# Patient Record
Sex: Female | Born: 1945 | Race: White | Hispanic: No | Smoking: Former smoker
Health system: Southern US, Community
[De-identification: ages and names within clinical notes are randomized; demographics above are authoritative.]

## PROBLEM LIST (undated history)

## (undated) DIAGNOSIS — A0471 Enterocolitis due to Clostridium difficile, recurrent: Secondary | ICD-10-CM

## (undated) DIAGNOSIS — E119 Type 2 diabetes mellitus without complications: Secondary | ICD-10-CM

## (undated) DIAGNOSIS — I82409 Acute embolism and thrombosis of unspecified deep veins of unspecified lower extremity: Secondary | ICD-10-CM

## (undated) DIAGNOSIS — Z8701 Personal history of pneumonia (recurrent): Secondary | ICD-10-CM

## (undated) DIAGNOSIS — J449 Chronic obstructive pulmonary disease, unspecified: Secondary | ICD-10-CM

## (undated) DIAGNOSIS — K56609 Unspecified intestinal obstruction, unspecified as to partial versus complete obstruction: Secondary | ICD-10-CM

## (undated) DIAGNOSIS — D649 Anemia, unspecified: Secondary | ICD-10-CM

## (undated) DIAGNOSIS — J9620 Acute and chronic respiratory failure, unspecified whether with hypoxia or hypercapnia: Secondary | ICD-10-CM

## (undated) DIAGNOSIS — F329 Major depressive disorder, single episode, unspecified: Secondary | ICD-10-CM

## (undated) DIAGNOSIS — E43 Unspecified severe protein-calorie malnutrition: Secondary | ICD-10-CM

## (undated) DIAGNOSIS — F0390 Unspecified dementia without behavioral disturbance: Secondary | ICD-10-CM

## (undated) DIAGNOSIS — G8929 Other chronic pain: Secondary | ICD-10-CM

## (undated) DIAGNOSIS — F32A Depression, unspecified: Secondary | ICD-10-CM

## (undated) DIAGNOSIS — F419 Anxiety disorder, unspecified: Secondary | ICD-10-CM

## (undated) HISTORY — PX: BOWEL RESECTION: SHX1257

## (undated) HISTORY — PX: VENTRAL HERNIA REPAIR: SHX424

## (undated) HISTORY — PX: OTHER SURGICAL HISTORY: SHX169

---

## 2015-06-24 HISTORY — PX: INCISIONAL HERNIA REPAIR: SHX193

## 2015-06-24 HISTORY — PX: HEMICOLECTOMY: SHX854

## 2015-07-14 HISTORY — PX: PEG TUBE PLACEMENT: SUR1034

## 2015-07-14 HISTORY — PX: TRACHEOSTOMY: SUR1362

## 2015-09-26 ENCOUNTER — Inpatient Hospital Stay (HOSPITAL_COMMUNITY)
Admission: EM | Admit: 2015-09-26 | Discharge: 2015-10-01 | DRG: 870 | Disposition: A | Discharge status: Other Acute Inpatient Hospital | Payer: Medicare Other | Attending: Internal Medicine | Admitting: Internal Medicine

## 2015-09-26 ENCOUNTER — Encounter (HOSPITAL_COMMUNITY): Payer: Self-pay | Admitting: Emergency Medicine

## 2015-09-26 ENCOUNTER — Emergency Department (HOSPITAL_COMMUNITY): Payer: Medicare Other

## 2015-09-26 DIAGNOSIS — K5669 Other intestinal obstruction: Secondary | ICD-10-CM | POA: Diagnosis present

## 2015-09-26 DIAGNOSIS — L89154 Pressure ulcer of sacral region, stage 4: Secondary | ICD-10-CM

## 2015-09-26 DIAGNOSIS — R7881 Bacteremia: Secondary | ICD-10-CM | POA: Diagnosis not present

## 2015-09-26 DIAGNOSIS — B957 Other staphylococcus as the cause of diseases classified elsewhere: Secondary | ICD-10-CM | POA: Diagnosis not present

## 2015-09-26 DIAGNOSIS — E119 Type 2 diabetes mellitus without complications: Secondary | ICD-10-CM | POA: Diagnosis present

## 2015-09-26 DIAGNOSIS — J9612 Chronic respiratory failure with hypercapnia: Secondary | ICD-10-CM | POA: Diagnosis not present

## 2015-09-26 DIAGNOSIS — B965 Pseudomonas (aeruginosa) (mallei) (pseudomallei) as the cause of diseases classified elsewhere: Secondary | ICD-10-CM | POA: Diagnosis present

## 2015-09-26 DIAGNOSIS — F418 Other specified anxiety disorders: Secondary | ICD-10-CM | POA: Diagnosis not present

## 2015-09-26 DIAGNOSIS — I10 Essential (primary) hypertension: Secondary | ICD-10-CM | POA: Diagnosis present

## 2015-09-26 DIAGNOSIS — R1310 Dysphagia, unspecified: Secondary | ICD-10-CM

## 2015-09-26 DIAGNOSIS — N139 Obstructive and reflux uropathy, unspecified: Secondary | ICD-10-CM | POA: Diagnosis present

## 2015-09-26 DIAGNOSIS — Z931 Gastrostomy status: Secondary | ICD-10-CM | POA: Diagnosis not present

## 2015-09-26 DIAGNOSIS — J449 Chronic obstructive pulmonary disease, unspecified: Secondary | ICD-10-CM | POA: Diagnosis present

## 2015-09-26 DIAGNOSIS — Z87891 Personal history of nicotine dependence: Secondary | ICD-10-CM | POA: Diagnosis not present

## 2015-09-26 DIAGNOSIS — D638 Anemia in other chronic diseases classified elsewhere: Secondary | ICD-10-CM | POA: Diagnosis not present

## 2015-09-26 DIAGNOSIS — J440 Chronic obstructive pulmonary disease with acute lower respiratory infection: Secondary | ICD-10-CM | POA: Diagnosis not present

## 2015-09-26 DIAGNOSIS — Z885 Allergy status to narcotic agent status: Secondary | ICD-10-CM

## 2015-09-26 DIAGNOSIS — Z9911 Dependence on respirator [ventilator] status: Secondary | ICD-10-CM

## 2015-09-26 DIAGNOSIS — J9621 Acute and chronic respiratory failure with hypoxia: Secondary | ICD-10-CM | POA: Diagnosis not present

## 2015-09-26 DIAGNOSIS — G9349 Other encephalopathy: Secondary | ICD-10-CM | POA: Diagnosis present

## 2015-09-26 DIAGNOSIS — B3749 Other urogenital candidiasis: Secondary | ICD-10-CM | POA: Diagnosis present

## 2015-09-26 DIAGNOSIS — Z79899 Other long term (current) drug therapy: Secondary | ICD-10-CM | POA: Diagnosis not present

## 2015-09-26 DIAGNOSIS — Z681 Body mass index (BMI) 19 or less, adult: Secondary | ICD-10-CM | POA: Diagnosis not present

## 2015-09-26 DIAGNOSIS — K5660 Unspecified intestinal obstruction: Secondary | ICD-10-CM | POA: Diagnosis present

## 2015-09-26 DIAGNOSIS — E876 Hypokalemia: Secondary | ICD-10-CM | POA: Diagnosis not present

## 2015-09-26 DIAGNOSIS — Z87440 Personal history of urinary (tract) infections: Secondary | ICD-10-CM

## 2015-09-26 DIAGNOSIS — G8929 Other chronic pain: Secondary | ICD-10-CM | POA: Diagnosis present

## 2015-09-26 DIAGNOSIS — K567 Ileus, unspecified: Secondary | ICD-10-CM | POA: Diagnosis not present

## 2015-09-26 DIAGNOSIS — F1120 Opioid dependence, uncomplicated: Secondary | ICD-10-CM | POA: Diagnosis not present

## 2015-09-26 DIAGNOSIS — A047 Enterocolitis due to Clostridium difficile: Secondary | ICD-10-CM | POA: Diagnosis not present

## 2015-09-26 DIAGNOSIS — F0390 Unspecified dementia without behavioral disturbance: Secondary | ICD-10-CM | POA: Diagnosis present

## 2015-09-26 DIAGNOSIS — Z515 Encounter for palliative care: Secondary | ICD-10-CM | POA: Diagnosis not present

## 2015-09-26 DIAGNOSIS — A4150 Gram-negative sepsis, unspecified: Principal | ICD-10-CM | POA: Diagnosis present

## 2015-09-26 DIAGNOSIS — A419 Sepsis, unspecified organism: Secondary | ICD-10-CM

## 2015-09-26 DIAGNOSIS — Z794 Long term (current) use of insulin: Secondary | ICD-10-CM

## 2015-09-26 DIAGNOSIS — Z8614 Personal history of Methicillin resistant Staphylococcus aureus infection: Secondary | ICD-10-CM

## 2015-09-26 DIAGNOSIS — K56609 Unspecified intestinal obstruction, unspecified as to partial versus complete obstruction: Secondary | ICD-10-CM | POA: Diagnosis present

## 2015-09-26 DIAGNOSIS — E861 Hypovolemia: Secondary | ICD-10-CM | POA: Diagnosis present

## 2015-09-26 DIAGNOSIS — X58XXXA Exposure to other specified factors, initial encounter: Secondary | ICD-10-CM | POA: Diagnosis present

## 2015-09-26 DIAGNOSIS — E43 Unspecified severe protein-calorie malnutrition: Secondary | ICD-10-CM | POA: Diagnosis present

## 2015-09-26 DIAGNOSIS — R6520 Severe sepsis without septic shock: Secondary | ICD-10-CM | POA: Diagnosis not present

## 2015-09-26 DIAGNOSIS — E872 Acidosis: Secondary | ICD-10-CM | POA: Diagnosis present

## 2015-09-26 DIAGNOSIS — J9620 Acute and chronic respiratory failure, unspecified whether with hypoxia or hypercapnia: Secondary | ICD-10-CM

## 2015-09-26 DIAGNOSIS — J690 Pneumonitis due to inhalation of food and vomit: Secondary | ICD-10-CM | POA: Diagnosis present

## 2015-09-26 DIAGNOSIS — N1339 Other hydronephrosis: Secondary | ICD-10-CM | POA: Diagnosis not present

## 2015-09-26 DIAGNOSIS — D649 Anemia, unspecified: Secondary | ICD-10-CM | POA: Diagnosis not present

## 2015-09-26 DIAGNOSIS — G9340 Encephalopathy, unspecified: Secondary | ICD-10-CM | POA: Diagnosis not present

## 2015-09-26 DIAGNOSIS — R112 Nausea with vomiting, unspecified: Secondary | ICD-10-CM

## 2015-09-26 DIAGNOSIS — Z8719 Personal history of other diseases of the digestive system: Secondary | ICD-10-CM | POA: Diagnosis not present

## 2015-09-26 DIAGNOSIS — Z930 Tracheostomy status: Secondary | ICD-10-CM | POA: Diagnosis not present

## 2015-09-26 DIAGNOSIS — Z86718 Personal history of other venous thrombosis and embolism: Secondary | ICD-10-CM

## 2015-09-26 DIAGNOSIS — T17990A Other foreign object in respiratory tract, part unspecified in causing asphyxiation, initial encounter: Secondary | ICD-10-CM | POA: Diagnosis not present

## 2015-09-26 DIAGNOSIS — N179 Acute kidney failure, unspecified: Secondary | ICD-10-CM

## 2015-09-26 DIAGNOSIS — N1330 Unspecified hydronephrosis: Secondary | ICD-10-CM | POA: Diagnosis not present

## 2015-09-26 DIAGNOSIS — Z8701 Personal history of pneumonia (recurrent): Secondary | ICD-10-CM | POA: Diagnosis not present

## 2015-09-26 DIAGNOSIS — I959 Hypotension, unspecified: Secondary | ICD-10-CM | POA: Diagnosis not present

## 2015-09-26 DIAGNOSIS — J9611 Chronic respiratory failure with hypoxia: Secondary | ICD-10-CM

## 2015-09-26 DIAGNOSIS — L8990 Pressure ulcer of unspecified site, unspecified stage: Secondary | ICD-10-CM | POA: Insufficient documentation

## 2015-09-26 DIAGNOSIS — K2210 Ulcer of esophagus without bleeding: Secondary | ICD-10-CM | POA: Diagnosis present

## 2015-09-26 DIAGNOSIS — R64 Cachexia: Secondary | ICD-10-CM | POA: Diagnosis present

## 2015-09-26 DIAGNOSIS — Z4659 Encounter for fitting and adjustment of other gastrointestinal appliance and device: Secondary | ICD-10-CM

## 2015-09-26 HISTORY — DX: Other chronic pain: G89.29

## 2015-09-26 HISTORY — DX: Acute embolism and thrombosis of unspecified deep veins of unspecified lower extremity: I82.409

## 2015-09-26 HISTORY — DX: Unspecified severe protein-calorie malnutrition: E43

## 2015-09-26 HISTORY — DX: Chronic obstructive pulmonary disease, unspecified: J44.9

## 2015-09-26 HISTORY — DX: Depression, unspecified: F32.A

## 2015-09-26 HISTORY — DX: Enterocolitis due to Clostridium difficile, recurrent: A04.71

## 2015-09-26 HISTORY — DX: Major depressive disorder, single episode, unspecified: F32.9

## 2015-09-26 HISTORY — DX: Personal history of pneumonia (recurrent): Z87.01

## 2015-09-26 HISTORY — DX: Unspecified intestinal obstruction, unspecified as to partial versus complete obstruction: K56.609

## 2015-09-26 HISTORY — DX: Acute and chronic respiratory failure, unspecified whether with hypoxia or hypercapnia: J96.20

## 2015-09-26 HISTORY — DX: Type 2 diabetes mellitus without complications: E11.9

## 2015-09-26 HISTORY — DX: Unspecified dementia, unspecified severity, without behavioral disturbance, psychotic disturbance, mood disturbance, and anxiety: F03.90

## 2015-09-26 HISTORY — DX: Anxiety disorder, unspecified: F41.9

## 2015-09-26 HISTORY — DX: Anemia, unspecified: D64.9

## 2015-09-26 LAB — COMPREHENSIVE METABOLIC PANEL
ALT: 17 U/L (ref 14–54)
ANION GAP: 17 — AB (ref 5–15)
AST: 30 U/L (ref 15–41)
Albumin: 3.2 g/dL — ABNORMAL LOW (ref 3.5–5.0)
Alkaline Phosphatase: 128 U/L — ABNORMAL HIGH (ref 38–126)
BILIRUBIN TOTAL: 0.5 mg/dL (ref 0.3–1.2)
BUN: 65 mg/dL — ABNORMAL HIGH (ref 6–20)
CHLORIDE: 93 mmol/L — AB (ref 101–111)
CO2: 30 mmol/L (ref 22–32)
Calcium: 8.8 mg/dL — ABNORMAL LOW (ref 8.9–10.3)
Creatinine, Ser: 1.43 mg/dL — ABNORMAL HIGH (ref 0.44–1.00)
GFR calc Af Amer: 42 mL/min — ABNORMAL LOW (ref >60)
GFR, EST NON AFRICAN AMERICAN: 36 mL/min — AB (ref >60)
Glucose, Bld: 198 mg/dL — ABNORMAL HIGH (ref 65–99)
POTASSIUM: 3.6 mmol/L (ref 3.5–5.1)
Sodium: 140 mmol/L (ref 135–145)
TOTAL PROTEIN: 6.8 g/dL (ref 6.5–8.1)

## 2015-09-26 LAB — BLOOD GAS, ARTERIAL
Acid-Base Excess: 6.2 mmol/L — ABNORMAL HIGH (ref 0.0–2.0)
BICARBONATE: 29.9 meq/L — AB (ref 20.0–24.0)
Drawn by: 441261
FIO2: 0.5
O2 Saturation: 90.2 %
PATIENT TEMPERATURE: 98.6
PCO2 ART: 40.4 mmHg (ref 35.0–45.0)
PEEP: 8 cmH2O
PO2 ART: 64.4 mmHg — AB (ref 80.0–100.0)
RATE: 20 resp/min
TCO2: 27.5 mmol/L (ref 0–100)
VT: 450 mL
pH, Arterial: 7.482 — ABNORMAL HIGH (ref 7.350–7.450)

## 2015-09-26 LAB — CBC
HCT: 30.9 % — ABNORMAL LOW (ref 36.0–46.0)
Hemoglobin: 9.7 g/dL — ABNORMAL LOW (ref 12.0–15.0)
MCH: 26.2 pg (ref 26.0–34.0)
MCHC: 31.4 g/dL (ref 30.0–36.0)
MCV: 83.5 fL (ref 78.0–100.0)
PLATELETS: 474 10*3/uL — AB (ref 150–400)
RBC: 3.7 MIL/uL — AB (ref 3.87–5.11)
RDW: 16.3 % — ABNORMAL HIGH (ref 11.5–15.5)
WBC: 29.3 10*3/uL — AB (ref 4.0–10.5)

## 2015-09-26 LAB — CBC WITH DIFFERENTIAL/PLATELET
BASOS ABS: 0 10*3/uL (ref 0.0–0.1)
Basophils Relative: 0 %
Eosinophils Absolute: 0 10*3/uL (ref 0.0–0.7)
Eosinophils Relative: 0 %
HEMATOCRIT: 32.1 % — AB (ref 36.0–46.0)
HEMOGLOBIN: 10 g/dL — AB (ref 12.0–15.0)
LYMPHS PCT: 5 %
Lymphs Abs: 1.3 10*3/uL (ref 0.7–4.0)
MCH: 26.1 pg (ref 26.0–34.0)
MCHC: 31.2 g/dL (ref 30.0–36.0)
MCV: 83.8 fL (ref 78.0–100.0)
Monocytes Absolute: 2.2 10*3/uL — ABNORMAL HIGH (ref 0.1–1.0)
Monocytes Relative: 10 %
NEUTROS ABS: 20.1 10*3/uL — AB (ref 1.7–7.7)
NEUTROS PCT: 85 %
PLATELETS: 568 10*3/uL — AB (ref 150–400)
RBC: 3.83 MIL/uL — AB (ref 3.87–5.11)
RDW: 16 % — ABNORMAL HIGH (ref 11.5–15.5)
WBC: 23.6 10*3/uL — AB (ref 4.0–10.5)

## 2015-09-26 LAB — TROPONIN I: Troponin I: 0.06 ng/mL — ABNORMAL HIGH (ref <0.031)

## 2015-09-26 LAB — PROTIME-INR
INR: 1.2 (ref 0.00–1.49)
Prothrombin Time: 14.9 seconds (ref 11.6–15.2)

## 2015-09-26 LAB — URINE MICROSCOPIC-ADD ON: Squamous Epithelial / LPF: NONE SEEN

## 2015-09-26 LAB — URINALYSIS, ROUTINE W REFLEX MICROSCOPIC
BILIRUBIN URINE: NEGATIVE
Glucose, UA: NEGATIVE mg/dL
Hgb urine dipstick: NEGATIVE
KETONES UR: NEGATIVE mg/dL
NITRITE: NEGATIVE
PH: 5 (ref 5.0–8.0)
Protein, ur: 100 mg/dL — AB
Specific Gravity, Urine: 1.021 (ref 1.005–1.030)

## 2015-09-26 LAB — LACTIC ACID, PLASMA: Lactic Acid, Venous: 2.1 mmol/L (ref 0.5–2.0)

## 2015-09-26 LAB — I-STAT CG4 LACTIC ACID, ED: LACTIC ACID, VENOUS: 3.92 mmol/L — AB (ref 0.5–2.0)

## 2015-09-26 LAB — OCCULT BLOOD GASTRIC / DUODENUM (SPECIMEN CUP): Occult Blood, Gastric: NEGATIVE

## 2015-09-26 LAB — MRSA PCR SCREENING: MRSA by PCR: POSITIVE — AB

## 2015-09-26 MED ORDER — IPRATROPIUM-ALBUTEROL 0.5-2.5 (3) MG/3ML IN SOLN
3.0000 mL | Freq: Four times a day (QID) | RESPIRATORY_TRACT | Status: DC
  Administered 2015-09-26 – 2015-10-01 (×21): 3 mL via RESPIRATORY_TRACT
  Filled 2015-09-26 (×21): qty 3

## 2015-09-26 MED ORDER — SODIUM CHLORIDE 0.9 % IV SOLN: 250.0000 mL | INTRAVENOUS | Status: DC | PRN

## 2015-09-26 MED ORDER — SODIUM CHLORIDE 0.9 % IV SOLN
1000.0000 mL | Freq: Once | INTRAVENOUS | Status: AC
  Administered 2015-09-26: 1000 mL via INTRAVENOUS

## 2015-09-26 MED ORDER — SODIUM CHLORIDE 0.9 % IV SOLN
INTRAVENOUS | Status: DC
Start: 2015-09-27 — End: 2015-09-27
  Administered 2015-09-27: 06:00:00 via INTRAVENOUS

## 2015-09-26 MED ORDER — IOHEXOL 300 MG/ML  SOLN
50.0000 mL | Freq: Once | INTRAMUSCULAR | Status: AC | PRN
  Administered 2015-09-26: 50 mL via INTRAVENOUS

## 2015-09-26 MED ORDER — PANTOPRAZOLE SODIUM 40 MG IV SOLR: 40.0000 mg | Freq: Two times a day (BID) | INTRAVENOUS | Status: DC

## 2015-09-26 MED ORDER — SODIUM CHLORIDE 0.9 % IV SOLN
INTRAVENOUS | Status: DC
  Administered 2015-09-26: 13:00:00 via INTRAVENOUS

## 2015-09-26 MED ORDER — CHLORHEXIDINE GLUCONATE CLOTH 2 % EX PADS
6.0000 | MEDICATED_PAD | Freq: Every day | CUTANEOUS | Status: AC
  Administered 2015-09-27 – 2015-10-01 (×5): 6 via TOPICAL

## 2015-09-26 MED ORDER — ACETAMINOPHEN 325 MG PO TABS: 650.0000 mg | ORAL_TABLET | ORAL | Status: DC | PRN

## 2015-09-26 MED ORDER — VANCOMYCIN HCL IN DEXTROSE 1-5 GM/200ML-% IV SOLN
1000.0000 mg | Freq: Once | INTRAVENOUS | Status: AC
  Administered 2015-09-26: 1000 mg via INTRAVENOUS
  Filled 2015-09-26: qty 200

## 2015-09-26 MED ORDER — ANTISEPTIC ORAL RINSE SOLUTION (CORINZ)
7.0000 mL | Freq: Four times a day (QID) | OROMUCOSAL | Status: DC
  Administered 2015-09-26 – 2015-09-30 (×15): 7 mL via OROMUCOSAL

## 2015-09-26 MED ORDER — SODIUM CHLORIDE 0.9 % IV SOLN
8.0000 mg/h | INTRAVENOUS | Status: DC
  Administered 2015-09-26 – 2015-09-27 (×3): 8 mg/h via INTRAVENOUS
  Filled 2015-09-26 (×5): qty 80

## 2015-09-26 MED ORDER — FENTANYL CITRATE (PF) 100 MCG/2ML IJ SOLN
50.0000 ug | INTRAMUSCULAR | Status: DC | PRN
  Administered 2015-09-26: 50 ug via INTRAVENOUS
  Filled 2015-09-26: qty 2

## 2015-09-26 MED ORDER — ONDANSETRON HCL 4 MG/2ML IJ SOLN: 4.0000 mg | Freq: Four times a day (QID) | INTRAMUSCULAR | Status: DC | PRN

## 2015-09-26 MED ORDER — VANCOMYCIN HCL 500 MG IV SOLR
500.0000 mg | Freq: Four times a day (QID) | Status: DC
  Administered 2015-09-26 – 2015-09-30 (×15): 500 mg via RECTAL
  Filled 2015-09-26 (×24): qty 500

## 2015-09-26 MED ORDER — SODIUM CHLORIDE 0.9 % IV BOLUS (SEPSIS)
30.0000 mL/kg | Freq: Once | INTRAVENOUS | Status: AC
  Administered 2015-09-26: 1374 mL via INTRAVENOUS

## 2015-09-26 MED ORDER — PIPERACILLIN-TAZOBACTAM 3.375 G IVPB 30 MIN
3.3750 g | Freq: Once | INTRAVENOUS | Status: AC
  Administered 2015-09-26: 3.375 g via INTRAVENOUS
  Filled 2015-09-26: qty 50

## 2015-09-26 MED ORDER — VANCOMYCIN HCL 500 MG IV SOLR
500.0000 mg | INTRAVENOUS | Status: DC
  Administered 2015-09-27 – 2015-09-29 (×3): 500 mg via INTRAVENOUS
  Filled 2015-09-26 (×3): qty 500

## 2015-09-26 MED ORDER — CHLORHEXIDINE GLUCONATE 0.12% ORAL RINSE (MEDLINE KIT)
15.0000 mL | Freq: Two times a day (BID) | OROMUCOSAL | Status: DC
  Administered 2015-09-26 – 2015-09-30 (×9): 15 mL via OROMUCOSAL

## 2015-09-26 MED ORDER — PIPERACILLIN-TAZOBACTAM 3.375 G IVPB
3.3750 g | Freq: Three times a day (TID) | INTRAVENOUS | Status: DC
  Administered 2015-09-26 – 2015-09-28 (×6): 3.375 g via INTRAVENOUS
  Filled 2015-09-26 (×6): qty 50

## 2015-09-26 MED ORDER — FENTANYL CITRATE (PF) 100 MCG/2ML IJ SOLN: 50.0000 ug | INTRAMUSCULAR | Status: DC | PRN

## 2015-09-26 MED ORDER — VANCOMYCIN 50 MG/ML ORAL SOLUTION
500.0000 mg | Freq: Four times a day (QID) | ORAL | Status: DC
  Filled 2015-09-26 (×4): qty 10

## 2015-09-26 MED ORDER — MUPIROCIN 2 % EX OINT
1.0000 "application " | TOPICAL_OINTMENT | Freq: Two times a day (BID) | CUTANEOUS | Status: AC
  Administered 2015-09-26 – 2015-10-01 (×10): 1 via NASAL
  Filled 2015-09-26: qty 22

## 2015-09-26 MED ORDER — SODIUM CHLORIDE 0.9 % IV SOLN
1000.0000 mL | INTRAVENOUS | Status: DC
  Administered 2015-09-26: 1000 mL via INTRAVENOUS

## 2015-09-26 MED ORDER — FENTANYL CITRATE (PF) 100 MCG/2ML IJ SOLN: 100.0000 ug | Freq: Once | INTRAMUSCULAR | Status: DC

## 2015-09-26 MED ORDER — DIGOXIN 125 MCG PO TABS
0.1250 mg | ORAL_TABLET | Freq: Every day | ORAL | Status: DC
  Administered 2015-09-28: 0.125 mg via ORAL
  Filled 2015-09-26 (×2): qty 1

## 2015-09-26 MED ORDER — SODIUM CHLORIDE 0.9 % IV SOLN
80.0000 mg | Freq: Once | INTRAVENOUS | Status: AC
  Administered 2015-09-26: 80 mg via INTRAVENOUS
  Filled 2015-09-26: qty 80

## 2015-09-26 NOTE — Progress Notes (Signed)
[facility] PCCM  CT results reviewed, likely small bowel obstruction.  Plan  PEG to suction Vanc enema Surgery consult  Heber Bearcreek, MD [place] PCCM Pager: [phone] Cell: [phone] After 3pm or if no response, call [phone]

## 2015-09-26 NOTE — Progress Notes (Signed)
Patient brought to [facility] ED by carelink from [facility]. Patient has a hx of COPD and is on scheduled Duoneb Q6 at facility. Patient has a Shiley #8 cuffed trach and is chronically on a vent. Patient had 2.5 L suctioned from g-tube and a small amount suctioned orally; fluid was coffee brown in nature. BBS were diminished and nothing was obtained via tracheal suctioning. Patient is currently on PRVC 450/20/+8/50%; current settings based on patient's current settings from [facility]. FiO2 was initially 55%, but was reduced due to O2 saturations of 98-100%. Patient is currently 96-98% on FiO2 of 50%. FiO2 to be weaned as tolerated by patient. Patient additionally placed on scheduled breathing treatments Q6 per facility regimen. Patient is currently comfortable on vent. RT will continue to monitor patient.

## 2015-09-26 NOTE — Consult Note (Signed)
WOC wound consult note Reason for Consult:Stage 4 pressure injury to sacrum.  Present on admission from [facility].Dark brown, foul smelling effluent from Gastrostomy tube.  Wound type: Stage 4 pressure injury Pressure Ulcer POA: Yes Measurement: 5.2 cm x 5 cm x 2.5 cm bone palpable Wound ZOX:WRUE pink nongranulating Drainage (amount, consistency, odor)moderate serosanguinous drainage.   Periwound:Intact Dressing procedure/placement/frequency:Cleanse wound with NS and pat gently dry. Gently fill wound bed with calcium alginate to fill dead space and absorb drainage.  [facility] [place] [phone]).  Cover with silicone border foam dressing.  May peel back foam dressing and replace calcium alginate daily. Replace sacral dressing every 3 days and PRN soilage.  Turn and reposition every 2 hours.  Will not follow at this time.  Please re-consult if needed.  Maple Hudson RN BSN [facility]ON Pager [phone]

## 2015-09-26 NOTE — ED Notes (Signed)
Bed: WA17 Expected date:  Expected time:  Means of arrival:  Comments: Vented pt from [facility]

## 2015-09-26 NOTE — Clinical Social Work Note (Signed)
Clinical Social Work Assessment  Patient Details  Name: Brenley Priore MRN: [id number] Date of Birth: 14-Aug-1945  Date of referral:  09/26/15               Reason for consult:  Other (Comment Required) (Patient from [facility])                Permission sought to share information with:    Permission granted to share information::     Name::        Agency::     Relationship::     Contact Information:     Housing/Transportation Living arrangements for the past 2 months:    Source of Information:    Patient Interpreter Needed:    Criminal Activity/Legal Involvement Pertinent to Current Situation/Hospitalization:    Significant Relationships:  Other(Comment) (Unknown) Lives with:    Do you feel safe going back to the place where you live?    Need for family participation in patient care:     Care giving concerns: Unknown at this time   Office manager / plan: CSW did not assess patient.   Employment status:    Insurance information:  Medicare PT Recommendations:  Not assessed at this time Information / Referral to community resources:  Other (Comment Required) (None given at this time)  Patient/Family's Response to care: Unknown at this time  Patient/Family's Understanding of and Emotional Response to Diagnosis, Current Treatment, and Prognosis: Unknown at this time  Emotional Assessment Appearance:    Attitude/Demeanor/Rapport:    Affect (typically observed):    Orientation:    Alcohol / Substance use:  Not Applicable Psych involvement (Current and /or in the community):  No (Comment)  Discharge Needs  Concerns to be addressed:    Readmission within the last 30 days:    Current discharge risk:    Barriers to Discharge:      Claudean Severance, LCSW 09/26/2015, 2:12 PM

## 2015-09-26 NOTE — Progress Notes (Signed)
eLink Physician-Brief Progress Note Patient Name: Kristen Arnold DOB: 1945-08-07 MRN: [id number]   Date of Service  09/26/2015  HPI/Events of Note  RN called regarding pt's decreasing uo. Pt has a lot of output per peg. 109/63, HR 110.   eICU Interventions  Will increase IVF to NS 150/hr x 1 L then go back to 100 ml/hr. RN to call back if uo does not get better or pt with congestion. May need bolus.      Intervention Category Major Interventions: Hypovolemia - evaluation and treatment with fluids  Louann Sjogren 09/26/2015, 7:49 PM

## 2015-09-26 NOTE — Progress Notes (Signed)
Per Dr. Nickola Major, do not place an NG/OG tube. Use existing G-tube for intermittent low wall suction.

## 2015-09-26 NOTE — Progress Notes (Signed)
RN received order from MD to place an NG tube; RN attempted to place tube but was unsuccessful. Broadus John, charge RN, attempted to place NG tube. Patient could not follow commands to swallow or bend neck forward. NG tube went into trachea instead of esophagus and was stuck. Respiratory was called, the tracheostomy cuff was deflated, and the tube was removed. RN called Dr Deterding to report failure to place NG tube. RN will continue to monitor patient.

## 2015-09-26 NOTE — H&P (Addendum)
PULMONARY / CRITICAL CARE MEDICINE   Name: Kristen Arnold MRN: [id number] DOB: 03-Jun-1946    ADMISSION DATE:  09/26/2015 CONSULTATION DATE:  09/26/2015  REFERRING MD:  Patria Mane, EDP  CHIEF COMPLAINT:  Sent from facility for aspiration and concern for bowel obstruction  HISTORY OF PRESENT ILLNESS:   70 year old female with COPD and narcotic dependence was brought to the [facility] emergency department on 09/26/2015 asked her she was found to have presumed aspiration pneumonia in the setting of a possible small bowel obstruction.  She was just admitted to [facility] on 09/22/2015 after a prolonged hospitalization. In December 2016 she was admitted from an emergency department with findings of a bowel obstruction, renal failure, and lactic acidosis. She underwent a right hemicolectomy in the setting of bowel obstruction which was felt to be related to adhesions from a prior hernia repair. Postoperatively she was unable to be weaned from a ventilator so ultimately a tracheostomy was performed. She's had several infectious complications including an enterococcus UTI, MRSA pneumonia, Pseudomonas pneumonia, and Pseudomonas related wound infection, and C. difficile. She was transferred to [facility] this week in the setting of prolonged mechanical ventilation.  On the morning of 09/26/2015 she was found to have significant brown secretions from her mouth and it was felt that she had aspiration pneumonia. A chest x-ray showed findings worrisome for pneumonia. A KUB apparently showed findings concerning for a small bowel obstruction.  In the [facility] emergency department she was noted to have brown, coffee ground bili is secretions from her G-tube. Of note, she was found to have erosive esophagitis in one of the health care facilities that cared for her prior to [facility].  She was initially hypotensive in the [facility] emergency department but she has responded to IV  fluids. Pulmonary and critical care medicine was consulted for admission.  PAST MEDICAL HISTORY :  She  has a past medical history of Bowel obstruction (HCC); Anemia; COPD (chronic obstructive pulmonary disease) (HCC); Dementia; Acute on chronic respiratory failure (HCC); Anxiety; Depression; Diabetes mellitus without complication (HCC); DVT (deep venous thrombosis) (HCC); Recurrent Clostridium difficile diarrhea; History of Pseudomonas pneumonia; and Severe protein-calorie malnutrition (HCC).  PAST SURGICAL HISTORY: She  has past surgical history that includes Tracheostomy; PEG tube placement; Bowel resection; Colon surgery; and bronchoscopies.  Allergies  Allergen Reactions  . Codeine     Unknown reaction, listed on MAR    No current facility-administered medications on file prior to encounter.   No current outpatient prescriptions on file prior to encounter.    FAMILY HISTORY/SOCIAL HISTORY/REVIEW OF SYSTEMS:   Cannot obtain because the patient is encephalopathic  SUBJECTIVE:  As above   VITAL SIGNS: BP 121/100 mmHg  Pulse 113  Temp(Src) 100.3 F (37.9 C) (Rectal)  Resp 24  Wt 101 lb (45.813 kg)  SpO2 96%  HEMODYNAMICS:    VENTILATOR SETTINGS: Vent Mode:  [-] PRVC FiO2 (%):  [55 %] 55 % Set Rate:  [20 bmp] 20 bmp Vt Set:  [450 mL] 450 mL PEEP:  [8 cmH20] 8 cmH20 Plateau Pressure:  [19 cmH20] 19 cmH20  INTAKE / OUTPUT:    PHYSICAL EXAMINATION: General:  Chronically ill appearing, on vent Neuro:  Withdraws to pain, opens eyes to pain, otherwise somnolent HEENT:  NCAT, Trach site clean, dry Cardiovascular:  RRR, no mgr Lungs:  Vent supported breaths, normal air entry Abdomen:  Tender to palpation (some guarding), BS+, PEG in place, R surgical scar axillary line well healed Musculoskeletal:  Diminished bulk, tone Skin:  Stage IV sacral decub  LABS:  BMET  Recent Labs Lab 09/26/15 1030  NA 140  K 3.6  CL 93*  CO2 30  BUN 65*  CREATININE 1.43*   GLUCOSE 198*    Electrolytes  Recent Labs Lab 09/26/15 1030  CALCIUM 8.8*    CBC  Recent Labs Lab 09/26/15 1030  WBC 23.6*  HGB 10.0*  HCT 32.1*  PLT 568*    Coag's  Recent Labs Lab 09/26/15 1030  INR 1.20    Sepsis Markers  Recent Labs Lab 09/26/15 1049  LATICACIDVEN 3.92*    ABG No results for input(s): PHART, PCO2ART, PO2ART in the last 168 hours.  Liver Enzymes  Recent Labs Lab 09/26/15 1030  AST 30  ALT 17  ALKPHOS 128*  BILITOT 0.5  ALBUMIN 3.2*    Cardiac Enzymes  Recent Labs Lab 09/26/15 1030  TROPONINI 0.06*    Glucose No results for input(s): GLUCAP in the last 168 hours.  Imaging Dg Chest Portable 1 View  09/26/2015  CLINICAL DATA:  [facility]link transfer from [facility], trach dependent, hypotensive, unresponsive EXAM: PORTABLE CHEST - 1 VIEW COMPARISON:  None available FINDINGS: Tracheostomy device projects in expected location. Left arm PICC to distal SVC. Heart size normal. Coarse prominent interstitial markings throughout both lungs. Focal left retrocardiac airspace opacity. Mild blunting of lateral costophrenic angles suggesting small effusions versus pleural scarring. No pneumothorax. Visualized skeletal structures are unremarkable. IMPRESSION: 1. Mild diffuse interstitial prominence of uncertain chronicity. 2. Left retrocardiac consolidation/atelectasis/scarring. 3. Question small pleural effusions versus pleural scarring. 4. Tracheostomy and PICC line in expected location. Electronically Signed   By: Corlis Leak M.D.   On: 09/26/2015 10:50     STUDIES:  09/26/2015 CT abdomen and pelvis  CULTURES: 2/24 Blood cultures>  February 24 urine cultures: February 24 respiratory culture:  ANTIBIOTICS: 2/24 Oral vanc >  2/24 Vanc IV >  2/24 Zosyn IV >   SIGNIFICANT EVENTS: 2/24 admission  LINES/TUBES: PICC left arm (date?) Trach (date?) PEG (date?)  DISCUSSION: 70 y/o female with COPD, and a recent prolonged hospitalization  from a large bowel obstruction leading to prolonged mechanical ventilator support and multiple infectious complications was admitted on 2/24 from [facility] with nausea/vomiting and likely aspiration.   ASSESSMENT / PLAN:  PULMONARY A: Aspiration pneumonia> Chronic respiratory failure, vent dependent COPD P:   Full vent support with setting from [facility] Trach care ABG now CXR PRN VAP prevention  CARDIOVASCULAR A:  Sinus tachycardia, no shock Lactic acidosis Sepsis P:  Tele monitoring Continue IVF Monitor lactic acid  RENAL A:   AKI? No baseline labs available P:   Monitor BMET and UOP Replace electrolytes as needed   GASTROINTESTINAL A:   Recent bowel obstruction (notes say small bowel but suspect large bowel based on R hemicolectomy) Nausea vomiting Possible GI bleed > no bright red blood and now HD stable, so doesn't appear brisk Known erosive esophagitis P:   PPI gtt NPO PEG care Hold tub feedings CT abdomen/pelvix May need surgery consult  HEMATOLOGIC A:   Leukocytosis Anemia P:  Monitor CBC in setting of possible bleed SCD for DVT prevention  INFECTIOUS A:   Aspiration pneumonia Stage IV sacral decub ulcer on admission C diff present on admission Recent MRSA pneumonia Recent Pseudomonas pneumonia Recent enterococcus UTI REcent pseudomonas wound infection (sacral) P:   Antibiotics as above WOC culture F/u culture  ENDOCRINE A:   No acute issues P:   Monitor glucose  NEUROLOGIC  A:   Acute encephalopathy> presumably due to infection Chronic narcotic dependence P:   RASS goal: 0 Remove fentanyl patch Use PAD protocol  FAMILY  - Updates: none bedside  - Inter-disciplinary family meet or Palliative Care meeting due by:  day 7  My cc time 39 minutes  Sepsis - Repeat Assessment  Performed at:    12:26 PM   Vitals     Blood pressure 121/100, pulse 113, temperature 100.3 F (37.9 C), temperature source Rectal, resp. rate 24,  weight 101 lb (45.813 kg), SpO2 96 %.  Heart:     Tachycardic  Lungs:    CTA  Capillary Refill:   <2 sec  Peripheral Pulse:   Radial pulse palpable  Skin:     Normal Color    Heber Oklee, MD [employer] PCCM Pager: [phone] Cell: [phone] After 3pm or if no response, call [phone]   09/26/2015, 12:03 PM

## 2015-09-26 NOTE — Progress Notes (Signed)
eLink Physician-Brief Progress Note Patient Name: Kristen RoseLou Irigoyen[birth year] MRN: [id number]   Date of Service  09/26/2015  HPI/Events of Note  Nurse called to verify if PEG will be placed to suction and asked re: PO vanc.   eICU Interventions  Told nurse to hook PEG to continuous suction. Told her that since pt is distended (abd), we need to switch PO vanc to PR, instructed nurse to ask pharmacy re: dose of PR avnc vanc     Intervention Category Intermediate Interventions: Medication change / dose adjustment  Kitty Cadavid Bridgette Habermann Dios 09/26/2015, 4:53 PM

## 2015-09-26 NOTE — Progress Notes (Signed)
Pharmacy Antibiotic Note  Kristen Arnold is a 70 y.o. female admitted on 09/26/2015 with sepsis.  Pharmacy has been consulted for vancomycin/Zosyn dosing.    Patient presents to the emergency department today from [facility], LTAC, for possible bowel obstruction aspiration. She was found this morning to have brown vomit coming out of her tracheostomy site. She has been at [facility] since 09/22/2015. Review the chart demonstrates that she was hospitalized in December 2016 secondary to respiratory distress and was found to also have a small bowel obstruction at that time. During her hospitalizations at the prior LTAC she is noted to have C. difficile cA chest x-ray and a KUB obtained at the [facility] prior to transfer demonstrated aspiration pneumonia as small bowel obstruction. On arrival to the emergency department her gastrostomy tube was hooked up to suction and 2 and half liters of dark coffee-ground material was suctioned from her gastrostomy tube.   Plan:  Zosyn 3.375 gr IV q8h EI  Vancomycin 1000 mg IV x1, then vancomycin 500 mg IV q12h  Weight: 101 lb (45.813 kg)  Temp (24hrs), Avg:100.3 F (37.9 C), Min:100.3 F (37.9 C), Max:100.3 F (37.9 C)   Recent Labs Lab 09/26/15 1030 09/26/15 1049  WBC 23.6*  --   CREATININE 1.43*  --   LATICACIDVEN  --  3.92*    CrCl cannot be calculated (Unknown ideal weight.).    Allergies  Allergen Reactions  . Codeine     Unknown reaction, listed on MAR    Antimicrobials this admission: 2/24 Zosyn >>  2/24 Vancomycin >>   Dose adjustments this admission:   Microbiology results: 2/24 BCx:  2/24 Urine:    Thank you for allowing pharmacy to be a part of this patient's care.  Adalberto Cole, PharmD, BCPS Pager [phone] 09/26/2015 11:46 AM

## 2015-09-26 NOTE — Progress Notes (Signed)
Patient transported to CT on full support settings and FiO2 100%. Patient tolerated transport well and no complications noted during procedure. Post procedure, patient transported to ICU room 1229. Patient remains on chronic settings per facility and ABG to be drawn. ICU RN at bedside with patient. RT will continue to monitor patient.

## 2015-09-26 NOTE — Progress Notes (Signed)
CRITICAL VALUE ALERT  Critical value received:  Gram negative rods in aerobic blood culture bottle  Date of notification:  09/26/2015  Time of notification:  2107  Critical value read back:Yes.    Nurse who received alert:  Effie Berkshire  MD notified (1st page):  Deterding  Time of first page:  2108  MD notified (2nd page):  Time of second page:  Responding MD:  Deterding  Time MD responded:  2136

## 2015-09-26 NOTE — Progress Notes (Signed)
Initial Nutrition Assessment  DOCUMENTATION CODES:   Severe malnutrition in context of chronic illness, Underweight  INTERVENTION:  - Will monitor for POC and possibility for re-starting TF during admission - RD will continue to follow  NUTRITION DIAGNOSIS:   Inadequate oral intake related to inability to eat as evidenced by NPO status.  GOAL:   Patient will meet greater than or equal to 90% of their needs  MONITOR:   Vent status, Weight trends, Labs, Skin, I & O's  REASON FOR ASSESSMENT:   Ventilator  ASSESSMENT:   70 year old female with COPD and narcotic dependence was brought to the [facility] emergency department on 09/26/2015 asked her she was found to have presumed aspiration pneumonia in the setting of a possible small bowel obstruction. She was just admitted to [facility] on 09/22/2015 after a prolonged hospitalization. In December 2016 she was admitted from an emergency department with findings of a bowel obstruction, renal failure, and lactic acidosis. She underwent a right hemicolectomy in the setting of bowel obstruction which was felt to be related to adhesions from a prior hernia repair. Postoperatively she was unable to be weaned from a ventilator so ultimately a tracheostomy was performed. She's had several infectious complications including an enterococcus UTI, MRSA pneumonia, Pseudomonas pneumonia, and Pseudomonas related wound infection, and C. difficile. She was transferred to [facility] this week in the setting of prolonged mechanical ventilation.  Pt seen for new vent. Pt with PEG and was admitted from [facility] secondary to possible SBO. No height available in the chart. Per discussion with RN, estimated height of  (160.02 cm/1.6 m). This height was used to estimate needs and BMI. Estimated BMI indicates   Patient is currently intubated on ventilator support MV: 8.9 L/min Temp (24hrs), Avg:100.3 F (37.9 C), Min:100.3 F  (37.9 C), Max:100.3 F (37.9 C)  Propofol: none  No family/visitors at bedside to provide information. Spoke with RN who reports no return with suctioning trach site since arrival to the floor. PEG now hooked to suction and with residual drainage present in tubing but nothing in the canister. RN reports 2L drained from abdomen in ED. RN reports pt was given Fentanyl in ED but no sedation being provided at this time.   No previous weight available. Severe muscle and fat wasting visualized to upper and lower body.   Will monitor POC/GOC and findings as well as monitoring for ability to restart TF during this admission. Medications reviewed. Labs reviewed; Cl: 93 mmol/L, BUN/creatinine elevated, Ca: 8.8 mg/dL, GFR: 36 mg/dL.   Diet Order:  Diet NPO time specified  Skin:  Wound (see comment) (R buttocks skin tear)  Last BM:  PTA  Height:   Ht Readings from Last 1 Encounters:  No data found for Ht    Weight:   Wt Readings from Last 1 Encounters:  09/26/15 101 lb (45.813 kg)    Ideal Body Weight:  52.27 kg (kg)  BMI:  17.89 kg/m2  Estimated Nutritional Needs:   Kcal:  1307  Protein:  55-69 grams  Fluid:  1.5 L/day  EDUCATION NEEDS:   No education needs identified at this time     Trenton Gammon, RD, LDN Inpatient Clinical Dietitian Pager # [phone] After hours/weekend pager # [phone]

## 2015-09-26 NOTE — ED Notes (Signed)
carelink transfer from [facility], pt unresponsive, trach dependant, apparent tube feeding coming from trach, hypotensive, abd taut, distended, suctioned from g-tube on arrival

## 2015-09-26 NOTE — ED Provider Notes (Addendum)
CSN: [id number]     Arrival date & time 09/26/15  1012 History   First MD Initiated Contact with Patient 09/26/15 1027     Chief Complaint  Patient presents with  . Code Sepsis      HPI Patient presents to the emergency department today from [facility], for possible bowel obstruction and aspiration.  She was found this morning to have brown vomit coming out of her tracheostomy site.  She has been at [facility] since 09/22/2015.  Review the chart demonstrates that she was hospitalized in December 2016 secondary to respiratory distress and was found to also have a small bowel obstruction at that time.  She underwent right hemicolectomy and takedown of a previous hernia repair.  She was taken to a long-term care facility after they were unable to wean her from the vent.  She ultimately ended up with a tracheostomy and a gastrostomy tube.  During her hospitalizations at the prior LTAC she is noted to have C. difficile colitis and multiple respiratory tract infections as well as enterococcus and yeast UTI.  She is currently on the ventilator and she had been receiving tube feeds through her gastrostomy tube last night.  This was discontinued today.  A chest x-ray and a KUB obtained at the [facility] prior to transfer demonstrated aspiration pneumonia as small bowel obstruction.  On arrival to the emergency department her gastrostomy tube was hooked up to suction and 2 and half liters of dark coffee-ground material was suctioned from her gastrostomy tube.  She's had no blood pressure of 70/49 a rectal temp of 100.3 She was left on intermittent suction.  Start on protonix.  Given antibiotics and treat as possible sepsis.  She will undergo CT imaging.   Past Medical History  Diagnosis Date  . Bowel obstruction [facility])    Past Surgical History  Procedure Laterality Date  . Tracheostomy     No family history on file. Social History  Substance Use Topics  . Smoking status: Not on file  .  Smokeless tobacco: Not on file  . Alcohol Use: Not on file   OB History    No data available     Review of Systems  Unable to perform ROS: Patient nonverbal      Allergies  Codeine  Home Medications   Prior to Admission medications   Medication Sig Start Date End Date Taking? Authorizing Provider  acetaminophen (TYLENOL) 325 MG tablet 650 mg by PEG Tube route every 4 (four) hours as needed for moderate pain.   Yes Historical Provider, MD  acidophilus (RISAQUAD) CAPS capsule 1 capsule by PEG Tube route 3 (three) times daily.   Yes Historical Provider, MD  bisacodyl (DULCOLAX) 10 MG suppository Place 10 mg rectally every 3 (three) days as needed for moderate constipation.   Yes Historical Provider, MD  chlorhexidine (PERIDEX) 0.12 % solution Use as directed 15 mLs in the mouth or throat 2 (two) times daily.   Yes Historical Provider, MD  clonazePAM (KLONOPIN) 0.5 MG tablet 0.5 mg by PEG Tube route every 8 (eight) hours as needed for anxiety.   Yes Historical Provider, MD  digoxin (LANOXIN) 0.125 MG tablet 0.125 mg by PEG Tube route daily.   Yes Historical Provider, MD  diltiazem (CARDIZEM) 60 MG tablet 60 mg by PEG Tube route every 6 (six) hours.   Yes Historical Provider, MD  famotidine (PEPCID) 20 MG tablet 20 mg by PEG Tube route daily.   Yes Historical Provider, MD  fentaNYL (DURAGESIC - DOSED MCG/HR) 100 MCG/HR Place 100 mcg onto the skin every 3 (three) days.   Yes Historical Provider, MD  fentaNYL (DURAGESIC - DOSED MCG/HR) 25 MCG/HR patch Place 25 mcg onto the skin every 3 (three) days.   Yes Historical Provider, MD  glucagon (GLUCAGON EMERGENCY) 1 MG injection Inject 1 mg into the vein once as needed (for hypoglycemia).   Yes Historical Provider, MD  ibuprofen (ADVIL,MOTRIN) 600 MG tablet 600 mg by PEG Tube route every 6 (six) hours as needed for fever or moderate pain.   Yes Historical Provider, MD  insulin aspart (NOVOLOG) 100 UNIT/ML injection Inject 2-10 Units into the skin  3 (three) times daily as needed for high blood sugar. Per sliding scale: 151-200= 2 units; 201-250= 4 units; 251-300= 6 units; 301-350= 8 units; 351-400= 10 units, if above 401 call MD   Yes Historical Provider, MD  ipratropium-albuterol (DUONEB) 0.5-2.5 (3) MG/3ML SOLN Take 3 mLs by nebulization every 6 (six) hours.   Yes Historical Provider, MD  Lactulose 20 GM/30ML SOLN 30 mLs by PEG Tube route daily as needed (constipation).   Yes Historical Provider, MD  magnesium oxide (MAG-OX) 400 MG tablet 400 mg by PEG Tube route 3 (three) times daily.   Yes Historical Provider, MD  metoCLOPramide (REGLAN) 5 MG tablet 5 mg by PEG Tube route every 6 (six) hours.   Yes Historical Provider, MD  metoprolol tartrate (LOPRESSOR) 25 MG tablet 25 mg by PEG Tube route every 6 (six) hours. For tachycardia & hypertension, hold of SBP < 100, hold if HR less than 55   Yes Historical Provider, MD  pantoprazole (PROTONIX) 40 MG tablet 40 mg by PEG Tube route 2 (two) times daily.   Yes Historical Provider, MD  QUEtiapine (SEROQUEL) 100 MG tablet 150 mg by PEG Tube route 2 (two) times daily.   Yes Historical Provider, MD  tobramycin (NEBCIN) 10 MG/ML SOLN injection Inject 440 mg into the vein every 36 (thirty-six) hours. For respiratory infection until 10/02/15   Yes Historical Provider, MD  tuberculin 5 UNIT/0.1ML injection Inject 5 Units into the skin every 3 (three) days. For routine diagnostic until 10/10/15   Yes Historical Provider, MD  vancomycin (VANCOCIN) 125 MG capsule 125 mg by PEG Tube route every 6 (six) hours. For C-Diff   Yes Historical Provider, MD  vitamin C (ASCORBIC ACID) 500 MG tablet 500 mg by PEG Tube route daily.   Yes Historical Provider, MD   BP 70/49 mmHg  Pulse 103  Temp(Src) 100.3 F (37.9 C) (Rectal)  Resp 24  Wt 101 lb (45.813 kg)  SpO2 100% Physical Exam  Constitutional: She is oriented to person, place, and time. She appears well-developed and well-nourished. She appears distressed.  HENT:   Head: Normocephalic and atraumatic.  Eyes: EOM are normal.  Neck:  Brown contents coming from around tracheostomy site  Cardiovascular: Regular rhythm and normal heart sounds.   Tachy   Pulmonary/Chest: Effort normal and breath sounds normal.  Abdominal: Soft. She exhibits distension. There is no tenderness.  Musculoskeletal: Normal range of motion.  Neurological: She is alert and oriented to person, place, and time.  Skin: Skin is warm and dry.  Psychiatric: She has a normal mood and affect. Judgment normal.  Nursing note and vitals reviewed.   ED Course  Procedures (including critical care time)  CRITICAL CARE Performed by: Lyanne Co Total critical care time: 40 minutes Critical care time was exclusive of separately billable procedures and treating other  patients. Critical care was necessary to treat or prevent imminent or life-threatening deterioration. Critical care was time spent personally by me on the following activities: development of treatment plan with patient and/or surrogate as well as nursing, discussions with consultants, evaluation of patient's response to treatment, examination of patient, obtaining history from patient or surrogate, ordering and performing treatments and interventions, ordering and review of laboratory studies, ordering and review of radiographic studies, pulse oximetry and re-evaluation of patient's condition.   Labs Review Labs Reviewed  CBC WITH DIFFERENTIAL/PLATELET - Abnormal; Notable for the following:    WBC 23.6 (*)    RBC 3.83 (*)    Hemoglobin 10.0 (*)    HCT 32.1 (*)    RDW 16.0 (*)    Platelets 568 (*)    Neutro Abs 20.1 (*)    Monocytes Absolute 2.2 (*)    All other components within normal limits  I-STAT CG4 LACTIC ACID, ED - Abnormal; Notable for the following:    Lactic Acid, Venous 3.92 (*)    All other components within normal limits  CULTURE, BLOOD (ROUTINE X 2)  CULTURE, BLOOD (ROUTINE X 2)  URINE CULTURE   PROTIME-INR  COMPREHENSIVE METABOLIC PANEL  URINALYSIS, ROUTINE W REFLEX MICROSCOPIC (NOT AT [facility])  TROPONIN I  OCCULT BLOOD GASTRIC / DUODENUM (SPECIMEN CUP)    Imaging Review Dg Chest Portable 1 View  09/26/2015  CLINICAL DATA:  [facility] transfer from [facility], trach dependent, hypotensive, unresponsive EXAM: PORTABLE CHEST - 1 VIEW COMPARISON:  None available FINDINGS: Tracheostomy device projects in expected location. Left arm PICC to distal SVC. Heart size normal. Coarse prominent interstitial markings throughout both lungs. Focal left retrocardiac airspace opacity. Mild blunting of lateral costophrenic angles suggesting small effusions versus pleural scarring. No pneumothorax. Visualized skeletal structures are unremarkable. IMPRESSION: 1. Mild diffuse interstitial prominence of uncertain chronicity. 2. Left retrocardiac consolidation/atelectasis/scarring. 3. Question small pleural effusions versus pleural scarring. 4. Tracheostomy and PICC line in expected location. Electronically Signed   By: Corlis Leak M.D.   On: 09/26/2015 10:50   I have personally reviewed and evaluated these images and lab results as part of my medical decision-making.   EKG Interpretation   Date/Time:  Friday September 26 2015 10:48:02 EST Ventricular Rate:  104 PR Interval:  135 QRS Duration: 77 QT Interval:  353 QTC Calculation: 464 R Axis:   75 Text Interpretation:  Sinus tachycardia Atrial premature complexes  Biatrial enlargement Repol abnrm suggests ischemia, diffuse leads No old  tracing to compare Confirmed by Tessi Eustache  MD, Caryn Bee ([id number]) on 09/26/2015  10:55:57 AM      MDM   Final diagnoses:  Small bowel obstruction (HCC)  Sepsis, due to unspecified organism (HCC)  Hypotension, unspecified hypotension type  Anemia, unspecified anemia type  Chronic respiratory failure with hypoxia and hypercapnia (HCC)    Hypotension with elevated lactate.  Could represent sepsis versus hemorrhagic shock.   Patient does have coffee-ground emesis.  Hemoglobin is 10.  This lead to be rechecked.  Gastroscope pending at this time.  Vancomycin and Zosyn given.  30 cc/kg bolus.  Started on Protonix bolus and drip for possible upper GI bleed as she does have a history of erosive esophagitis.  She does have a history of DVT but best I can tell from the medication list she is not on anticoagulation for this.  All anticoagulation will need to be held.  Clinically it sounds like she may be developing aspiration pneumonia.  She does not have excessive vent settings  at this time.  She'll be admitted to the intensive care unit.  To have liters pulled from her stomach after decompression through her gastrostomy tube.  She will remain on intermittent suction.  She will undergo CT imaging of her abdomen pelvis to evaluate for possible small bowel obstruction although clinically this sounds consistent.  She could have a gastric outlet obstruction as well.  She'll need general surgery consultation likely after CT imaging of her abdomen and pelvis.  I'll speak with the intensivist about admission at this time.  If her hypotension does not resolve after 30 cc/kg bolus she will need to be initiated on IV pressors.  Per documentation she is a full code    Azalia Bilis, MD 09/26/15 1116  Azalia Bilis, MD 10/29/15 [phone]

## 2015-09-26 NOTE — Progress Notes (Signed)
General Surgery [facility]  Called by Dr. Kendrick Fries and case discussed.  I reviewed CT scan.  Recommend placing NG tube to low intermittent suction now.  Surgery will see in consultation and follow.  Velora Heckler, MD, [facility] Office: [phone]

## 2015-09-27 ENCOUNTER — Inpatient Hospital Stay (HOSPITAL_COMMUNITY): Payer: Medicare Other

## 2015-09-27 DIAGNOSIS — D649 Anemia, unspecified: Secondary | ICD-10-CM

## 2015-09-27 DIAGNOSIS — L89154 Pressure ulcer of sacral region, stage 4: Secondary | ICD-10-CM

## 2015-09-27 DIAGNOSIS — E43 Unspecified severe protein-calorie malnutrition: Secondary | ICD-10-CM

## 2015-09-27 DIAGNOSIS — R6520 Severe sepsis without septic shock: Secondary | ICD-10-CM

## 2015-09-27 DIAGNOSIS — A419 Sepsis, unspecified organism: Secondary | ICD-10-CM

## 2015-09-27 DIAGNOSIS — J9612 Chronic respiratory failure with hypercapnia: Secondary | ICD-10-CM

## 2015-09-27 DIAGNOSIS — Z930 Tracheostomy status: Secondary | ICD-10-CM

## 2015-09-27 DIAGNOSIS — N179 Acute kidney failure, unspecified: Secondary | ICD-10-CM

## 2015-09-27 DIAGNOSIS — R7881 Bacteremia: Secondary | ICD-10-CM

## 2015-09-27 DIAGNOSIS — J9620 Acute and chronic respiratory failure, unspecified whether with hypoxia or hypercapnia: Secondary | ICD-10-CM

## 2015-09-27 DIAGNOSIS — J9621 Acute and chronic respiratory failure with hypoxia: Secondary | ICD-10-CM

## 2015-09-27 DIAGNOSIS — J9611 Chronic respiratory failure with hypoxia: Secondary | ICD-10-CM | POA: Insufficient documentation

## 2015-09-27 DIAGNOSIS — K5669 Other intestinal obstruction: Secondary | ICD-10-CM

## 2015-09-27 LAB — BASIC METABOLIC PANEL
Anion gap: 13 (ref 5–15)
BUN: 64 mg/dL — AB (ref 6–20)
CALCIUM: 8.1 mg/dL — AB (ref 8.9–10.3)
CHLORIDE: 99 mmol/L — AB (ref 101–111)
CO2: 29 mmol/L (ref 22–32)
CREATININE: 1.34 mg/dL — AB (ref 0.44–1.00)
GFR, EST AFRICAN AMERICAN: 45 mL/min — AB (ref >60)
GFR, EST NON AFRICAN AMERICAN: 39 mL/min — AB (ref >60)
Glucose, Bld: 166 mg/dL — ABNORMAL HIGH (ref 65–99)
Potassium: 2.9 mmol/L — ABNORMAL LOW (ref 3.5–5.1)
SODIUM: 141 mmol/L (ref 135–145)

## 2015-09-27 LAB — CBC
HCT: 27.2 % — ABNORMAL LOW (ref 36.0–46.0)
HEMOGLOBIN: 8.5 g/dL — AB (ref 12.0–15.0)
MCH: 26.2 pg (ref 26.0–34.0)
MCHC: 31.3 g/dL (ref 30.0–36.0)
MCV: 83.7 fL (ref 78.0–100.0)
PLATELETS: 390 10*3/uL (ref 150–400)
RBC: 3.25 MIL/uL — ABNORMAL LOW (ref 3.87–5.11)
RDW: 16.4 % — AB (ref 11.5–15.5)
WBC: 20.1 10*3/uL — ABNORMAL HIGH (ref 4.0–10.5)

## 2015-09-27 MED ORDER — HYDRALAZINE HCL 20 MG/ML IJ SOLN
10.0000 mg | INTRAMUSCULAR | Status: DC | PRN
Start: 2015-09-27 — End: 2015-09-27
  Administered 2015-09-27: 10 mg via INTRAVENOUS
  Filled 2015-09-27: qty 1

## 2015-09-27 MED ORDER — METOPROLOL TARTRATE 1 MG/ML IV SOLN
2.5000 mg | INTRAVENOUS | Status: DC | PRN
  Administered 2015-09-27: 2.5 mg via INTRAVENOUS
  Filled 2015-09-27: qty 5

## 2015-09-27 MED ORDER — FENTANYL CITRATE (PF) 100 MCG/2ML IJ SOLN
25.0000 ug | INTRAMUSCULAR | Status: DC | PRN
Start: 2015-09-27 — End: 2015-10-01
  Administered 2015-09-27 – 2015-10-01 (×4): 25 ug via INTRAVENOUS
  Filled 2015-09-27 (×4): qty 2

## 2015-09-27 MED ORDER — HYDRALAZINE HCL 20 MG/ML IJ SOLN
20.0000 mg | INTRAMUSCULAR | Status: DC | PRN
  Administered 2015-09-28 – 2015-09-30 (×3): 20 mg via INTRAVENOUS
  Filled 2015-09-27 (×3): qty 1

## 2015-09-27 MED ORDER — POTASSIUM CHLORIDE 10 MEQ/50ML IV SOLN
10.0000 meq | INTRAVENOUS | Status: AC
Start: 2015-09-27 — End: 2015-09-27
  Administered 2015-09-27 (×6): 10 meq via INTRAVENOUS
  Filled 2015-09-27 (×3): qty 50

## 2015-09-27 MED ORDER — IOHEXOL 300 MG/ML  SOLN
50.0000 mL | Freq: Once | INTRAMUSCULAR | Status: AC | PRN
  Administered 2015-09-27: 50 mL via ORAL

## 2015-09-27 MED ORDER — PANTOPRAZOLE SODIUM 40 MG IV SOLR
40.0000 mg | INTRAVENOUS | Status: DC
  Administered 2015-09-28 – 2015-10-01 (×4): 40 mg via INTRAVENOUS
  Filled 2015-09-27 (×4): qty 40

## 2015-09-27 MED ORDER — DEXTROSE-NACL 5-0.45 % IV SOLN
INTRAVENOUS | Status: DC
  Administered 2015-09-27 – 2015-09-28 (×3): via INTRAVENOUS
  Administered 2015-09-29: 75 mL via INTRAVENOUS
  Administered 2015-09-30: 04:00:00 via INTRAVENOUS

## 2015-09-27 MED ORDER — METOPROLOL TARTRATE 1 MG/ML IV SOLN
5.0000 mg | INTRAVENOUS | Status: DC | PRN
  Administered 2015-09-27 – 2015-09-28 (×2): 5 mg via INTRAVENOUS
  Filled 2015-09-27 (×2): qty 5

## 2015-09-27 NOTE — Consult Note (Signed)
General Surgery [facility]  Reason for Consult: small bowel obstruction, colonic ileus  Referring Physician: Dr. Lake Bells, CCM  Kristen Arnold is an 70 y.o. female.  HPI: Patient is a 71 year old white female in the intensive care unit, sedated, on the ventilator. General surgery is consulted by critical care medicine for evaluation of possible small bowel obstruction versus colonic ileus. Patient's past medical and surgical history is limited due to poor records. Apparently the patient underwent some type of colonic resection due to obstructive symptoms and has had a prior ventral hernia repair with mesh. Patient had complications and was ultimately transferred to a long-term acute care facility at [facility] on 09/22/2015. Patient developed aspiration pneumonia and was transferred to the critical care service here at [facility] for management. CT scan of abdomen and pelvis shows a distended stomach, dilated loops of small bowel, possible transition point in the distal small bowel, and possible colonic ileus.  Past Medical History  Diagnosis Date  . Bowel obstruction ([place])   . Anemia   . COPD (chronic obstructive pulmonary disease) ([place])   . Dementia   . Acute on chronic respiratory failure ([place])   . Anxiety   . Depression   . Diabetes mellitus without complication ([place])   . DVT (deep venous thrombosis) ([place])   . Recurrent Clostridium difficile diarrhea   . History of Pseudomonas pneumonia   . Severe protein-calorie malnutrition [facility])     Past Surgical History  Procedure Laterality Date  . Tracheostomy    . Peg tube placement    . Bowel resection      right hemicolectomy  . Colon surgery    . Bronchoscopies      History reviewed. No pertinent family history.  Social History:  reports that she has quit smoking. Her smoking use included Cigarettes. She has never used smokeless tobacco. She reports that she does not drink alcohol or use illicit  drugs.  Allergies:  Allergies  Allergen Reactions  . Codeine     Unknown reaction, listed on MAR    Medications: I have reviewed the patient's current medications.  Results for orders placed or performed during the hospital encounter of 09/26/15 (from the past 48 hour(s))  Comprehensive metabolic panel     Status: Abnormal   Collection Time: 09/26/15 10:30 AM  Result Value Ref Range   Sodium 140 135 - 145 mmol/L   Potassium 3.6 3.5 - 5.1 mmol/L   Chloride 93 (L) 101 - 111 mmol/L   CO2 30 22 - 32 mmol/L   Glucose, Bld 198 (H) 65 - 99 mg/dL   BUN 65 (H) 6 - 20 mg/dL   Creatinine, Ser 1.43 (H) 0.44 - 1.00 mg/dL   Calcium 8.8 (L) 8.9 - 10.3 mg/dL   Total Protein 6.8 6.5 - 8.1 g/dL   Albumin 3.2 (L) 3.5 - 5.0 g/dL   AST 30 15 - 41 U/L   ALT 17 14 - 54 U/L   Alkaline Phosphatase 128 (H) 38 - 126 U/L   Total Bilirubin 0.5 0.3 - 1.2 mg/dL   GFR calc non Af Amer 36 (L) >60 mL/min   GFR calc Af Amer 42 (L) >60 mL/min    Comment: (NOTE) The eGFR has been calculated using the CKD EPI equation. This calculation has not been validated in all clinical situations. eGFR's persistently <60 mL/min signify possible Chronic Kidney Disease.    Anion gap 17 (H) 5 - 15  CBC WITH DIFFERENTIAL  Status: Abnormal   Collection Time: 09/26/15 10:30 AM  Result Value Ref Range   WBC 23.6 (H) 4.0 - 10.5 K/uL   RBC 3.83 (L) 3.87 - 5.11 MIL/uL   Hemoglobin 10.0 (L) 12.0 - 15.0 g/dL   HCT 32.1 (L) 36.0 - 46.0 %   MCV 83.8 78.0 - 100.0 fL   MCH 26.1 26.0 - 34.0 pg   MCHC 31.2 30.0 - 36.0 g/dL   RDW 16.0 (H) 11.5 - 15.5 %   Platelets 568 (H) 150 - 400 K/uL   Neutrophils Relative % 85 %   Neutro Abs 20.1 (H) 1.7 - 7.7 K/uL   Lymphocytes Relative 5 %   Lymphs Abs 1.3 0.7 - 4.0 K/uL   Monocytes Relative 10 %   Monocytes Absolute 2.2 (H) 0.1 - 1.0 K/uL   Eosinophils Relative 0 %   Eosinophils Absolute 0.0 0.0 - 0.7 K/uL   Basophils Relative 0 %   Basophils Absolute 0.0 0.0 - 0.1 K/uL  Blood  Culture (routine x 2)     Status: None (Preliminary result)   Collection Time: 09/26/15 10:30 AM  Result Value Ref Range   Specimen Description BLOOD LEFT ANTECUBITAL    Special Requests BOTTLES DRAWN AEROBIC AND ANAEROBIC 6CC    Culture  Setup Time      GRAM NEGATIVE RODS AEROBIC BOTTLE ONLY CRITICAL RESULT CALLED TO, READ BACK BY AND VERIFIED WITH: A [facility] RN [id number] 5/00/37 A BROWNING Performed at [facility]    Culture PENDING    Report Status PENDING   Troponin I     Status: Abnormal   Collection Time: 09/26/15 10:30 AM  Result Value Ref Range   Troponin I 0.06 (H) <0.031 ng/mL    Comment:        PERSISTENTLY INCREASED TROPONIN VALUES IN THE RANGE OF 0.04-0.49 ng/mL CAN BE SEEN IN:       -UNSTABLE ANGINA       -CONGESTIVE HEART FAILURE       -MYOCARDITIS       -CHEST TRAUMA       -ARRYHTHMIAS       -LATE PRESENTING MYOCARDIAL INFARCTION       -COPD   CLINICAL FOLLOW-UP RECOMMENDED.   Protime-INR     Status: None   Collection Time: 09/26/15 10:30 AM  Result Value Ref Range   Prothrombin Time 14.9 11.6 - 15.2 seconds   INR 1.20 0.00 - 1.49  I-Stat CG4 Lactic Acid, ED  (not at  [facility])     Status: Abnormal   Collection Time: 09/26/15 10:49 AM  Result Value Ref Range   Lactic Acid, Venous 3.92 (HH) 0.5 - 2.0 mmol/L   Comment NOTIFIED PHYSICIAN   Urinalysis, Routine w reflex microscopic (not at [facility])     Status: Abnormal   Collection Time: 09/26/15 10:54 AM  Result Value Ref Range   Color, Urine AMBER (A) YELLOW    Comment: BIOCHEMICALS MAY BE AFFECTED BY COLOR   APPearance CLOUDY (A) CLEAR   Specific Gravity, Urine 1.021 1.005 - 1.030   pH 5.0 5.0 - 8.0   Glucose, UA NEGATIVE NEGATIVE mg/dL   Hgb urine dipstick NEGATIVE NEGATIVE   Bilirubin Urine NEGATIVE NEGATIVE   Ketones, ur NEGATIVE NEGATIVE mg/dL   Protein, ur 100 (A) NEGATIVE mg/dL   Nitrite NEGATIVE NEGATIVE   Leukocytes, UA MODERATE (A) NEGATIVE  Urine microscopic-add on     Status: Abnormal    Collection Time: 09/26/15 10:54 AM  Result Value Ref  Range   Squamous Epithelial / LPF NONE SEEN NONE SEEN   WBC, UA 0-5 0 - 5 WBC/hpf   RBC / HPF 0-5 0 - 5 RBC/hpf   Bacteria, UA FEW (A) NONE SEEN   Crystals CA OXALATE CRYSTALS (A) NEGATIVE   Urine-Other YEAST PRESENT   Blood Culture (routine x 2)     Status: None (Preliminary result)   Collection Time: 09/26/15 11:00 AM  Result Value Ref Range   Specimen Description BLOOD LEFT ARM    Special Requests BOTTLES DRAWN AEROBIC AND ANAEROBIC 6CC    Culture  Setup Time      GRAM NEGATIVE RODS AEROBIC BOTTLE ONLY CRITICAL RESULT CALLED TO, READ BACK BY AND VERIFIED WITH: A ARNOLD,RN AT 0759 09/27/15 BY L BENFIELD    Culture      GRAM NEGATIVE RODS Performed at [facility]    Report Status PENDING   Occult bld gastric/duodenum (cup to lab)     Status: None   Collection Time: 09/26/15 11:16 AM  Result Value Ref Range   pH, Gastric NOT DONE    Occult Blood, Gastric NEGATIVE NEGATIVE  MRSA PCR Screening     Status: Abnormal   Collection Time: 09/26/15  1:37 PM  Result Value Ref Range   MRSA by PCR POSITIVE (A) NEGATIVE    Comment:        The GeneXpert MRSA Assay (FDA approved for NASAL specimens only), is one component of a comprehensive MRSA colonization surveillance program. It is not intended to diagnose MRSA infection nor to guide or monitor treatment for MRSA infections. RESULT CALLED TO, READ BACK BY AND VERIFIED WITH: FREI,L RN AT 1529 ON 2.24.17 BY EPPERSON,S   Blood gas, arterial     Status: Abnormal   Collection Time: 09/26/15  1:57 PM  Result Value Ref Range   FIO2 0.50    Delivery systems VENTILATOR    Mode PRESSURE REGULATED VOLUME CONTROL    VT 450 mL   LHR 20 resp/min   Peep/cpap 8.0 cm H20   pH, Arterial 7.482 (H) 7.350 - 7.450   pCO2 arterial 40.4 35.0 - 45.0 mmHg   pO2, Arterial 64.4 (L) 80.0 - 100.0 mmHg   Bicarbonate 29.9 (H) 20.0 - 24.0 mEq/L   TCO2 27.5 0 - 100 mmol/L   Acid-Base Excess  6.2 (H) 0.0 - 2.0 mmol/L   O2 Saturation 90.2 %   Patient temperature 98.6    Collection site RIGHT RADIAL    Drawn by [id number]    Sample type ARTERIAL DRAW    Allens test (pass/fail) PASS PASS  Lactic acid, plasma     Status: Abnormal   Collection Time: 09/26/15  2:40 PM  Result Value Ref Range   Lactic Acid, Venous 2.1 (HH) 0.5 - 2.0 mmol/L    Comment: CRITICAL RESULT CALLED TO, READ BACK BY AND VERIFIED WITH: L.FREI RN AT 7616 ON 09/26/15 BY S.VANHOORNE   CBC     Status: Abnormal   Collection Time: 09/26/15  2:40 PM  Result Value Ref Range   WBC 29.3 (H) 4.0 - 10.5 K/uL   RBC 3.70 (L) 3.87 - 5.11 MIL/uL   Hemoglobin 9.7 (L) 12.0 - 15.0 g/dL   HCT 30.9 (L) 36.0 - 46.0 %   MCV 83.5 78.0 - 100.0 fL   MCH 26.2 26.0 - 34.0 pg   MCHC 31.4 30.0 - 36.0 g/dL   RDW 16.3 (H) 11.5 - 15.5 %   Platelets 474 (H) 150 - 400  K/uL  CBC     Status: Abnormal   Collection Time: 09/27/15  4:10 AM  Result Value Ref Range   WBC 20.1 (H) 4.0 - 10.5 K/uL   RBC 3.25 (L) 3.87 - 5.11 MIL/uL   Hemoglobin 8.5 (L) 12.0 - 15.0 g/dL   HCT 27.2 (L) 36.0 - 46.0 %   MCV 83.7 78.0 - 100.0 fL   MCH 26.2 26.0 - 34.0 pg   MCHC 31.3 30.0 - 36.0 g/dL   RDW 16.4 (H) 11.5 - 15.5 %   Platelets 390 150 - 400 K/uL  Basic metabolic panel     Status: Abnormal   Collection Time: 09/27/15  4:10 AM  Result Value Ref Range   Sodium 141 135 - 145 mmol/L   Potassium 2.9 (L) 3.5 - 5.1 mmol/L    Comment: DELTA CHECK NOTED REPEATED TO VERIFY    Chloride 99 (L) 101 - 111 mmol/L   CO2 29 22 - 32 mmol/L   Glucose, Bld 166 (H) 65 - 99 mg/dL   BUN 64 (H) 6 - 20 mg/dL   Creatinine, Ser 1.34 (H) 0.44 - 1.00 mg/dL   Calcium 8.1 (L) 8.9 - 10.3 mg/dL   GFR calc non Af Amer 39 (L) >60 mL/min   GFR calc Af Amer 45 (L) >60 mL/min    Comment: (NOTE) The eGFR has been calculated using the CKD EPI equation. This calculation has not been validated in all clinical situations. eGFR's persistently <60 mL/min signify possible Chronic  Kidney Disease.    Anion gap 13 5 - 15    Ct Abdomen Pelvis W Contrast  09/26/2015  CLINICAL DATA:  Vomiting, abdominal distension, history of small bowel obstruction EXAM: CT ABDOMEN AND PELVIS WITH CONTRAST TECHNIQUE: Multidetector CT imaging of the abdomen and pelvis was performed using the standard protocol following bolus administration of intravenous contrast. CONTRAST:  61m OMNIPAQUE IOHEXOL 300 MG/ML  SOLN COMPARISON:  None. FINDINGS: Sagittal images of the spine shows diffuse osteopenia. Disc space flattening with mild anterior spurring and mild posterior spurring at L5-S1 level. There is mild compression deformity upper endplate of L3 vertebral body of indeterminate age. Clinical correlation is necessary. Mild spinal canal stenosis due to posterior spurring at this level. Facet degenerative changes noted L4 and L5 level. Extensive mitral valve calcifications are noted. There is bilateral small pleural effusion. Bilateral lower lobe posterior atelectasis or infiltrate. Atherosclerotic calcifications of abdominal aorta and iliac arteries are noted. No aortic aneurysm. Postsurgical changes are noted right anterior abdominal wall. There is a percutaneous gastrostomy tube. Moderate fluid noted within stomach. No definite evidence of gastric outlet obstruction. Enhanced liver shows mild intrahepatic and extrahepatic biliary ductal dilatation. The patient is status postcholecystectomy. The pancreas, spleen and adrenal glands are unremarkable. There is moderate left hydronephrosis and significant dilatation of left extrarenal pelvis. There is significant cortical thinning left kidney probable due to chronic obstructive uropathy. There is no left hydroureter. 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 Delayed renal images shows bilateral renal delay excretion left greater than right. No any contrast material noted within kidneys or collecting system. There are multiple dilated small  bowel loops throughout the abdomen and pelvis with with fluid and air-fluid levels. Examination is markedly limited within pelvis due to beam hardening artifact from right hip prosthesis. There is probable partial right hemicolectomy. In axial image 47 in right lower quadrant there is abrupt transition of the caliber of small bowel. This is highly suspicious for small bowel obstruction  probable due to adhesion. In axial image 39 there is some gaseous mild distended colon in right lower quadrant and postsurgical changes are noted in right colonic wall in axial image 40. This may represent a anastomotic site. The remaining of the distal colon is small caliber decompressed. Findings highly suspicious for associated significant colonic ileus. There is no ascites or free air. No adenopathy. There is a Foley catheter in decompressed urinary bladder. IMPRESSION: 1. Bilateral small pleural effusion with bilateral lower lobe posterior atelectasis. 2. There are multiple dilated small bowel loops throughout the abdomen and pelvis with with fluid and air-fluid levels. Examination is markedly limited within pelvis due to beam hardening artifact from right hip prosthesis. There is probable partial right hemicolectomy. In axial image 47 in right lower quadrant there is abrupt transition of the caliber of small bowel. This is highly suspicious for small bowel obstruction probable due to adhesion. In axial image 39 there is some gaseous mild distended colon in right lower quadrant and postsurgical changes are noted in right colonic wall in axial image 40. This may represent a anastomotic site. The remaining of the distal colon is small caliber decompressed. Findings highly suspicious for associated significant colonic ileus. Less likely associated colonic obstruction 3. Status postcholecystectomy. Mild intrahepatic and extrahepatic biliary ductal dilatation. 4. Percutaneous gastrostomy tube in place. Moderate distension of proximal  stomach with fluid without there is no gastric outlet obstruction. 5. There is moderate compression deformity upper endplate of L3 vertebral body of indeterminate age. 6. There is moderate left hydronephrosis and significant dilatation of left extrarenal pelvis. Significant thinning of left renal cortex. 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 Bilateral prior renal delayed excretion left greater than right. 7. Extensive atherosclerotic calcifications of abdominal aorta and iliac arteries. 8. Foley catheter in decompressed urinary bladder. Electronically Signed   By: Lahoma Crocker M.D.   On: 09/26/2015 13:46   Dg Chest Portable 1 View  09/26/2015  CLINICAL DATA:  [facility] transfer from [facility], trach dependent, hypotensive, unresponsive EXAM: PORTABLE CHEST - 1 VIEW COMPARISON:  None available FINDINGS: Tracheostomy device projects in expected location. Left arm PICC to distal SVC. Heart size normal. Coarse prominent interstitial markings throughout both lungs. Focal left retrocardiac airspace opacity. Mild blunting of lateral costophrenic angles suggesting small effusions versus pleural scarring. No pneumothorax. Visualized skeletal structures are unremarkable. IMPRESSION: 1. Mild diffuse interstitial prominence of uncertain chronicity. 2. Left retrocardiac consolidation/atelectasis/scarring. 3. Question small pleural effusions versus pleural scarring. 4. Tracheostomy and PICC line in expected location. Electronically Signed   By: Lucrezia Europe M.D.   On: 09/26/2015 10:50    Review of Systems  Unable to perform ROS  Blood pressure 144/71, pulse 109, temperature 98.1 F (36.7 C), temperature source Axillary, resp. rate 18, height '5\' 1"'$  (1.549 m), weight 45.4 kg (100 lb 1.4 oz), SpO2 96 %. Physical Exam  Constitutional:  Thin, cachectic appearing female  HENT:  Head: Normocephalic and atraumatic.  Right Ear: External ear normal.  Left Ear: External ear  normal.  Eyes: Conjunctivae are normal. Pupils are equal, round, and reactive to light. No scleral icterus.  Neck:  Tracheostomy in place, dressing dry  Cardiovascular: Normal rate, regular rhythm and normal heart sounds.   Respiratory: She has no wheezes.  On vent; coarse BS bilat  GI: Soft. She exhibits distension (mild). She exhibits no mass. There is no tenderness. There is no rebound and no guarding.  Musculoskeletal: She exhibits no edema.  Neurological:  Opens eyes to voice  Skin: Skin is warm and dry.    Assessment/Plan: Small bowel obstruction  Patient is evaluated in the intensive care unit. She is sedated on the ventilator but does arouse to voice and stimulation. Abdominal exam is relatively benign without obvious tenderness and no palpable mass. There is palpable mesh in the upper midline of the abdomen. There are well-healed surgical incisions.  CT scan shows distention of the stomach and multiple dilated loops of small bowel. I think she would benefit from nasogastric tube with low intermittent suction for decompression.  At this time, the patient does not require urgent surgical intervention. Certainly she requires support and treatment for suspected aspiration pneumonia. Hopefully the small bowel obstruction will improve as her overall medical condition stabilizes. General surgery will follow closely. Hopefully we can avoid operative intervention.  Earnstine Regal, MD, [facility] Office: [place] 09/27/2015, 8:11 AM

## 2015-09-27 NOTE — Progress Notes (Signed)
eLink Physician-Brief Progress Note Patient Name: Kristen Arnold DOB: 04/25/1946 MRN: [id number]   Date of Service  09/27/2015  HPI/Events of Note  Hypokalemia  eICU Interventions  Potassium replaced     Intervention Category Intermediate Interventions: Electrolyte abnormality - evaluation and management  [name] 09/27/2015, 5:26 AM

## 2015-09-27 NOTE — Progress Notes (Signed)
eLink Physician-Brief Progress Note Patient Name: Kristen Arnold DOB: [birth year] MRN: [id number]   Date of Service  09/27/2015  HPI/Events of Note  Hypertension  Intake/Output Summary (Last 24 hours) at 09/27/15 2214 Last data filed at 09/27/15 2100  Gross per 24 hour  Intake 2681.67 ml  Output   2650 ml  Net  31.67 ml     eICU Interventions  Increase IV lopressor and hydralazine IV boluses prn      Intervention Category Major Interventions: Hypertension - evaluation and management  Sandrea Hughs 09/27/2015, 10:14 PM

## 2015-09-27 NOTE — Progress Notes (Signed)
CRITICAL VALUE ALERT  Critical value received:  Gram negative rods in aerobic blood culture bottle.  Date of notification: 09/27/2015  Time of notification: 0800  Critical value read back: yes  Nurse who received alert:  Lorrin Jackson RN  MD notified (1st page):  Dr. Bonney Aid  Time of first page:  0800, doctor on unit and verbalized results  MD notified (2nd page):n/a  Time of second page:n/a  Responding MD:  Dr. Bonney Aid  Time MD responded:  [phone]

## 2015-09-27 NOTE — H&P (Signed)
PULMONARY / CRITICAL CARE MEDICINE   Name: Kristen Arnold MRN: [id number] DOB: Nov 09, 1945    ADMISSION DATE:  09/26/2015 CONSULTATION DATE:  09/26/2015  REFERRING MD:  Patria Mane, EDP  CHIEF COMPLAINT:  Sent from facility for aspiration and concern for bowel obstruction  BRIEF 70 year old female with COPD and narcotic dependence. In December 2016 she was admitted from an emergency department with findings of a bowel obstruction, renal failure, and lactic acidosis. She underwent a right hemicolectomy in the setting of bowel obstruction which was felt to be related to adhesions from a prior hernia repair. Postoperatively she was unable to be weaned from a ventilator so ultimately a tracheostomy was performed. She's had several infectious complications including an enterococcus UTI, MRSA pneumonia, Pseudomonas pneumonia, and Pseudomonas related wound infection, and C. difficile. She was transferred to [facility] this week in the setting of prolonged mechanical ventilation. [facility]2015 after above prolonged hospitalization.   She was then brought to the [facility] emergency department on 09/26/2015 asked her she was found to have presumed aspiration pneumonia in the setting of a possible small bowel obstruction. On the morning of 09/26/2015 she was found to have significant brown secretions from her mouth and it was felt that she had aspiration pneumonia. A chest x-ray showed findings worrisome for pneumonia. A KUB apparently showed findings concerning for a small bowel obstruction. In the [facility] emergency department she was noted to have brown, coffee ground bili is secretions from her G-tube. Of note, she was found to have erosive esophagitis in one of the health care facilities that cared for her prior to [facility].She was initially hypotensive in the [facility] emergency department but she has responded to IV fluids. Pulmonary and  critical care medicine was consulted for admission.  LINES/TUBES: PICC left arm (date?) Trach (date?) PEG (date?)    CULTURES: 2/24 Blood cultures>  February 24 urine cultures: February 24 respiratory culture:  Results for orders placed or performed during the hospital encounter of 09/26/15  Blood Culture (routine x 2)     Status: None (Preliminary result)   Collection Time: 09/26/15 10:30 AM  Result Value Ref Range Status   Specimen Description BLOOD LEFT ANTECUBITAL  Final   Special Requests BOTTLES DRAWN AEROBIC AND ANAEROBIC 6CC  Final   Culture  Setup Time   Final    GRAM NEGATIVE RODS AEROBIC BOTTLE ONLY CRITICAL RESULT CALLED TO, READ BACK BY AND VERIFIED WITH: A [facility] RN 2108 09/26/15 A BROWNING Performed at [facility]    Culture PENDING  Incomplete   Report Status PENDING  Incomplete  MRSA PCR Screening     Status: Abnormal   Collection Time: 09/26/15  1:37 PM  Result Value Ref Range Status   MRSA by PCR POSITIVE (A) NEGATIVE Final    Comment:        The GeneXpert MRSA Assay (FDA approved for NASAL specimens only), is one component of a comprehensive MRSA colonization surveillance program. It is not intended to diagnose MRSA infection nor to guide or monitor treatment for MRSA infections. RESULT CALLED TO, READ BACK BY AND VERIFIED WITH: FREI,L RN AT 1529 ON 2.24.17 BY EPPERSON,S      ANTIBIOTICS: 2/24 Oral vanc >  2/24 Vanc IV >  2/24 Zosyn IV >   SIGNIFICANT EVENTS & STUDIES 2/24 admission 09/26/2015 CT abdomen and pelvis    SUBJECTIVE/OVERNIGHT/INTERVAL HX 09/27/15 - stage 4 5cm sacral decub noticed at admit.  Nutrition dx severe malnutrtion at admit.  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 RN could not get NG. CCS recommending IR guided NG.  GNR in aerobic bottles. RASS -4 at admit and now -3 (this is off sedation)./ Not on pressors.   VITAL SIGNS: BP 144/71 mmHg  Pulse 109  Temp(Src) 99.5 F (37.5 C) (Oral)  Resp  18  Ht 5\' 1"  (1.549 m)  Wt 45.4 kg (100 lb 1.4 oz)  BMI 18.92 kg/m2  SpO2 96%  HEMODYNAMICS:    VENTILATOR SETTINGS: Vent Mode:  [-] PRVC FiO2 (%):  [40 %-100 %] 50 % Set Rate:  [20 bmp] 20 bmp Vt Set:  [450 mL] 450 mL PEEP:  [8 cmH20] 8 cmH20 Plateau Pressure:  [19 cmH20-23 cmH20] 23 cmH20  INTAKE / OUTPUT: I/O last 3 completed shifts: In: 3027.9 [I.V.:2667.9; Other:210; IV Piggyback:150] Out: 1050 [Urine:625; Drains:250; Stool:175]  PHYSICAL EXAMINATION: General:  Chronically ill appearing, on vent Neuro:  Withdraws to pain, opens eyes to pain, otherwise somnolent. RASS -3 equivalent HEENT:  NCAT, Trach site clean, dry Cardiovascular:  RRR, no mgr Lungs:  Vent supported breaths, normal air entry Abdomen:  Tender to palpation (some guarding), BS+, PEG in place, R surgical scar axillary line well healed Musculoskeletal:  Diminished bulk, tone Skin:  Stage IV sacral decub  LABS: PULMONARY  Recent Labs Lab 09/26/15 1357  PHART 7.482*  PCO2ART 40.4  PO2ART 64.4*  HCO3 29.9*  TCO2 27.5  O2SAT 90.2    CBC  Recent Labs Lab 09/26/15 1030 09/26/15 1440 09/27/15 0410  HGB 10.0* 9.7* 8.5*  HCT 32.1* 30.9* 27.2*  WBC 23.6* 29.3* 20.1*  PLT 568* 474* 390    COAGULATION  Recent Labs Lab 09/26/15 1030  INR 1.20    CARDIAC   Recent Labs Lab 09/26/15 1030  TROPONINI 0.06*   No results for input(s): PROBNP in the last 168 hours.   CHEMISTRY  Recent Labs Lab 09/26/15 1030 09/27/15 0410  NA 140 141  K 3.6 2.9*  CL 93* 99*  CO2 30 29  GLUCOSE 198* 166*  BUN 65* 64*  CREATININE 1.43* 1.34*  CALCIUM 8.8* 8.1*   Estimated Creatinine Clearance: 28 mL/min (by C-G formula based on Cr of 1.34).   LIVER  Recent Labs Lab 09/26/15 1030  AST 30  ALT 17  ALKPHOS 128*  BILITOT 0.5  PROT 6.8  ALBUMIN 3.2*  INR 1.20     INFECTIOUS  Recent Labs Lab 09/26/15 1049 09/26/15 1440  LATICACIDVEN 3.92* 2.1*     ENDOCRINE CBG (last 3)  No  results for input(s): GLUCAP in the last 72 hours.       IMAGING x48h  - image(s) personally visualized  -   highlighted in bold Ct Abdomen Pelvis W Contrast  09/26/2015  CLINICAL DATA:  Vomiting, abdominal distension, history of small bowel obstruction EXAM: CT ABDOMEN AND PELVIS WITH CONTRAST TECHNIQUE: Multidetector CT imaging of the abdomen and pelvis was performed using the standard protocol following bolus administration of intravenous contrast. CONTRAST:  50mL OMNIPAQUE IOHEXOL 300 MG/ML  SOLN COMPARISON:  None. FINDINGS: Sagittal images of the spine shows diffuse osteopenia. Disc space flattening with mild anterior spurring and mild posterior spurring at L5-S1 level. There is mild compression deformity upper endplate of L3 vertebral body of indeterminate age. Clinical correlation is necessary. Mild spinal canal stenosis due to posterior spurring at this level. Facet degenerative changes noted L4 and L5 level. Extensive mitral valve calcifications are noted. There  is bilateral small pleural effusion. Bilateral lower lobe posterior atelectasis or infiltrate. Atherosclerotic calcifications of abdominal aorta and iliac arteries are noted. No aortic aneurysm. Postsurgical changes are noted right anterior abdominal wall. There is a percutaneous gastrostomy tube. Moderate fluid noted within stomach. No definite evidence of gastric outlet obstruction. Enhanced liver shows mild intrahepatic and extrahepatic biliary ductal dilatation. The patient is status postcholecystectomy. The pancreas, spleen and adrenal glands are unremarkable. There is moderate left hydronephrosis and significant dilatation of left extrarenal pelvis. There is significant cortical thinning left kidney probable due to chronic obstructive uropathy. There is no left hydroureter. 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 Delayed renal images shows bilateral renal delay excretion left greater than right. No any  contrast material noted within kidneys or collecting system. There are multiple dilated small bowel loops throughout the abdomen and pelvis with with fluid and air-fluid levels. Examination is markedly limited within pelvis due to beam hardening artifact from right hip prosthesis. There is probable partial right hemicolectomy. In axial image 47 in right lower quadrant there is abrupt transition of the caliber of small bowel. This is highly suspicious for small bowel obstruction probable due to adhesion. In axial image 39 there is some gaseous mild distended colon in right lower quadrant and postsurgical changes are noted in right colonic wall in axial image 40. This may represent a anastomotic site. The remaining of the distal colon is small caliber decompressed. Findings highly suspicious for associated significant colonic ileus. There is no ascites or free air. No adenopathy. There is a Foley catheter in decompressed urinary bladder. IMPRESSION: 1. Bilateral small pleural effusion with bilateral lower lobe posterior atelectasis. 2. There are multiple dilated small bowel loops throughout the abdomen and pelvis with with fluid and air-fluid levels. Examination is markedly limited within pelvis due to beam hardening artifact from right hip prosthesis. There is probable partial right hemicolectomy. In axial image 47 in right lower quadrant there is abrupt transition of the caliber of small bowel. This is highly suspicious for small bowel obstruction probable due to adhesion. In axial image 39 there is some gaseous mild distended colon in right lower quadrant and postsurgical changes are noted in right colonic wall in axial image 40. This may represent a anastomotic site. The remaining of the distal colon is small caliber decompressed. Findings highly suspicious for associated significant colonic ileus. Less likely associated colonic obstruction 3. Status postcholecystectomy. Mild intrahepatic and extrahepatic biliary  ductal dilatation. 4. Percutaneous gastrostomy tube in place. Moderate distension of proximal stomach with fluid without there is no gastric outlet obstruction. 5. There is moderate compression deformity upper endplate of L3 vertebral body of indeterminate age. 6. There is moderate left hydronephrosis and significant dilatation of left extrarenal pelvis. Significant thinning of left renal cortex. 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 Bilateral prior renal delayed excretion left greater than right. 7. Extensive atherosclerotic calcifications of abdominal aorta and iliac arteries. 8. Foley catheter in decompressed urinary bladder. Electronically Signed   By: Natasha Mead M.D.   On: 09/26/2015 13:46   Dg Chest Portable 1 View  09/26/2015  CLINICAL DATA:  [facility]link transfer from [facility], trach dependent, hypotensive, unresponsive EXAM: PORTABLE CHEST - 1 VIEW COMPARISON:  None available FINDINGS: Tracheostomy device projects in expected location. Left arm PICC to distal SVC. Heart size normal. Coarse prominent interstitial markings throughout both lungs. Focal left retrocardiac airspace opacity. Mild blunting of lateral costophrenic angles suggesting small effusions  versus pleural scarring. No pneumothorax. Visualized skeletal structures are unremarkable. IMPRESSION: 1. Mild diffuse interstitial prominence of uncertain chronicity. 2. Left retrocardiac consolidation/atelectasis/scarring. 3. Question small pleural effusions versus pleural scarring. 4. Tracheostomy and PICC line in expected location. Electronically Signed   By: Corlis Leak M.D.   On: 09/26/2015 10:50        DISCUSSION: 70 y/o female with COPD, and a recent prolonged hospitalization from a large bowel obstruction leading to prolonged mechanical ventilator support and multiple infectious complications was admitted on 2/24 from [facility] with nausea/vomiting and likely aspiration.   ASSESSMENT /  PLAN:  PULMONARY A: #Baseline  - COPD NOS  #Current Acute on Chronic Resp Failure due to COPD and chronic critical illnes S/p tracheostomy - ?Dec 2016 in [place]   P:   Full vent support with setting from [facility] Trach care ABG now CXR PRN VAP prevention  CARDIOVASCULAR A:  Sinus tachycardia, no shock Lactic acidosis Sepsis   - improving lactate P:  Tele monitoring Continue IVF - change to D5 Half Monitor lactic acid  RENAL A:   AKI? No baseline labs available CT at admit with left hydro    - AKi improving. ?Cause of hydro is strictures  P:   Urology consult paged 09/27/15 Monitor BMET and UOP Replace electrolytes as needed   GASTROINTESTINAL A:   #baseline Recent bowel obstruction dec 2016  #current - concern for UGI bleed at admit - doubt based on clinical profile at followup 09/27/15. Known erosive esophagitis - confirmed SBO at admit 09/26/2015 . 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 C diff present on admission Recent MRSA pneumonia Recent Pseudomonas pneumonia Recent enterococcus UTI REcent pseudomonas wound infection (sacral) Recent C diff  #current - Aspiration pneumonia - GNR bacteremia  P:   Antibiotics as above WOC culture F/u culture  ENDOCRINE A:   No acute issues P:   Monitor glucose  NEUROLOGIC A:   Acute encephalopathy> presumably due to infection Chronic narcotic dependence  - RASS -3 equivalent 09/27/15 without meds and this is improved per RN  P:   DC all  Opioids and benzo including PRN - allow to wake up -> then if evidence of withdrawal - reintrdouce  opioids gently + try precededx RASS goal: 0 Remove fentanyl patch  DERM A Stage 4 decub at admit 09/26/2015 P Wound care consult   Case d/w DR Gerrit Friends at bedside   FAMILY  - Updates: none bedside 09/26/15 aand 09/27/15  - Inter-disciplinary family meet or Palliative Care meeting due by:  day 7 - 10/02/15. - Her prognosis is very poor. One year mortality for someone with copd in LTAC with multiple infections and now with SBO recurrent is nearly 100%. Will call palliative care      The patient is critically ill with multiple organ systems failure and requires high complexity decision making for assessment and support, frequent evaluation and titration of therapies, application of advanced monitoring technologies and extensive interpretation of multiple databases.   Critical Care Time devoted to patient care services described in this note  is  30  Minutes. This time reflects time of care of this signee Dr Kalman Shan. This critical care time does not reflect procedure time, or teaching time or supervisory time of PA/NP/Med student/Med Resident etc but could involve care discussion time    Dr. Kalman Shan, M.D., [facility].C.P Pulmonary and Critical Care Medicine Staff Physician [facility] [place] [facility] Pager: [phone], If no answer or between  15:00h - 7:00h: call [phone]  09/27/2015 8:24 AM

## 2015-09-27 NOTE — Progress Notes (Signed)
eLink Physician-Brief Progress Note Patient Name: Kristen Arnold DOB: September 16, 1945 MRN: [id number]   Date of Service  09/27/2015  HPI/Events of Note  Hbp/ pt on lopressor prior to admit  eICU Interventions  rx lopressor IV prn      Intervention Category Major Interventions: Hypertension - evaluation and management  Sandrea Hughs 09/27/2015, 7:23 PM

## 2015-09-27 NOTE — Progress Notes (Signed)
CRITICAL VALUE ALERT  Critical value received:  Aneorbic bottle for blood culture is positive for gram - rods, and gram + cocci  Date of notification:  09/27/2015  Time of notification:  1512  Critical value read back: yes  Nurse who received alert:  Lorrin Jackson RN  MD notified (1st page):  Dr. Sherene Sires  Time of first page:  1515  MD notified (2nd page): n/a  Time of second page: n/a  Responding MD: Dr. Sherene Sires  Time MD responded:  [phone]

## 2015-09-27 NOTE — Progress Notes (Signed)
Left hdyro dw./Dr Laverle Patter who reviewed CT   Given fact creat improving and making urine and cT suggests this is all chronic - he advised expectant Rx . I helld  Off formal consult   Recent Labs Lab 09/26/15 1030 09/27/15 0410  CREATININE 1.43* 1.34*      I/O last 3 completed shifts: In: 3027.9 [I.V.:2667.9; Other:210; IV Piggyback:150] Out: 1050 [Urine:625; Drains:250; Stool:175]   Dr. Kalman Shan, M.D., [facility].C.P Pulmonary and Critical Care Medicine Staff Physician [facility] [place] [facility] Critical [facility] Pager: [phone], If no answer or between  15:00h - 7:00h: call [phone]  09/27/2015 8:32 AM

## 2015-09-27 NOTE — Progress Notes (Signed)
eLink Physician-Brief Progress Note Patient Name: Kristen Arnold DOB: 10/28/1945 MRN: [id number]   Date of Service  09/27/2015  HPI/Events of Note  Pt requesting prn pain med  eICU Interventions  fentanly 25 mcg prn     Intervention Category Minor Interventions: Routine modifications to care plan (e.g. PRN medications for pain, fever)  Sandrea Hughs 09/27/2015, 8:59 PM

## 2015-09-27 NOTE — Progress Notes (Signed)
eLink Physician-Brief Progress Note Patient Name: Kristen Arnold DOB: January 18, 1946 MRN: [id number]   Date of Service  09/27/2015  HPI/Events of Note  bp not responding to IV lopressor  eICU Interventions  Add prn IV hydralazine     Intervention Category Major Interventions: Hypertension - evaluation and management  Sandrea Hughs 09/27/2015, 8:21 PM

## 2015-09-28 DIAGNOSIS — J9620 Acute and chronic respiratory failure, unspecified whether with hypoxia or hypercapnia: Secondary | ICD-10-CM

## 2015-09-28 LAB — URINE CULTURE

## 2015-09-28 LAB — MAGNESIUM: MAGNESIUM: 1.6 mg/dL — AB (ref 1.7–2.4)

## 2015-09-28 LAB — PHOSPHORUS: PHOSPHORUS: 2 mg/dL — AB (ref 2.5–4.6)

## 2015-09-28 LAB — GLUCOSE, CAPILLARY: Glucose-Capillary: 196 mg/dL — ABNORMAL HIGH (ref 65–99)

## 2015-09-28 LAB — DIGOXIN LEVEL: Digoxin Level: 0.5 ng/mL — ABNORMAL LOW (ref 0.8–2.0)

## 2015-09-28 LAB — TROPONIN I: TROPONIN I: 0.11 ng/mL — AB (ref <0.031)

## 2015-09-28 LAB — LACTIC ACID, PLASMA: Lactic Acid, Venous: 1 mmol/L (ref 0.5–2.0)

## 2015-09-28 MED ORDER — SODIUM CHLORIDE 0.9 % IV SOLN
250.0000 mg | Freq: Three times a day (TID) | INTRAVENOUS | Status: DC
  Administered 2015-09-28 – 2015-09-29 (×3): 250 mg via INTRAVENOUS
  Filled 2015-09-28 (×4): qty 250

## 2015-09-28 MED ORDER — MAGNESIUM SULFATE 4 GM/100ML IV SOLN
4.0000 g | Freq: Once | INTRAVENOUS | Status: AC
  Administered 2015-09-28: 4 g via INTRAVENOUS
  Filled 2015-09-28: qty 100

## 2015-09-28 MED ORDER — POTASSIUM PHOSPHATES 15 MMOLE/5ML IV SOLN
10.0000 mmol | Freq: Once | INTRAVENOUS | Status: AC
  Administered 2015-09-28: 10 mmol via INTRAVENOUS
  Filled 2015-09-28: qty 3.33

## 2015-09-28 NOTE — Progress Notes (Addendum)
CSW contacted [facility] to obtain additional contact info- they had not alternate numbers in their system for pt son Sadie Haber  CSW was able to get a hold of patient son, Sadie Haber, on his cell phone to discuss the patient- Sadie Haber was very polite during interview- Gerrs phone has poor service and did cut out but Gerr was able to call back afterwards.  Gerr confirms pt is from [facility] and that plan is for patient to return when medically stable- he expresses regret that he can not care for the pt with her high level of needs now.  Gerr reports that there are no other numbers for himself- he will soon be getting a new cellphone- CSW stressed importance of providing [place] with his new number if it changes.  CSW asked about other family member involvement- he states that pt has a brother in [place] (Mr Levada Schilling?) and another son (who is Gerrs half brother) who also lives in [place]- states they care for patient and would be good alternatives if we are unable to get a hold of Gerr.  Pt brother- Collins [place]- [phone] Gerr's half brother- Barbara Cower (lives with mr Levada Schilling and can be contacted at the same number)  CSW will continue to follow for eventual return to [facility].  Merlyn Lot, LCSWA Clinical Social Worker [phone]

## 2015-09-28 NOTE — Progress Notes (Addendum)
Pharmacy Antibiotic Note  Kristen Arnold is a 70 y.o. female admitted on 09/26/2015 with sepsis.  Pharmacy has been consulted for vancomycin/Zosyn dosing.  Blood cultures reveal P. Aeruginosa and CoNS 1/2 but 2nd BC with GPC.  Pseudomonas is resistant to pip/tazo (called microbiology).  She has history of pseudomonas infections per report from [facility].  She is on vanco enemas for CDAD.   Day #3 antibiotics  Patient presents to the emergency department today from [facility], LTAC, for possible bowel obstruction aspiration.The morning of presentation found to have brown vomit coming out of her tracheostomy site. She has been at [facility] since 09/22/2015. Review the chart demonstrates that she was hospitalized in December 2016 secondary to respiratory distress and was found to also have a small bowel obstruction at that time. During her hospitalizations at the prior LTAC she is noted to have C. difficile cA chest x-ray and a KUB obtained at the [facility] prior to transfer demonstrated aspiration pneumonia as small bowel obstruction. On arrival to the emergency department her gastrostomy tube was hooked up to suction and 2 and half liters of dark coffee-ground material was suctioned from her gastrostomy tube.   Plan:  Suggest change pip/tazo to either imipenem or ciprofloxacin.  Concern with cipro would be worsening CDAD  Addendum: CCM orders to change pip/tazo to imipenem, based on weight and CrCl, start imipenem  IV q8h  Continue vancomycin 500 mg IV q24h, consider trough tuesday  Height:  (154.9 cm) Weight: 103 lb 2.8 oz (46.8 kg) IBW/kg (Calculated) : 47.8  Temp (24hrs), Avg:98.7 F (37.1 C), Min:98.2 F (36.8 C), Max:99.8 F (37.7 C)   Recent Labs Lab 09/26/15 1030 09/26/15 1049 09/26/15 1440 09/27/15 0410 09/28/15 0432  WBC 23.6*  --  29.3* 20.1*  --   CREATININE 1.43*  --   --  1.34*  --   LATICACIDVEN  --  3.92* 2.1*  --  1.0    Estimated Creatinine  Clearance: 28.9 mL/min (by C-G formula based on Cr of 1.34).    Allergies  Allergen Reactions  . Codeine     Unknown reaction, listed on MAR    Antimicrobials this admission: 2/24 Zosyn >>  2/26 2/24 Vancomycin >>  2/21 (PTA) PO vanco >> 2/24 2/24 Rectal vanco >> 2/26 imipenem >>   Dose adjustments this admission:   Microbiology results: 2/24 BCx:  P.  Aeruginosa (S to gent, cipro, imi; R to pip/taz), CoNS 1/2 but 2nd cx with GPC (susc pend) 2/24 Urine: pending 2/24 MRSA PCR: Positive 2/24 trach aspirate: pending  Thank you for allowing pharmacy to be a part of this patient's care.  Juliette Alcide, PharmD, BCPS.   Pager: [phone] 09/28/2015 10:30 AM

## 2015-09-28 NOTE — Progress Notes (Signed)
Patient ID: Kristen Arnold, female   DOB: 17-Jul-1946, 70 y.o.   MRN: [id number]  General Surgery - [facility]  HD#: 3  Subjective: Patient in ICU, vent.  Awakens to voice.  NG tube in place with minimal output.  Having liquid stools.  Objective: Vital signs in last 24 hours: Temp:  [98.2 F (36.8 C)-99.8 F (37.7 C)] 98.2 F (36.8 C) (02/26 0500) Pulse Rate:  [97-126] 112 (02/26 0700) Resp:  [18-32] 21 (02/26 0700) BP: (146-187)/(61-111) 151/61 mmHg (02/26 0700) SpO2:  [90 %-100 %] 92 % (02/26 0700) FiO2 (%):  [40 %-70 %] 40 % (02/26 0500) Weight:  [46.8 kg (103 lb 2.8 oz)] 46.8 kg (103 lb 2.8 oz) (02/26 0403) Last BM Date: 09/27/15  Intake/Output from previous day: 02/25 0701 - 02/26 0700 In: 2725 [I.V.:1825; IV Piggyback:500] Out: 3050 [Urine:1925; Drains:500; Stool:625] Intake/Output this shift:    Physical Exam: HEENT - sclerae clear, mucous membranes moist Neck - trach site clear Chest - clear bilaterally Cor - RRR Abdomen - BS present, soft, minimal distension; surgical wounds right lateral abdominal wall dry and intact  Lab Results:   Recent Labs  09/26/15 1440 09/27/15 0410  WBC 29.3* 20.1*  HGB 9.7* 8.5*  HCT 30.9* 27.2*  PLT 474* 390   BMET  Recent Labs  09/26/15 1030 09/27/15 0410  NA 140 141  K 3.6 2.9*  CL 93* 99*  CO2 30 29  GLUCOSE 198* 166*  BUN 65* 64*  CREATININE 1.43* 1.34*  CALCIUM 8.8* 8.1*   PT/INR  Recent Labs  09/26/15 1030  LABPROT 14.9  INR 1.20   Comprehensive Metabolic Panel:    Component Value Date/Time   NA 141 09/27/2015 0410   NA 140 09/26/2015 1030   K 2.9* 09/27/2015 0410   K 3.6 09/26/2015 1030   CL 99* 09/27/2015 0410   CL 93* 09/26/2015 1030   CO2 29 09/27/2015 0410   CO2 30 09/26/2015 1030   BUN 64* 09/27/2015 0410   BUN 65* 09/26/2015 1030   CREATININE 1.34* 09/27/2015 0410   CREATININE 1.43* 09/26/2015 1030   GLUCOSE 166* 09/27/2015 0410   GLUCOSE 198* 09/26/2015 1030    CALCIUM 8.1* 09/27/2015 0410   CALCIUM 8.8* 09/26/2015 1030   AST 30 09/26/2015 1030   ALT 17 09/26/2015 1030   ALKPHOS 128* 09/26/2015 1030   BILITOT 0.5 09/26/2015 1030   PROT 6.8 09/26/2015 1030   ALBUMIN 3.2* 09/26/2015 1030    Studies/Results: Ct Abdomen Pelvis W Contrast  09/26/2015  CLINICAL DATA:  Vomiting, abdominal distension, history of small bowel obstruction EXAM: CT ABDOMEN AND PELVIS WITH CONTRAST TECHNIQUE: Multidetector CT imaging of the abdomen and pelvis was performed using the standard protocol following bolus administration of intravenous contrast. CONTRAST:  50mL OMNIPAQUE IOHEXOL 300 MG/ML  SOLN COMPARISON:  None. FINDINGS: Sagittal images of the spine shows diffuse osteopenia. Disc space flattening with mild anterior spurring and mild posterior spurring at L5-S1 level. There is mild compression deformity upper endplate of L3 vertebral body of indeterminate age. Clinical correlation is necessary. Mild spinal canal stenosis due to posterior spurring at this level. Facet degenerative changes noted L4 and L5 level. Extensive mitral valve calcifications are noted. There is bilateral small pleural effusion. Bilateral lower lobe posterior atelectasis or infiltrate. Atherosclerotic calcifications of abdominal aorta and iliac arteries are noted. No aortic aneurysm. Postsurgical changes are noted right anterior abdominal wall. There is a percutaneous gastrostomy tube. Moderate fluid noted within stomach. No definite evidence of  gastric outlet obstruction. Enhanced liver shows mild intrahepatic and extrahepatic biliary ductal dilatation. The patient is status postcholecystectomy. The pancreas, spleen and adrenal glands are unremarkable. There is moderate left hydronephrosis and significant dilatation of left extrarenal pelvis. There is significant cortical thinning left kidney probable due to chronic obstructive uropathy. There is no left hydroureter. 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 Delayed renal images shows bilateral renal delay excretion left greater than right. No any contrast material noted within kidneys or collecting system. There are multiple dilated small bowel loops throughout the abdomen and pelvis with with fluid and air-fluid levels. Examination is markedly limited within pelvis due to beam hardening artifact from right hip prosthesis. There is probable partial right hemicolectomy. In axial image 47 in right lower quadrant there is abrupt transition of the caliber of small bowel. This is highly suspicious for small bowel obstruction probable due to adhesion. In axial image 39 there is some gaseous mild distended colon in right lower quadrant and postsurgical changes are noted in right colonic wall in axial image 40. This may represent a anastomotic site. The remaining of the distal colon is small caliber decompressed. Findings highly suspicious for associated significant colonic ileus. There is no ascites or free air. No adenopathy. There is a Foley catheter in decompressed urinary bladder. IMPRESSION: 1. Bilateral small pleural effusion with bilateral lower lobe posterior atelectasis. 2. There are multiple dilated small bowel loops throughout the abdomen and pelvis with with fluid and air-fluid levels. Examination is markedly limited within pelvis due to beam hardening artifact from right hip prosthesis. There is probable partial right hemicolectomy. In axial image 47 in right lower quadrant there is abrupt transition of the caliber of small bowel. This is highly suspicious for small bowel obstruction probable due to adhesion. In axial image 39 there is some gaseous mild distended colon in right lower quadrant and postsurgical changes are noted in right colonic wall in axial image 40. This may represent a anastomotic site. The remaining of the distal colon is small caliber decompressed. Findings highly suspicious for associated significant colonic  ileus. Less likely associated colonic obstruction 3. Status postcholecystectomy. Mild intrahepatic and extrahepatic biliary ductal dilatation. 4. Percutaneous gastrostomy tube in place. Moderate distension of proximal stomach with fluid without there is no gastric outlet obstruction. 5. There is moderate compression deformity upper endplate of L3 vertebral body of indeterminate age. 6. There is moderate left hydronephrosis and significant dilatation of left extrarenal pelvis. Significant thinning of left renal cortex. 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 Bilateral prior renal delayed excretion left greater than right. 7. Extensive atherosclerotic calcifications of abdominal aorta and iliac arteries. 8. Foley catheter in decompressed urinary bladder. Electronically Signed   By: Natasha Mead M.D.   On: 09/26/2015 13:46   Dg Chest Portable 1 View  09/26/2015  CLINICAL DATA:  [facility] transfer from [facility], trach dependent, hypotensive, unresponsive EXAM: PORTABLE CHEST - 1 VIEW COMPARISON:  None available FINDINGS: Tracheostomy device projects in expected location. Left arm PICC to distal SVC. Heart size normal. Coarse prominent interstitial markings throughout both lungs. Focal left retrocardiac airspace opacity. Mild blunting of lateral costophrenic angles suggesting small effusions versus pleural scarring. No pneumothorax. Visualized skeletal structures are unremarkable. IMPRESSION: 1. Mild diffuse interstitial prominence of uncertain chronicity. 2. Left retrocardiac consolidation/atelectasis/scarring. 3. Question small pleural effusions versus pleural scarring. 4. Tracheostomy and PICC line in expected location. Electronically Signed   By: Ronald Pippins.D.  On: 09/26/2015 10:50   Dg Basil Dess Tube Plc W/fl W/rad  09/27/2015  CLINICAL DATA:  70 year old with small bowel obstruction. Patient has a gastrostomy tube and surgery has requested placement of  nasogastric tube for additional decompression. EXAM: NASO G TUBE PLACEMENT WITH FL AND WITH RAD CONTRAST:  50mL OMNIPAQUE IOHEXOL 300 MG/ML  SOLN FLUOROSCOPY TIME:  Radiation Exposure Index (as provided by the fluoroscopic device): 25.67 mGy If the device does not provide the exposure index: Fluoroscopy Time (in minutes and seconds):  4 minutes and 27 seconds COMPARISON:  Abdominal CT 09/26/2015 FINDINGS: A 14 French nasogastric tube was directed into the left nostril with difficulty. The tube would not advance beyond the proximal esophagus with fluoroscopy. As a result, a 5 [place] vascular catheter and Amplatz wire were used to get access beyond the proximal esophagus. Catheter and wire were advanced into the stomach. The 14 French nasogastric tube was advanced over the wire and placed in the stomach. Approximately 10 mL of Omnipaque 300 was injected to confirm placement in the stomach. Catheter was secured to the nose. Catheter was flushed with water at end of the procedure. IMPRESSION: Successful placement of a nasogastric tube using over-the-wire technique. Review of the recent abdominal CT suggests that the gastrostomy tube traverses the lateral margin of the left hepatic lobe. This was discussed with Dr. Johna Sheriff. Electronically Signed   By: Richarda Overlie M.D.   On: 09/27/2015 12:58    Assessment & Plans: Small bowel obstruction  NG decompression with suction, PEG to drainage  NPO, IVF  Will check AXR in AM 2/27 Aspiration pneumonia Sacral decubitus ulcer  Will follow.  Monitor outputs and check AXR in AM 2/27.  Would like to see some records from outside facility regarding recent surgical procedures.  Velora Heckler, MD, [facility] Office: [phone]   Shaina Gullatt Judie Petit 09/28/2015

## 2015-09-28 NOTE — H&P (Signed)
PULMONARY / CRITICAL CARE MEDICINE   Name: Kristen Arnold MRN: [id number] DOB: [birth year]    ADMISSION DATE:  09/26/2015 CONSULTATION DATE:  09/26/2015  REFERRING MD:  Patria Mane, EDP  CHIEF COMPLAINT:  Sent from facility for aspiration and concern for bowel obstruction  BRIEF 70 year old female with COPD and narcotic dependence. In December 2016 she was admitted from an emergency department with findings of a bowel obstruction, renal failure, and lactic acidosis. She underwent a right hemicolectomy in the setting of bowel obstruction which was felt to be related to adhesions from a prior hernia repair. Postoperatively she was unable to be weaned from a ventilator so ultimately a tracheostomy was performed. She's had several infectious complications including an enterococcus UTI, MRSA pneumonia, Pseudomonas pneumonia, and Pseudomonas related wound infection, and C. difficile. She was transferred to [facility] this week in the setting of prolonged mechanical ventilation. [facility]2015 after above prolonged hospitalization.   She was then brought to the [facility] emergency department on 09/26/2015 asked her she was found to have presumed aspiration pneumonia in the setting of a possible small bowel obstruction. On the morning of 09/26/2015 she was found to have significant brown secretions from her mouth and it was felt that she had aspiration pneumonia. A chest x-ray showed findings worrisome for pneumonia. A KUB apparently showed findings concerning for a small bowel obstruction. In the [facility] emergency department she was noted to have brown, coffee ground bili is secretions from her G-tube. Of note, she was found to have erosive esophagitis in one of the health care facilities that cared for her prior to [facility].She was initially hypotensive in the [facility] emergency department but she has responded to IV fluids. Pulmonary and  critical care medicine was consulted for admission.  LINES/TUBES: PICC left arm (date?) Trach (date?) PEG (date?)    CULTURES: 2/24 Blood cultures> pseudomona R -to cephalosporins. S - cipro and imipenem February 24 urine cultures: February 24 respiratory culture:Pseudomonas 2/24 - MRSA PCR - POSITIVE     ANTIBIOTICS: 2/24 Oral vanc >  2/24 Vanc IV >  2/24 Zosyn IV >   SIGNIFICANT EVENTS & STUDIES 2/24 admission 09/26/2015 CT abdomen and pelvis  09/27/15 - stage 4 5cm sacral decub noticed at admit. Nutrition dx severe malnutrtion at admit.  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 RN could not get NG. CCS recommending IR guided NG.  GNR in aerobic bottles. RASS -4 at admit and now -3 (this is off sedation)./ Not on pressors.     SUBJECTIVE/OVERNIGHT/INTERVAL HX 09/28/15 - Ongoing diarrhea. RN says positive C Diff < 1 week ago at [facility] but no evidence ion EMR.MRSA pcr positive. Blood and urine with pseudomonas. Not on pressors. No evidence of family around.More awake  VITAL SIGNS: BP 172/68 mmHg  Pulse 112  Temp(Src) 98.4 F (36.9 C) (Axillary)  Resp 26  Ht 5\' 1"  (1.549 m)  Wt 46.8 kg (103 lb 2.8 oz)  BMI 19.50 kg/m2  SpO2 98%  HEMODYNAMICS:    VENTILATOR SETTINGS: Vent Mode:  [-] PRVC FiO2 (%):  [40 %-70 %] 40 % Set Rate:  [20 bmp] 20 bmp Vt Set:  [450 mL] 450 mL PEEP:  [8 cmH20] 8 cmH20 Plateau Pressure:  [17 cmH20-23 cmH20] 20 cmH20  INTAKE / OUTPUT: I/O last 3 completed shifts: In: 5061.7 [I.V.:3861.7; Other:600; IV Piggyback:600] Out: 3900 [Urine:2450; Drains:650; Stool:800]  PHYSICAL EXAMINATION: General:  Chronically ill appearing,  on vent. Very frail and cachetic Neuro:  Awake. Nods. Seems to understand simple questions  HEENT:  NCAT, Trach site clean, dry Cardiovascular:  RRR, no mgr Lungs:  Vent supported breaths, normal air entry Abdomen:  Tender to palpation (some guarding), BS+, PEG in place, R surgical scar axillary line well  healed Musculoskeletal:  Diminished bulk, tone Skin:  Stage IV sacral decub  LABS: PULMONARY  Recent Labs Lab 09/26/15 1357  PHART 7.482*  PCO2ART 40.4  PO2ART 64.4*  HCO3 29.9*  TCO2 27.5  O2SAT 90.2    CBC  Recent Labs Lab 09/26/15 1030 09/26/15 1440 09/27/15 0410  HGB 10.0* 9.7* 8.5*  HCT 32.1* 30.9* 27.2*  WBC 23.6* 29.3* 20.1*  PLT 568* 474* 390    COAGULATION  Recent Labs Lab 09/26/15 1030  INR 1.20    CARDIAC    Recent Labs Lab 09/26/15 1030 09/28/15 0436  TROPONINI 0.06* 0.11*   No results for input(s): PROBNP in the last 168 hours.   CHEMISTRY  Recent Labs Lab 09/26/15 1030 09/27/15 0410 09/28/15 0436  NA 140 141  --   K 3.6 2.9*  --   CL 93* 99*  --   CO2 30 29  --   GLUCOSE 198* 166*  --   BUN 65* 64*  --   CREATININE 1.43* 1.34*  --   CALCIUM 8.8* 8.1*  --   MG  --   --  1.6*  PHOS  --   --  2.0*   Estimated Creatinine Clearance: 28.9 mL/min (by C-G formula based on Cr of 1.34).   LIVER  Recent Labs Lab 09/26/15 1030  AST 30  ALT 17  ALKPHOS 128*  BILITOT 0.5  PROT 6.8  ALBUMIN 3.2*  INR 1.20     INFECTIOUS  Recent Labs Lab 09/26/15 1049 09/26/15 1440 09/28/15 0432  LATICACIDVEN 3.92* 2.1* 1.0     ENDOCRINE CBG (last 3)  No results for input(s): GLUCAP in the last 72 hours.       IMAGING x48h  - image(s) personally visualized  -   highlighted in bold Ct Abdomen Pelvis W Contrast  09/26/2015  CLINICAL DATA:  Vomiting, abdominal distension, history of small bowel obstruction EXAM: CT ABDOMEN AND PELVIS WITH CONTRAST TECHNIQUE: Multidetector CT imaging of the abdomen and pelvis was performed using the standard protocol following bolus administration of intravenous contrast. CONTRAST:  50mL OMNIPAQUE IOHEXOL 300 MG/ML  SOLN COMPARISON:  None. FINDINGS: Sagittal images of the spine shows diffuse osteopenia. Disc space flattening with mild anterior spurring and mild posterior spurring at L5-S1 level.  There is mild compression deformity upper endplate of L3 vertebral body of indeterminate age. Clinical correlation is necessary. Mild spinal canal stenosis due to posterior spurring at this level. Facet degenerative changes noted L4 and L5 level. Extensive mitral valve calcifications are noted. There is bilateral small pleural effusion. Bilateral lower lobe posterior atelectasis or infiltrate. Atherosclerotic calcifications of abdominal aorta and iliac arteries are noted. No aortic aneurysm. Postsurgical changes are noted right anterior abdominal wall. There is a percutaneous gastrostomy tube. Moderate fluid noted within stomach. No definite evidence of gastric outlet obstruction. Enhanced liver shows mild intrahepatic and extrahepatic biliary ductal dilatation. The patient is status postcholecystectomy. The pancreas, spleen and adrenal glands are unremarkable. There is moderate left hydronephrosis and significant dilatation of left extrarenal pelvis. There is significant cortical thinning left kidney probable due to chronic obstructive uropathy. There is no left hydroureter. 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 Delayed renal images shows bilateral renal delay excretion left greater than right. No any contrast material noted within kidneys or collecting system. There are multiple dilated small bowel loops throughout the abdomen and pelvis with with fluid and air-fluid levels. Examination is markedly limited within pelvis due to beam hardening artifact from right hip prosthesis. There is probable partial right hemicolectomy. In axial image 47 in right lower quadrant there is abrupt transition of the caliber of small bowel. This is highly suspicious for small bowel obstruction probable due to adhesion. In axial image 39 there is some gaseous mild distended colon in right lower quadrant and postsurgical changes are noted in right colonic wall in axial image 40. This may represent a  anastomotic site. The remaining of the distal colon is small caliber decompressed. Findings highly suspicious for associated significant colonic ileus. There is no ascites or free air. No adenopathy. There is a Foley catheter in decompressed urinary bladder. IMPRESSION: 1. Bilateral small pleural effusion with bilateral lower lobe posterior atelectasis. 2. There are multiple dilated small bowel loops throughout the abdomen and pelvis with with fluid and air-fluid levels. Examination is markedly limited within pelvis due to beam hardening artifact from right hip prosthesis. There is probable partial right hemicolectomy. In axial image 47 in right lower quadrant there is abrupt transition of the caliber of small bowel. This is highly suspicious for small bowel obstruction probable due to adhesion. In axial image 39 there is some gaseous mild distended colon in right lower quadrant and postsurgical changes are noted in right colonic wall in axial image 40. This may represent a anastomotic site. The remaining of the distal colon is small caliber decompressed. Findings highly suspicious for associated significant colonic ileus. Less likely associated colonic obstruction 3. Status postcholecystectomy. Mild intrahepatic and extrahepatic biliary ductal dilatation. 4. Percutaneous gastrostomy tube in place. Moderate distension of proximal stomach with fluid without there is no gastric outlet obstruction. 5. There is moderate compression deformity upper endplate of L3 vertebral body of indeterminate age. 6. There is moderate left hydronephrosis and significant dilatation of left extrarenal pelvis. Significant thinning of left renal cortex. 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 Bilateral prior renal delayed excretion left greater than right. 7. Extensive atherosclerotic calcifications of abdominal aorta and iliac arteries. 8. Foley catheter in decompressed urinary  bladder. Electronically Signed   By: Natasha Mead M.D.   On: 09/26/2015 13:46   Dg Basil Dess Tube Plc W/fl W/rad  09/27/2015  CLINICAL DATA:  70 year old with small bowel obstruction. Patient has a gastrostomy tube and surgery has requested placement of nasogastric tube for additional decompression. EXAM: NASO G TUBE PLACEMENT WITH FL AND WITH RAD CONTRAST:  50mL OMNIPAQUE IOHEXOL 300 MG/ML  SOLN FLUOROSCOPY TIME:  Radiation Exposure Index (as provided by the fluoroscopic device): 25.67 mGy If the device does not provide the exposure index: Fluoroscopy Time (in minutes and seconds):  4 minutes and 27 seconds COMPARISON:  Abdominal CT 09/26/2015 FINDINGS: A 14 French nasogastric tube was directed into the left nostril with difficulty. The tube would not advance beyond the proximal esophagus with fluoroscopy. As a result, a 5 [place] vascular catheter and Amplatz wire were used to get access beyond the proximal esophagus. Catheter and wire were advanced into the stomach. The 14 French nasogastric tube was advanced over the wire and placed in the stomach. Approximately 10 mL of Omnipaque 300 was injected to confirm placement in the stomach. Catheter was secured  to the nose. Catheter was flushed with water at end of the procedure. IMPRESSION: Successful placement of a nasogastric tube using over-the-wire technique. Review of the recent abdominal CT suggests that the gastrostomy tube traverses the lateral margin of the left hepatic lobe. This was discussed with Dr. Johna Sheriff. Electronically Signed   By: Richarda Overlie M.D.   On: 09/27/2015 12:58        DISCUSSION: 70 y/o female with COPD, and a recent prolonged hospitalization from a large bowel obstruction leading to prolonged mechanical ventilator support and multiple infectious complications was admitted on 2/24 from [facility] with nausea/vomiting and likely aspiration.   ASSESSMENT / PLAN:  PULMONARY A: #Baseline  - COPD NOS  #Current Acute on Chronic Resp  Failure due to COPD and chronic critical illnes S/p tracheostomy - ?Dec 2016 [place] v [place]   - unable to wean - this is chronic   P:   Full vent support with setting from [facility] Trach care ABG now CXR PRN VAP prevention  CARDIOVASCULAR A:  Sinus tachycardia, no shock Lactic acidosis Sepsis   -  Lactate normalized. Not on pressors  P:  Tele monitoring Continue IVF -  D5 Half Monitor lactic acid  RENAL A:   AKI? No baseline labs available CT at admit with left hydro    - AKi improving. 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 Maknig urine   P:   Monitor BMET and UOP Replace electrolytes as needed   GASTROINTESTINAL A:   #baseline Recent bowel obstruction dec 2016  #current - concern for UGI bleed at admit - doubt based on clinical profile at followup 09/27/15. Known erosive esophagitis - confirmed SBO at admit 09/26/2015 . 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 C diff present on admission Recent MRSA pneumonia Recent Pseudomonas pneumonia Recent enterococcus UTI REcent pseudomonas wound infection (sacral) Recent C diff  #current  - MRSA PCR +  - Psedudonmonas bacteremia and pneumonia - C diff - per RN hx  P:   Antibiotics as above WOC culture F/u culture  ENDOCRINE A:   No acute issues P:   Monitor glucose  NEUROLOGIC A:   Acute encephalopathy> presumably due to infection Chronic narcotic dependence  - RASS +1 equivalent 09/28/15 without meds and this is improved per RN  P:   DC all  Opioids and benzo including PRN - allow to wake up -> then if evidence of withdrawal -  reintrdouce opioids gently + try precededx RASS goal: 0   DERM A Stage 4 decub at admit 09/26/2015 P Wound care consult   FAMILY  - Updates: none bedside 09/26/15 aand 09/27/15 and 09/28/15. RN Amy called son Harvie Heck in my presence . She informed him patient in hospital. Son hung up phone without even asking which hospital and any details  - Inter-disciplinary family meet or Palliative Care meeting due by:  day 7 - 10/02/15. - Her prognosis is very poor. One year mortality for someone with copd in LTAC with multiple infections and now with SBO recurrent is nearly 100%. Will call palliative  care      The patient is critically ill with multiple organ systems failure and requires high complexity decision making for assessment and support, frequent evaluation and titration of therapies, application of advanced monitoring technologies and extensive interpretation of multiple databases.   Critical Care Time devoted to patient care services described in this note is  30  Minutes. This time reflects time of care of this signee Dr Kalman Shan. This critical care time does not reflect procedure time, or teaching time or supervisory time of PA/NP/Med student/Med Resident etc but could involve care discussion time    Dr. Kalman Shan, M.D., [facility].C.P Pulmonary and Critical Care Medicine Staff Physician [facility] [place] [facility] and Critical Care Pager: [phone], If no answer or between  15:00h - 7:00h: call [phone]  09/28/2015 11:39 AM

## 2015-09-28 NOTE — Progress Notes (Signed)
Called son Eliott Nine about his mother.  Was questioning the son about his mother when the son hung up on the RN calling to provide information. Called again, and no answer.  Called Child psychotherapist and gave information to Child psychotherapist.  No call all weekend to inquire about the patient and how her care was progressing.  Continue to monitor patient closely.  Keyasia Jolliff Debroah Loop RN

## 2015-09-29 ENCOUNTER — Inpatient Hospital Stay (HOSPITAL_COMMUNITY): Payer: Medicare Other

## 2015-09-29 LAB — CULTURE, RESPIRATORY

## 2015-09-29 LAB — BASIC METABOLIC PANEL
ANION GAP: 8 (ref 5–15)
ANION GAP: 8 (ref 5–15)
BUN: 18 mg/dL (ref 6–20)
BUN: 14 mg/dL (ref 6–20)
CALCIUM: 8.1 mg/dL — AB (ref 8.9–10.3)
CO2: 28 mmol/L (ref 22–32)
CO2: 26 mmol/L (ref 22–32)
Calcium: 8.3 mg/dL — ABNORMAL LOW (ref 8.9–10.3)
Chloride: 104 mmol/L (ref 101–111)
Chloride: 105 mmol/L (ref 101–111)
Creatinine, Ser: 0.73 mg/dL (ref 0.44–1.00)
Creatinine, Ser: 0.69 mg/dL (ref 0.44–1.00)
GFR calc Af Amer: >60 mL/min (ref >60)
GLUCOSE: 187 mg/dL — AB (ref 65–99)
Glucose, Bld: 190 mg/dL — ABNORMAL HIGH (ref 65–99)
POTASSIUM: 2.1 mmol/L — AB (ref 3.5–5.1)
POTASSIUM: 3.2 mmol/L — AB (ref 3.5–5.1)
SODIUM: 140 mmol/L (ref 135–145)
SODIUM: 139 mmol/L (ref 135–145)

## 2015-09-29 LAB — CBC WITH DIFFERENTIAL/PLATELET
BASOS ABS: 0 10*3/uL (ref 0.0–0.1)
BASOS PCT: 0 %
EOS PCT: 2 %
Eosinophils Absolute: 0.3 10*3/uL (ref 0.0–0.7)
HCT: 26 % — ABNORMAL LOW (ref 36.0–46.0)
Hemoglobin: 7.9 g/dL — ABNORMAL LOW (ref 12.0–15.0)
LYMPHS PCT: 12 %
Lymphs Abs: 1.5 10*3/uL (ref 0.7–4.0)
MCH: 25.7 pg — ABNORMAL LOW (ref 26.0–34.0)
MCHC: 30.4 g/dL (ref 30.0–36.0)
MCV: 84.7 fL (ref 78.0–100.0)
Monocytes Absolute: 1.1 10*3/uL — ABNORMAL HIGH (ref 0.1–1.0)
Monocytes Relative: 9 %
NEUTROS ABS: 9.3 10*3/uL — AB (ref 1.7–7.7)
Neutrophils Relative %: 77 %
PLATELETS: 314 10*3/uL (ref 150–400)
RBC: 3.07 MIL/uL — AB (ref 3.87–5.11)
RDW: 16.2 % — ABNORMAL HIGH (ref 11.5–15.5)
WBC: 12.2 10*3/uL — AB (ref 4.0–10.5)

## 2015-09-29 LAB — C DIFFICILE QUICK SCREEN W PCR REFLEX
C DIFFICILE (CDIFF) INTERP: NEGATIVE
C DIFFICILE (CDIFF) TOXIN: NEGATIVE
C DIFFICLE (CDIFF) ANTIGEN: NEGATIVE

## 2015-09-29 LAB — CULTURE, RESPIRATORY W GRAM STAIN

## 2015-09-29 LAB — PHOSPHORUS: PHOSPHORUS: 2.2 mg/dL — AB (ref 2.5–4.6)

## 2015-09-29 LAB — MAGNESIUM: MAGNESIUM: 2 mg/dL (ref 1.7–2.4)

## 2015-09-29 MED ORDER — DIGOXIN 0.25 MG/ML IJ SOLN
0.7500 mg | Freq: Every day | INTRAMUSCULAR | Status: DC
  Filled 2015-09-29: qty 3

## 2015-09-29 MED ORDER — CIPROFLOXACIN IN D5W 400 MG/200ML IV SOLN
400.0000 mg | Freq: Three times a day (TID) | INTRAVENOUS | Status: DC
  Administered 2015-09-29 – 2015-10-01 (×6): 400 mg via INTRAVENOUS
  Filled 2015-09-29 (×6): qty 200

## 2015-09-29 MED ORDER — DIGOXIN 0.25 MG/ML IJ SOLN
0.0750 mg | Freq: Every day | INTRAMUSCULAR | Status: DC
  Administered 2015-09-29 – 2015-10-01 (×3): 0.075 mg via INTRAVENOUS
  Filled 2015-09-29 (×3): qty 0.5

## 2015-09-29 MED ORDER — POTASSIUM CHLORIDE 10 MEQ/50ML IV SOLN
10.0000 meq | INTRAVENOUS | Status: AC
  Administered 2015-09-29 (×10): 10 meq via INTRAVENOUS
  Filled 2015-09-29 (×10): qty 50

## 2015-09-29 MED ORDER — POTASSIUM PHOSPHATES 15 MMOLE/5ML IV SOLN
10.0000 mmol | Freq: Once | INTRAVENOUS | Status: AC
  Administered 2015-09-29: 10 mmol via INTRAVENOUS
  Filled 2015-09-29: qty 3.33

## 2015-09-29 MED ORDER — FLUCONAZOLE IN SODIUM CHLORIDE 100-0.9 MG/50ML-% IV SOLN
100.0000 mg | INTRAVENOUS | Status: DC
Start: 2015-09-29 — End: 2015-10-01
  Administered 2015-09-29 – 2015-10-01 (×3): 100 mg via INTRAVENOUS
  Filled 2015-09-29 (×4): qty 50

## 2015-09-29 MED ORDER — VANCOMYCIN HCL IN DEXTROSE 1-5 GM/200ML-% IV SOLN
1000.0000 mg | INTRAVENOUS | Status: DC
  Administered 2015-09-29 – 2015-09-30 (×2): 1000 mg via INTRAVENOUS
  Filled 2015-09-29 (×4): qty 200

## 2015-09-29 MED ORDER — CIPROFLOXACIN IN D5W 400 MG/200ML IV SOLN
400.0000 mg | INTRAVENOUS | Status: AC
  Administered 2015-09-29: 400 mg via INTRAVENOUS
  Filled 2015-09-29: qty 200

## 2015-09-29 NOTE — Progress Notes (Signed)
Inpatient Diabetes Program Recommendations  AACE/ADA: New Consensus Statement on Inpatient Glycemic Control (2015)  Target Ranges:  Prepandial:   less than 140 mg/dL      Peak postprandial:   less than 180 mg/dL (1-2 hours)      Critically ill patients:  140 - 180 mg/dL   Results for Kristen Arnold, Kristen Arnold (MRN [id number]) as of 09/29/2015 09:59  Ref. Range 09/26/2015 10:30 09/27/2015 04:10 09/29/2015 04:51  Glucose Latest Ref Range: 65-99 mg/dL 409 (H) 811 (H) 914 (H)    History: DM  Home DM Meds: Novolog 2-10 units tidac per SSI  Current Insulin Orders: None     MD- Please consider placing order for Novolog Sensitive Correction Scale/ SSI (0-9 units) Q4 hours     --Will follow patient during hospitalization--  Ambrose Finland RN, MSN, CDE Diabetes Coordinator Inpatient Glycemic Control Team Team Pager: [phone] (8a-5p)

## 2015-09-29 NOTE — Progress Notes (Signed)
   09/29/15 1600  Clinical Encounter Type  Visited With Patient  Visit Type Initial;Psychological support;Spiritual support;Social support  Referral From Chaplain;Palliative care team  Spiritual Encounters  Spiritual Needs Prayer;Emotional  Stress Factors  Patient Stress Factors Family relationships  Family Stress Factors (No interest to support)  [facility] visited with pt and provided ministry of presence; per MD notes, family not supportive and pt benefits from visitors; [facility] will follow-up; note that pt mouths words and difficult to comprehend.  Erline Levine 4:49 PM

## 2015-09-29 NOTE — Progress Notes (Signed)
Pharmacy Antibiotic Note  Kristen Arnold is a 70 y.o. female admitted on 09/26/2015 with sepsis.  Pharmacy has been consulted for vancomycin/Zosyn dosing.  Blood cultures reveal P. Aeruginosa and CoNS 2/2.  Pseudomonas is resistant to pip/tazo (called microbiology).  She has history of pseudomonas infections per report from [facility].  She is on vanc enemas for CDAD.   Day #4 antibiotics  Patient presents to the emergency department today from [facility], LTAC, for possible bowel obstruction aspiration.The morning of presentation found to have brown vomit coming out of her tracheostomy site. She has been at [facility] since 09/22/2015. Review the chart demonstrates that she was hospitalized in December 2016 secondary to respiratory distress and was found to also have a small bowel obstruction at that time. During her hospitalizations at the prior LTAC she is noted to have C. difficile cA chest x-ray and a KUB obtained at the [facility] prior to transfer demonstrated aspiration pneumonia as small bowel obstruction. On arrival to the emergency department her gastrostomy tube was hooked up to suction and 2 and half liters of dark coffee-ground material was suctioned from her gastrostomy tube.   2/24:  -Renal function improving -Urine culture growing yeast -2/2 blood cultures growing Pseudomonas Aeruginosa (S to gent, cipro, imi; R to pip/taz) and CoNS (sensitivities pending) -Trach aspirate growing Pseudomonas Aeruginosa (S gent, cipro, tobra; R cefepime, ceftaz, imi) -CDiff PCR negative though RN reports pt continues to have frequent stools (flexi-seal in place)  Plan:  ID recommends changing to high-dose ciprofloxacin for Pseudomonas and continuing vancomycin for CDiff Fluconazole added for yeast UTI Adjust vancomycin to 1g q24h for renal function -f/u SCr, cultures, clinical course, VT at Css as warranted  Height:  (154.9 cm) Weight: 107 lb 9.4 oz (48.8 kg) IBW/kg (Calculated) :  47.8  Temp (24hrs), Avg:98.1 F (36.7 C), Min:97.4 F (36.3 C), Max:98.8 F (37.1 C)   Recent Labs Lab 09/26/15 1030 09/26/15 1049 09/26/15 1440 09/27/15 0410 09/28/15 0432 09/29/15 0451  WBC 23.6*  --  29.3* 20.1*  --  12.2*  CREATININE 1.43*  --   --  1.34*  --  0.73  LATICACIDVEN  --  3.92* 2.1*  --  1.0  --     Estimated Creatinine Clearance: 49.4 mL/min (by C-G formula based on Cr of 0.73).    Allergies  Allergen Reactions  . Codeine     Unknown reaction, listed on MAR    Antimicrobials this admission: 2/24 Zosyn >>  2/26 2/24 Vancomycin >>  2/21 (PTA) PO vanco >> 2/24 2/24 Rectal vanco >> 2/26 imipenem >> 2/27 2/27 Cipro >> 2/27 Fluconazole >>  Dose adjustments this admission: 2/27 Vanc  IV q24h --> 1g IV q24h  Microbiology results: 2/24 BCx:  P.  Aeruginosa (S to gent, cipro, imi; R to pip/taz), CoNS 2/2 (susp pending) 2/24 Urine: >100K yeast 2/24 MRSA PCR: Positive 2/24 trach aspirate: P. Aeruginosa (S gent, cipro, tobra; R cefepime, ceftaz, imi) 2/27 CDiff PCR: Negative  Thank you for allowing pharmacy to be a part of this patient's care.  Haynes Hoehn, PharmD, BCPS 09/29/2015, 2:06 PM  Pager: [phone]

## 2015-09-29 NOTE — Progress Notes (Signed)
[facility] ADULT ICU REPLACEMENT PROTOCOL FOR AM LAB REPLACEMENT ONLY  The patient does apply for the [facility] Adult ICU Electrolyte Replacment Protocol based on the criteria listed below:   1. Is GFR >/= 40 ml/min? Yes.    Patient's GFR today is >60 2. Is urine output >/= 0.5 ml/kg/hr for the last 6 hours? Yes.   Patient's UOP is 1.0 ml/kg/hr 3. Is BUN < 60 mg/dL? Yes.    Patient's BUN today is 18 4. Abnormal electrolyte(s): K 2.1 5. Ordered repletion with: per protocol 6. If a panic level lab has been reported, has the CCM MD in charge been notified? No..   Physician:    Markus Daft A 09/29/2015 5:28 AM

## 2015-09-29 NOTE — Progress Notes (Signed)
[facility] Progress Note     Subjective: Awake and alert.  Mouths words, but not able to understand her.  No N/V, no abdominal pain.  Having BM's (has rectal tube).  Doesn't know if she's passed gas.  Still requiring 40% O2 on vent.  Objective: Vital signs in last 24 hours: Temp:  [97.4 F (36.3 C)-99 F (37.2 C)] 97.4 F (36.3 C) (02/27 0731) Pulse Rate:  [82-110] 84 (02/27 1124) Resp:  [18-33] 23 (02/27 1124) BP: (136-167)/(52-85) 142/80 mmHg (02/27 1124) SpO2:  [95 %-100 %] 100 % (02/27 1124) FiO2 (%):  [40 %] 40 % (02/27 1124) Weight:  [48.8 kg (107 lb 9.4 oz)] 48.8 kg (107 lb 9.4 oz) (02/27 0500) Last BM Date: 09/29/15  Intake/Output from previous day: 02/26 0701 - 02/27 0700 In: 2719 [I.V.:1500; NG/GT:30; IV Piggyback:869] Out: 2100 [Urine:1400; Drains:200; Stool:500] Intake/Output this shift: Total I/O In: 625 [I.V.:525; IV Piggyback:100] Out: 600 [Emesis/NG output:100; Drains:500]  PE: Gen:  Alert, NAD, pleasant Card:  RRR, no M/G/R heard Pulm:  Trach in place, 40% FIo2, no W/R/R Abd: Soft, NT/ND, +BS, no HSM, midline incision well healed Ext:  No erythema, edema, or tenderness   Lab Results:   Recent Labs  09/27/15 0410 09/29/15 0451  WBC 20.1* 12.2*  HGB 8.5* 7.9*  HCT 27.2* 26.0*  PLT 390 314   BMET  Recent Labs  09/27/15 0410 09/29/15 0451  NA 141 140  K 2.9* 2.1*  CL 99* 104  CO2 29 28  GLUCOSE 166* 190*  BUN 64* 18  CREATININE 1.34* 0.73  CALCIUM 8.1* 8.3*   PT/INR No results for input(s): LABPROT, INR in the last 72 hours. CMP     Component Value Date/Time   NA 140 09/29/2015 0451   K 2.1* 09/29/2015 0451   CL 104 09/29/2015 0451   CO2 28 09/29/2015 0451   GLUCOSE 190* 09/29/2015 0451   BUN 18 09/29/2015 0451   CREATININE 0.73 09/29/2015 0451   CALCIUM 8.3* 09/29/2015 0451   PROT 6.8 09/26/2015 1030   ALBUMIN 3.2* 09/26/2015 1030   AST 30 09/26/2015 1030   ALT 17 09/26/2015 1030   ALKPHOS 128* 09/26/2015 1030    BILITOT 0.5 09/26/2015 1030   GFRNONAA >60 09/29/2015 0451   GFRAA >60 09/29/2015 0451   Lipase  No results found for: LIPASE     Studies/Results: Dg Abd Portable 1v  09/29/2015  CLINICAL DATA:  Small bowel obstruction. EXAM: PORTABLE ABDOMEN - 1 VIEW COMPARISON:  Three days ago FINDINGS: Nasogastric tube tip is at the distal stomach. Percutaneous gastrostomy tube is present. Previous bowel surgery in the right abdomen. Additional changes of cholecystectomy and ventral hernia repair. Lucent appearance of the upper abdomen without Rigler or other convincing sign of pneumoperitoneum. Multiple the linear low-density interfaces are skin folds or fat planes. Percutaneous gastrostomy tube which likely traverses the left liver based on recent abdominal CT. There is oral contrast in the left upper quadrant which is likely in decompressed colon. Small bowel distention has mildly improved since previous CT, but there is still a transition from gas distended bowel to decompressed colon (patient status post right hemicolectomy). Grossly clear lung bases. IMPRESSION: Mild improvement in small bowel distention. Oral contrast administered at nasogastric tube placement has likely reached the splenic flexure. Electronically Signed   By: Marnee Spring M.D.   On: 09/29/2015 07:11   Dg Basil Dess Tube Plc W/fl W/rad  09/27/2015  CLINICAL DATA:  70 year old with small bowel obstruction.  Patient has a gastrostomy tube and surgery has requested placement of nasogastric tube for additional decompression. EXAM: NASO G TUBE PLACEMENT WITH FL AND WITH RAD CONTRAST:  50mL OMNIPAQUE IOHEXOL 300 MG/ML  SOLN FLUOROSCOPY TIME:  Radiation Exposure Index (as provided by the fluoroscopic device): 25.67 mGy If the device does not provide the exposure index: Fluoroscopy Time (in minutes and seconds):  4 minutes and 27 seconds COMPARISON:  Abdominal CT 09/26/2015 FINDINGS: A 14 French nasogastric tube was directed into the left nostril  with difficulty. The tube would not advance beyond the proximal esophagus with fluoroscopy. As a result, a 5 [place] vascular catheter and Amplatz wire were used to get access beyond the proximal esophagus. Catheter and wire were advanced into the stomach. The 14 French nasogastric tube was advanced over the wire and placed in the stomach. Approximately 10 mL of Omnipaque 300 was injected to confirm placement in the stomach. Catheter was secured to the nose. Catheter was flushed with water at end of the procedure. IMPRESSION: Successful placement of a nasogastric tube using over-the-wire technique. Review of the recent abdominal CT suggests that the gastrostomy tube traverses the lateral margin of the left hepatic lobe. This was discussed with Dr. Johna Sheriff. Electronically Signed   By: Richarda Overlie M.D.   On: 09/27/2015 12:58    Anti-infectives: Anti-infectives    Start     Dose/Rate Route Frequency Ordered Stop   09/29/15 2000  ciprofloxacin (CIPRO) IVPB 400 mg     400 mg 200 mL/hr over 60 Minutes Intravenous Every 8 hours 09/29/15 1040     09/29/15 1200  fluconazole (DIFLUCAN) IVPB 100 mg     100 mg 50 mL/hr over 60 Minutes Intravenous Every 24 hours 09/29/15 1046     09/29/15 1045  ciprofloxacin (CIPRO) IVPB 400 mg     400 mg 200 mL/hr over 60 Minutes Intravenous NOW 09/29/15 1040 09/30/15 1045   09/28/15 1400  imipenem-cilastatin (PRIMAXIN) 250 mg in sodium chloride 0.9 % 100 mL IVPB  Status:  Discontinued     250 mg 200 mL/hr over 30 Minutes Intravenous 3 times per day 09/28/15 1202 09/29/15 1036   09/27/15 1200  vancomycin (VANCOCIN) 500 mg in sodium chloride 0.9 % 100 mL IVPB     500 mg 100 mL/hr over 60 Minutes Intravenous Every 24 hours 09/26/15 1126     09/26/15 1800  piperacillin-tazobactam (ZOSYN) IVPB 3.375 g  Status:  Discontinued     3.375 g 12.5 mL/hr over 240 Minutes Intravenous Every 8 hours 09/26/15 1126 09/28/15 1157   09/26/15 1800  vancomycin (VANCOCIN) 500 mg in sodium  chloride irrigation 0.9 % 100 mL ENEMA     500 mg Rectal 4 times per day 09/26/15 1654     09/26/15 1400  vancomycin (VANCOCIN) 50 mg/mL oral solution 500 mg  Status:  Discontinued     500 mg Oral 4 times per day 09/26/15 1315 09/26/15 1729   09/26/15 1130  vancomycin (VANCOCIN) IVPB 1000 mg/200 mL premix     1,000 mg 200 mL/hr over 60 Minutes Intravenous  Once 09/26/15 1118 09/26/15 1304   09/26/15 1115  piperacillin-tazobactam (ZOSYN) IVPB 3.375 g     3.375 g 100 mL/hr over 30 Minutes Intravenous  Once 09/26/15 1104 09/26/15 1234       Assessment/Plan HD #4 Ileus vs SBO s/p recent colon surgery from adhesive SBO, also has PEG -Admitted 09/26/15, Surgery in December 2016 for SBO, had right hemicolectomy for SBO/adhesions from prior hernia repair -  Transferred from outside hospital to [facility] on 09/22/15 -Xray shows improvement, contrast in splenic flexure.  Clinically does not have a SBO or ileus at this point -D/c NG tube, G tube to gravity, primary wanted SLP evaluation given recent aspiration pneumonia, but we would be okay with sips/chips if cleared -Nurse reports to me that palliative is coming to see the patient, but they have had some difficulty explaining the patients situation to her son, and he apparently hung up on them.  Aspiration pneumonia with chronic respiratory failure with trach Sacral decubitus ulcer C.diff Leukocytosis     LOS: 3 days    Nonie Hoyer 09/29/2015, 11:30 AM Pager: [phone]  (7am - 4:30pm M-F; 7am - 11:30am Sa/Su)

## 2015-09-29 NOTE — Care Management Note (Signed)
Case Management Note  Patient Details  Name: Kristen Arnold MRN: [id number] Date of Birth: 1945/12/18  Subjective/Objective:         resp aspiration pna requiring ventilation           Action/Plan:Date: September 29, 2015 Chart reviewed for concurrent status and case management needs. Will continue to follow patient for changes and needs: Marcelle Smiling, BSN, RN, [place]   [phone]   Expected Discharge Date:   (unknown)               Expected Discharge Plan:  Skilled Nursing Facility  In-House Referral:  Clinical Social Work  Discharge planning Services  CM Consult  Post Acute Care Choice:  NA Choice offered to:  NA  DME Arranged:    DME Agency:     HH Arranged:    HH Agency:     Status of Service:  Completed, signed off  Medicare Important Message Given:    Date Medicare IM Given:    Medicare IM give by:    Date Additional Medicare IM Given:    Additional Medicare Important Message give by:     If discussed at Long Length of Stay Meetings, dates discussed:    Additional Comments:  Golda Acre, RN 09/29/2015, 10:52 AM

## 2015-09-29 NOTE — Progress Notes (Signed)
PULMONARY / CRITICAL CARE MEDICINE   Name: Kristen Arnold MRN: [id number] DOB: 06-16-1946    ADMISSION DATE:  09/26/2015 CONSULTATION DATE:  09/26/2015  REFERRING MD:  Patria Mane, EDP  CHIEF COMPLAINT:  Sent from facility for aspiration and concern for bowel obstruction  BRIEF 70 year old female with COPD and narcotic dependence. In December 2016 she was admitted from an emergency department with findings of a bowel obstruction, renal failure, and lactic acidosis. She underwent a right hemicolectomy in the setting of bowel obstruction which was felt to be related to adhesions from a prior hernia repair. Postoperatively she was unable to be weaned from a ventilator so ultimately a tracheostomy was performed. She's had several infectious complications including an enterococcus UTI, MRSA pneumonia, Pseudomonas pneumonia, and Pseudomonas related wound infection, and C. difficile. She was transferred to [facility] this week in the setting of prolonged mechanical ventilation. She was just admitted to [facility] on 09/22/2015 after above prolonged hospitalization.   She was then brought to the [facility] emergency department on 09/26/2015 asked her she was found to have presumed aspiration pneumonia in the setting of a possible small bowel obstruction. On the morning of 09/26/2015 she was found to have significant brown secretions from her mouth and it was felt that she had aspiration pneumonia. A chest x-ray showed findings worrisome for pneumonia. A KUB apparently showed findings concerning for a small bowel obstruction. In the [facility] emergency department she was noted to have brown, coffee ground bili is secretions from her G-tube. Of note, she was found to have erosive esophagitis in one of the health care facilities that cared for her prior to [facility].She was initially hypotensive in the [facility] emergency department but she has responded to IV fluids. Pulmonary and  critical care medicine was consulted for admission.  LINES/TUBES: PICC left arm (date?) Trach (date?) PEG (date?)    CULTURES: 2/24 Blood cultures> pseudomona & coag neg SA R -to cephalosporins . S - cipro and imipenem February 24 urine cultures: >100K yeast  February 24 respiratory culture:Pseudomonas:  2/24 - MRSA PCR - POSITIVE cdiff PCR 2/27>>>  ANTIBIOTICS: 2/24 Oral vanc >  2/24 Vanc IV >  2/24 Zosyn IV > 2/27 2/27 Cipro >>> 2/27 diflucan>>>  SIGNIFICANT EVENTS & STUDIES 2/24 admission 09/26/2015 CT abdomen and pelvis  09/27/15 - stage 4 5cm sacral decub noticed at admit. Nutrition dx severe malnutrtion at admit.  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 RN could not get NG. CCS recommending IR guided NG.  GNR in aerobic bottles. RASS -4 at admit and now -3 (this is off sedation)./ Not on pressors.    SUBJECTIVE/OVERNIGHT/INTERVAL HX Stable overnight. No acute issues.   VITAL SIGNS: BP 149/70 mmHg  Pulse 84  Temp(Src) 97.4 F (36.3 C) (Oral)  Resp 22  Ht 5\' 1"  (1.549 m)  Wt 107 lb 9.4 oz (48.8 kg)  BMI 20.34 kg/m2  SpO2 100%  HEMODYNAMICS:    VENTILATOR SETTINGS: Vent Mode:  [-] PRVC FiO2 (%):  [40 %] 40 % Set Rate:  [20 bmp] 20 bmp Vt Set:  [450 mL] 450 mL PEEP:  [8 cmH20] 8 cmH20 Plateau Pressure:  [16 cmH20-25 cmH20] 18 cmH20  INTAKE / OUTPUT: I/O last 3 completed shifts: In: 3969 [I.V.:2475; Other:520; NG/GT:30; IV Piggyback:944] Out: 3350 [Urine:2225; Drains:400; Stool:725]  PHYSICAL EXAMINATION: General:  Chronically ill appearing, on vent. Very frail and cachetic Neuro:  Awake. Nods. Seems to understand simple questions, generalized weakness  HEENT:  NCAT, Trach site clean, dry Cardiovascular:  RRR, no mgr Lungs:  Vent supported breaths, normal air entry Abdomen:  Tender to palpation (some guarding), BS+, PEG in place, R surgical scar axillary line well healed Musculoskeletal:  Diminished bulk, tone Skin:  Stage IV sacral  decub  LABS: PULMONARY  Recent Labs Lab 09/26/15 1357  PHART 7.482*  PCO2ART 40.4  PO2ART 64.4*  HCO3 29.9*  TCO2 27.5  O2SAT 90.2    CBC  Recent Labs Lab 09/26/15 1440 09/27/15 0410 09/29/15 0451  HGB 9.7* 8.5* 7.9*  HCT 30.9* 27.2* 26.0*  WBC 29.3* 20.1* 12.2*  PLT 474* 390 314    COAGULATION  Recent Labs Lab 09/26/15 1030  INR 1.20    CARDIAC    Recent Labs Lab 09/26/15 1030 09/28/15 0436  TROPONINI 0.06* 0.11*   No results for input(s): PROBNP in the last 168 hours.   CHEMISTRY  Recent Labs Lab 09/26/15 1030 09/27/15 0410 09/28/15 0436 09/29/15 0451  NA 140 141  --  140  K 3.6 2.9*  --  2.1*  CL 93* 99*  --  104  CO2 30 29  --  28  GLUCOSE 198* 166*  --  190*  BUN 65* 64*  --  18  CREATININE 1.43* 1.34*  --  0.73  CALCIUM 8.8* 8.1*  --  8.3*  MG  --   --  1.6* 2.0  PHOS  --   --  2.0* 2.2*   Estimated Creatinine Clearance: 49.4 mL/min (by C-G formula based on Cr of 0.73).   LIVER  Recent Labs Lab 09/26/15 1030  AST 30  ALT 17  ALKPHOS 128*  BILITOT 0.5  PROT 6.8  ALBUMIN 3.2*  INR 1.20     INFECTIOUS  Recent Labs Lab 09/26/15 1049 09/26/15 1440 09/28/15 0432  LATICACIDVEN 3.92* 2.1* 1.0     ENDOCRINE CBG (last 3)   Recent Labs  09/28/15 1542  GLUCAP 196*    IMAGING x48h  - image(s) personally visualized  -   highlighted in bold Dg Abd Portable 1v  09/29/2015  CLINICAL DATA:  Small bowel obstruction. EXAM: PORTABLE ABDOMEN - 1 VIEW COMPARISON:  Three days ago FINDINGS: Nasogastric tube tip is at the distal stomach. Percutaneous gastrostomy tube is present. Previous bowel surgery in the right abdomen. Additional changes of cholecystectomy and ventral hernia repair. Lucent appearance of the upper abdomen without Rigler or other convincing sign of pneumoperitoneum. Multiple the linear low-density interfaces are skin folds or fat planes. Percutaneous gastrostomy tube which likely traverses the left liver based  on recent abdominal CT. There is oral contrast in the left upper quadrant which is likely in decompressed colon. Small bowel distention has mildly improved since previous CT, but there is still a transition from gas distended bowel to decompressed colon (patient status post right hemicolectomy). Grossly clear lung bases. IMPRESSION: Mild improvement in small bowel distention. Oral contrast administered at nasogastric tube placement has likely reached the splenic flexure. Electronically Signed   By: Marnee Spring M.D.   On: 09/29/2015 07:11   Dg Basil Dess Tube Plc W/fl W/rad  09/27/2015  CLINICAL DATA:  70 year old with small bowel obstruction. Patient has a gastrostomy tube and surgery has requested placement of nasogastric tube for additional decompression. EXAM: NASO G TUBE PLACEMENT WITH FL AND WITH RAD CONTRAST:  50mL OMNIPAQUE IOHEXOL 300 MG/ML  SOLN FLUOROSCOPY TIME:  Radiation Exposure Index (as provided by the fluoroscopic device): 25.67 mGy If the device does not  provide the exposure index: Fluoroscopy Time (in minutes and seconds):  4 minutes and 27 seconds COMPARISON:  Abdominal CT 09/26/2015 FINDINGS: A 14 French nasogastric tube was directed into the left nostril with difficulty. The tube would not advance beyond the proximal esophagus with fluoroscopy. As a result, a 5 [place] vascular catheter and Amplatz wire were used to get access beyond the proximal esophagus. Catheter and wire were advanced into the stomach. The 14 French nasogastric tube was advanced over the wire and placed in the stomach. Approximately 10 mL of Omnipaque 300 was injected to confirm placement in the stomach. Catheter was secured to the nose. Catheter was flushed with water at end of the procedure. IMPRESSION: Successful placement of a nasogastric tube using over-the-wire technique. Review of the recent abdominal CT suggests that the gastrostomy tube traverses the lateral margin of the left hepatic lobe. This was discussed with  Dr. Johna Sheriff. Electronically Signed   By: Richarda Overlie M.D.   On: 09/27/2015 12:58    DISCUSSION: 70 y/o female with COPD, and a recent prolonged hospitalization from a large bowel obstruction leading to prolonged mechanical ventilator support and multiple infectious complications was admitted on 2/24 from [facility] with nausea/vomiting and likely aspiration. Clinically stable. Need to rx hypokalemia if we hope to get bowel functioning again. Flat plate of abd a little better. Will await on general surgery recs re: timing of tube feeds. For now cont supportive care   ASSESSMENT / PLAN:  PULMONARY A: #Baseline  - COPD NOS  #Current Acute on Chronic Resp Failure due to COPD and chronic critical illness S/p tracheostomy - ?Dec 2016 in [place] v [place]   - unable to wean - this is chronic   P:   Full vent support with setting from [facility] Trach care CXR PRN VAP prevention  CARDIOVASCULAR A:  Sinus tachycardia, no shock Lactic acidosis Sepsis   -  Lactate normalized. Not on pressors  P:  Tele monitoring Continue IVF -  D5 1/2  RENAL A:   AKI? No baseline labs available Severe hypokalemia  CT at admit with left hydro   - AKi improving. 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 Maknig urine   P:   Monitor BMET and UOP Aggressive KCL replacement  Replace electrolytes as needed   GASTROINTESTINAL A:   #baseline Recent bowel obstruction dec 2016  #current - concern for UGI bleed at admit - doubt based on clinical profile at followup 09/27/15. Known erosive esophagitis - confirmed SBO at admit 09/26/2015 . RN unable to get NG tube  P:   IR guided NG tube placement as recommended by CCS - since 2/25 PPI gtt -> changed to daily NPO PEG care Hold tube feedings & slow adv when OK by surg team  HEMATOLOGIC A:   Leukocytosis Anemia P:  PRBC for hgb < 7gm% Monitor CBC in setting of possible bleed SCD for DVT prevention  INFECTIOUS A:   #Baseline - dec 2016 to feb 2017 at  outside hospital Stage IV sacral decub ulcer on admission C diff present on admission Recent MRSA pneumonia Recent Pseudomonas pneumonia Recent enterococcus UTI REcent pseudomonas wound infection (sacral) Recent C diff Yeast UTI   #current  - MRSA PCR +  - Psedudonmonas bacteremia and pneumonia - C diff - per RN hx  P:   Spoke w/ ID; will change to Cipro for the Pseudomonas as sensitive in both the blood and sputum Will need at least 1 week to oral vanc after cipro complete  WOC culture F/u culture Diflucan for UTI Change foley  ENDOCRINE A:   No acute issues P:   Monitor glucose  NEUROLOGIC A:   Acute encephalopathy> presumably due to infection Chronic narcotic dependence  - RASS +1 equivalent 09/28/15 without meds and this is improved per RN P:   DC'd all  Opioids and benzo including PRN RASS goal: 0   DERM A Stage 4 decub at admit 09/26/2015 P Wound care consult   FAMILY  - Updates: none bedside 09/26/15 aand 09/27/15 and 09/28/15. RN Amy called son Harvie Heck in my presence . She informed him patient in hospital. Son hung up phone without even asking which hospital and any details  - Inter-disciplinary family meet or Palliative Care meeting due by:  day 7 - 10/02/15. - Her prognosis is very poor. One year mortality for someone with copd in LTAC with multiple infections and now with SBO recurrent is nearly 100%. Will call palliative care   Simonne Martinet ACNP-BC [facility] Pulmonary/Critical Care Pager # [phone] OR # [phone] if no answer    09/29/2015 10:08 AM

## 2015-09-29 NOTE — Clinical Documentation Improvement (Signed)
Critical Care  Can the diagnosis of anemia be further specified? Please document response in next progress note NOT in BPA drop down box. Thanks.   Iron deficiency Anemia  Nutritional anemia, including the nutrition or mineral deficits  Acute Blood Loss Anemia  Acute on Chronic Blood Loss Anemia  Chronic Blood Loss Anemia, including the suspected or known cause  Anemia of chronic disease, including the associated chronic disease state  Other  Clinically Undetermined  Document any associated diagnoses/conditions.  Supporting Information:  H&H's ranging from 10/32 on admission to 8.5/27 with this morning's results  Please exercise your independent, professional judgment when responding. A specific answer is not anticipated or expected.  Thank You,  Shellee Milo RN, BSN, CCDS Health Information Management [place] [phone]; Cell: [phone]

## 2015-09-30 ENCOUNTER — Encounter (HOSPITAL_COMMUNITY): Payer: Self-pay | Admitting: Surgery

## 2015-09-30 DIAGNOSIS — Z515 Encounter for palliative care: Secondary | ICD-10-CM

## 2015-09-30 LAB — BASIC METABOLIC PANEL
ANION GAP: 7 (ref 5–15)
Anion gap: 10 (ref 5–15)
BUN: 14 mg/dL (ref 6–20)
BUN: 10 mg/dL (ref 6–20)
CO2: 24 mmol/L (ref 22–32)
CO2: 24 mmol/L (ref 22–32)
CREATININE: 0.61 mg/dL (ref 0.44–1.00)
Calcium: 8.1 mg/dL — ABNORMAL LOW (ref 8.9–10.3)
Calcium: 8 mg/dL — ABNORMAL LOW (ref 8.9–10.3)
Chloride: 104 mmol/L (ref 101–111)
Chloride: 103 mmol/L (ref 101–111)
Creatinine, Ser: 0.68 mg/dL (ref 0.44–1.00)
GFR calc Af Amer: >60 mL/min (ref >60)
GFR calc Af Amer: >60 mL/min (ref >60)
Glucose, Bld: 160 mg/dL — ABNORMAL HIGH (ref 65–99)
Glucose, Bld: 236 mg/dL — ABNORMAL HIGH (ref 65–99)
POTASSIUM: 4 mmol/L (ref 3.5–5.1)
Potassium: 2.7 mmol/L — CL (ref 3.5–5.1)
SODIUM: 138 mmol/L (ref 135–145)
SODIUM: 134 mmol/L — AB (ref 135–145)

## 2015-09-30 LAB — CBC WITH DIFFERENTIAL/PLATELET
Basophils Absolute: 0 10*3/uL (ref 0.0–0.1)
Basophils Relative: 0 %
Eosinophils Absolute: 0.7 10*3/uL (ref 0.0–0.7)
Eosinophils Relative: 6 %
HCT: 27.3 % — ABNORMAL LOW (ref 36.0–46.0)
Hemoglobin: 8.1 g/dL — ABNORMAL LOW (ref 12.0–15.0)
Lymphocytes Relative: 15 %
Lymphs Abs: 1.6 10*3/uL (ref 0.7–4.0)
MCH: 25.2 pg — ABNORMAL LOW (ref 26.0–34.0)
MCHC: 29.7 g/dL — ABNORMAL LOW (ref 30.0–36.0)
MCV: 84.8 fL (ref 78.0–100.0)
Monocytes Absolute: 0.9 10*3/uL (ref 0.1–1.0)
Monocytes Relative: 8 %
Neutro Abs: 8 10*3/uL — ABNORMAL HIGH (ref 1.7–7.7)
Neutrophils Relative %: 71 %
Platelets: 326 10*3/uL (ref 150–400)
RBC: 3.22 MIL/uL — ABNORMAL LOW (ref 3.87–5.11)
RDW: 15.9 % — ABNORMAL HIGH (ref 11.5–15.5)
WBC: 11.3 10*3/uL — ABNORMAL HIGH (ref 4.0–10.5)

## 2015-09-30 LAB — PHOSPHORUS: PHOSPHORUS: 2.4 mg/dL — AB (ref 2.5–4.6)

## 2015-09-30 LAB — CULTURE, BLOOD (ROUTINE X 2)

## 2015-09-30 LAB — MAGNESIUM
MAGNESIUM: 2.4 mg/dL (ref 1.7–2.4)
Magnesium: 1.5 mg/dL — ABNORMAL LOW (ref 1.7–2.4)

## 2015-09-30 MED ORDER — POTASSIUM CHLORIDE 10 MEQ/50ML IV SOLN
10.0000 meq | INTRAVENOUS | Status: AC
  Administered 2015-09-30 (×8): 10 meq via INTRAVENOUS
  Filled 2015-09-30 (×8): qty 50

## 2015-09-30 MED ORDER — SODIUM CHLORIDE 0.9 % IV SOLN
6.0000 g | Freq: Once | INTRAVENOUS | Status: AC
  Administered 2015-09-30: 6 g via INTRAVENOUS
  Filled 2015-09-30: qty 10

## 2015-09-30 MED ORDER — VANCOMYCIN 50 MG/ML ORAL SOLUTION
500.0000 mg | Freq: Four times a day (QID) | ORAL | Status: DC
  Administered 2015-09-30 – 2015-10-01 (×5): 500 mg via ORAL
  Filled 2015-09-30 (×8): qty 10

## 2015-09-30 MED ORDER — POTASSIUM CHLORIDE 20 MEQ/15ML (10%) PO SOLN
40.0000 meq | Freq: Once | ORAL | Status: AC
  Administered 2015-09-30: 40 meq
  Filled 2015-09-30: qty 30

## 2015-09-30 NOTE — Progress Notes (Signed)
Nutrition Follow-up  DOCUMENTATION CODES:   Severe malnutrition in context of chronic illness, Underweight   INTERVENTION:  -If we begin J-tube feeds, begin Vital 1.2 @ 13mL/hr increase by 10 every 12 hours to goal rate of 92mL/hr. Tube-feeding regimen provides 1152 calories, 72g protein, 780cc free water. -Monitor K, Phos, Mg for at least 3 days, MD replete as necessary, patient is at High risk for refeeding.  NUTRITION DIAGNOSIS:   Inadequate oral intake related to inability to eat as evidenced by NPO status.  GOAL:   Patient will meet greater than or equal to 90% of their needs  MONITOR:   Vent status, Weight trends, Labs, Skin, I & O's  REASON FOR ASSESSMENT:   Ventilator    ASSESSMENT:   70 year old female with COPD and narcotic dependence was brought to the [facility] emergency department on 09/26/2015 asked her she was found to have presumed aspiration pneumonia in the setting of a possible small bowel obstruction. She was just admitted to [facility] on 09/22/2015 after a prolonged hospitalization. In December 2016 she was admitted from an emergency department with findings of a bowel obstruction, renal failure, and lactic acidosis. She underwent a right hemicolectomy in the setting of bowel obstruction which was felt to be related to adhesions from a prior hernia repair. Postoperatively she was unable to be weaned from a ventilator so ultimately a tracheostomy was performed. She's had several infectious complications including an enterococcus UTI, MRSA pneumonia, Pseudomonas pneumonia, and Pseudomonas related wound infection, and C. difficile. She was transferred to [facility] this week in the setting of prolonged mechanical ventilation.  Patient is currently intubated on ventilator support MV: 12.5 L/min Temp (24hrs), Avg:97.9 F (36.6 C), Min:97.3 F (36.3 C), Max:98.7 F (37.1 C)  Propofol: none ml/hr  Pt continues to be on vent,  is alert, mouths answers to questions but unable to understand responses. She is to undergo a SLP swallow eval, followed by a cognitive eval. If she fails swallow eval but is ok cognitively, she will receive a J tube through her G tube. Tube-feeding recs as above.  Septic Shock has resolved, AKI improving. SBO resovled today.  Follow for TF mgmt or diet advancement  Labs: K 2.7, Phos 2.4, Mg 1.5, CBG 196 Medications reviewed.  Diet Order:  Diet NPO time specified  Skin:  Wound (see comment) (R buttocks skin tear)  Last BM:  PTA  Height:   Ht Readings from Last 1 Encounters:  09/26/15  (1.549 m)    Weight:   Wt Readings from Last 1 Encounters:  09/30/15 103 lb 2.8 oz (46.8 kg)    Ideal Body Weight:  52.27 kg (kg)  BMI:  Body mass index is 19.5 kg/(m^2).  Estimated Nutritional Needs:   Kcal:  1258  Protein:  55-70 grams  Fluid:  1.5 L/day  EDUCATION NEEDS:   No education needs identified at this time  Dionne Ano. Kerina Simoneau, MS, RD LDN After Hours/Weekend Pager [phone]

## 2015-09-30 NOTE — Evaluation (Signed)
Speech Language Pathology Evaluation Patient Details Name: Kristen Arnold MRN: [id number] DOB: [birth year] Today's Date: 09/30/2015 Time: 5621-3086 SLP Time Calculation (min) (ACUTE ONLY): 25 min  Problem List:  Patient Active Problem List   Diagnosis Date Noted  . Acute on chronic respiratory failure (HCC) 09/27/2015  . Severe sepsis (HCC) 09/27/2015  . Gram-negative bacteremia (HCC) 09/27/2015  . Tracheostomy status (HCC) 09/27/2015  . Decubitus ulcer of sacral region, stage 4 (HCC) 09/27/2015  . Acute kidney injury (HCC) 09/27/2015  . Absolute anemia   . Chronic respiratory failure with hypoxia and hypercapnia (HCC)   . Small bowel obstruction (HCC) 09/26/2015  . Aspiration pneumonia (HCC) 09/26/2015  . Protein-calorie malnutrition, severe 09/26/2015  . Pressure ulcer 09/26/2015   Past Medical History:  Past Medical History  Diagnosis Date  . Bowel obstruction (HCC)     colectomy 06/2015  . Anemia   . COPD (chronic obstructive pulmonary disease) (HCC)   . Dementia   . Acute on chronic respiratory failure (HCC)   . Anxiety   . Depression   . Diabetes mellitus without complication (HCC)   . DVT (deep venous thrombosis) (HCC)   . Recurrent Clostridium difficile diarrhea   . History of Pseudomonas pneumonia   . Severe protein-calorie malnutrition (HCC)   . Chronic pain    Past Surgical History:  Past Surgical History  Procedure Laterality Date  . Tracheostomy  07/14/2015    [facility]  . Peg tube placement  07/14/2015    [facility]  . Bowel resection      right hemicolectomy, [facility]  . Hemicolectomy Right 06/24/2015  . Bronchoscopies    . Ventral hernia repair  ????  . Incisional hernia repair  06/24/2015    with colectomy   HPI:  70 yo female with complex medical hx including COPD, narcotic dependence, SBO Dec 2016, sacral decub, hernia s/p repair, trach/PEG (unknown when placed) and chronic vent use.  Pt was at [facility] short term prior to  being admitted with aspiration =  brown emesis noted around trach.  Pt required suctioning and 2.5 liters were suctioned.  Per CCS note, pt has poor prognosis.  Swallow and cognitive evaluation ordered.  Palliative team on board following pt.     Note per prior medical history, pt has diagnosis of dementia.     Assessment / Plan / Recommendation Clinical Impression  Pt presents with cogntiive linguistic deficits impacting areas of attention, problem solving, disorientation and ability to follow beyond 1 step directions without visual cues.  Basic Y/N questions answered 80% accurately and pt was oriented to being in the hosptial with verbal choice of 2 (hospital/ball game) .    She did not identify proper year with verbal choice of 2 - saying yes to both years (2001 and 2017).  She did correctly identify her birthday with verbal choice of 2 but not written.  Pt also did not recall prior surgery nor trach/PEG placement.    Pt also benefited from cues to functional tasks including using stronger right hand than left to try to point to large print words.   SLP will continue to work with pt to maximize/further assess her cognitive linguistic abilities.  Pt's ability to comprehend complex information is limited from this limited assessment.  Limited assessment completed, with pt's permission, SLP phoned LTAC SLP therapist.   [facility] SLP reports pt had cognitive linguistic deficits prior to admission suspected to be exacerbated by anxiety/depression.  Pt with decreased attention at [facility]  but was able to follow one step commands.  Decreased participation and increased respiratory needs identified negatively impacting pt's ability to participate in PMSV trials on vent.  Plan was for pt to have a FEES study at [facility] soon, but pt got sick and was sent to the hospital.        SLP Assessment  Patient needs continued Speech Lanaguage Pathology Services    Follow Up Recommendations  LTACH    Frequency and  Duration min 2x/week  2 weeks      SLP Evaluation Prior Functioning  Cognitive/Linguistic Baseline: Baseline deficits Baseline deficit details: SLP phoned SLP from [facility] who reports pt had cognitive linguistic deficits prior to admission suspected to be exacerbated by anxiety/depression.  [facility] SlP noted pt with idecreased attention, able to follow one step commands but decreased participation and increased respiratory needs identified.  Plan was for pt to have a FEES study at [facility] but pt got sick and was sent out.   Type of Home: Other(Comment) ([facility]AC prior to admit)   Cognition  Orientation Level: Oriented to person;Oriented to place;Disoriented to situation;Disoriented to time;Intubated/Tracheostomy - Unable to assess Attention: Sustained Sustained Attention: Impaired Sustained Attention Impairment: Functional basic;Verbal basic Memory: Impaired Memory Impairment:  (pt did not recall h/o SBO and trach/PEG with yes/no questioning, she nodded her head "no" to direct questioning) Problem Solving: Impaired (pt with increased weakness on right than left and did not use left hand to point to words despite multiple verbal, tactile cues ) Problem Solving Impairment: Functional basic Executive Function:  (pt not at this level of functioning)    Comprehension  Auditory Comprehension Overall Auditory Comprehension: Impaired Yes/No Questions: Impaired Basic Biographical Questions: 76-100% accurate Complex Questions: 0-24% accurate Commands: Impaired One Step Basic Commands: 75-100% accurate Two Step Basic Commands: 50-74% accurate Conversation: Simple (re: personal needs) Interfering Components: Attention EffectiveTechniques: Repetition Reading Comprehension Reading Status: Impaired Word level: Impaired (did not identify type of prior surgery nor birthdate from written choice of 2 nor current city with written choice of 3, could articulate cities accurately however) Sentence Level:  Not tested Paragraph Level: Not tested Functional Environmental (signs, name badge): Not tested Interfering Components: Attention (SLP placed SLP's personal reading glasses on pt to use for testing)    Expression Expression Primary Mode of Expression: Other (comment) (nonverbal, did not attempt to write, pt did not point to accurate words on paper despite max cues) Verbal Expression Repetition:  (dnt) Naming: Not tested Written Expression Written Expression: Unable to assess (comment) (pt excessively weak)   Oral / Motor  Oral Motor/Sensory Function Overall Oral Motor/Sensory Function: Generalized oral weakness (generalized weakness) Motor Speech Overall Motor Speech: Other (comment) (pt on ventilator but suspect articulation and intelligiblity would be intact if able to adequately assess) Intelligibility: Unable to assess (comment) Motor Planning: Witnin functional limits Motor Speech Errors: Not applicable Interfering Components: Inadequate dentition   GO                    Donavan Burnet, MS [facility] SLP [phone]

## 2015-09-30 NOTE — Progress Notes (Signed)
PULMONARY / CRITICAL CARE MEDICINE   Name: Kristen Arnold MRN: [id number] DOB: 02/12/1946    ADMISSION DATE:  09/26/2015 CONSULTATION DATE:  09/26/2015  REFERRING MD:  Patria Mane, EDP  CHIEF COMPLAINT:  Sent from facility for aspiration and concern for bowel obstruction  BRIEF 70 year old female with COPD and narcotic dependence. In December 2016 she was admitted from an emergency department with findings of a bowel obstruction, renal failure, and lactic acidosis. She underwent a right hemicolectomy in the setting of bowel obstruction which was felt to be related to adhesions from a prior hernia repair. Postoperatively she was unable to be weaned from a ventilator so ultimately a tracheostomy was performed. She's had several infectious complications including an enterococcus UTI, MRSA pneumonia, Pseudomonas pneumonia, and Pseudomonas related wound infection, and C. difficile. She was transferred to [facility] this week in the setting of prolonged mechanical ventilation. She was just admitted to [facility] long-term acute [facility] facility on 09/22/2015 after above prolonged hospitalization.   She was then brought to the [facility] emergency department on 09/26/2015 asked her she was found to have presumed aspiration pneumonia in the setting of a possible small bowel obstruction. On the morning of 09/26/2015 she was found to have significant brown secretions from her mouth and it was felt that she had aspiration pneumonia. A chest x-ray showed findings worrisome for pneumonia. A KUB apparently showed findings concerning for a small bowel obstruction. In the [facility] emergency department she was noted to have brown, coffee ground bili is secretions from her G-tube. Of note, she was found to have erosive esophagitis in one of the health care facilities that cared for her prior to [facility].She was initially hypotensive in the [facility] emergency department but she has responded to IV fluids. Pulmonary and  critical care medicine was consulted for admission.  LINES/TUBES: PICC left arm (date?) Trach (date?) PEG (date?)    CULTURES: 2/24 Blood cultures> pseudomona & coag neg SA R -to cephalosporins . S - cipro and imipenem February 24 urine cultures: >100K yeast  February 24 respiratory culture:Pseudomonas:  2/24 - MRSA PCR - POSITIVE cdiff PCR 2/27>>>  ANTIBIOTICS: 2/24 Oral vanc >  2/24 Vanc IV >  2/24 Zosyn IV > 2/27 2/27 Cipro >>> 2/27 diflucan>>>  SIGNIFICANT EVENTS & STUDIES 2/24 admission 09/26/2015 CT abdomen and pelvis  09/27/15 - stage 4 5cm sacral decub noticed at admit. Nutrition dx severe malnutrtion at admit.  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 RN could not get NG. CCS recommending IR guided NG.  GNR in aerobic bottles. RASS -4 at admit and now -3 (this is off sedation)./ Not on pressors.    SUBJECTIVE/OVERNIGHT/INTERVAL HX Stable overnight. No acute issues.   VITAL SIGNS: BP 156/88 mmHg  Pulse 98  Temp(Src) 98.2 F (36.8 C) (Oral)  Resp 24  Ht  (1.549 m)  Wt 103 lb 2.8 oz (46.8 kg)  BMI 19.50 kg/m2  SpO2 100%  HEMODYNAMICS:    VENTILATOR SETTINGS: Vent Mode:  [-] PRVC FiO2 (%):  [40 %] 40 % Set Rate:  [20 bmp] 20 bmp Vt Set:  [450 mL] 450 mL PEEP:  [8 cmH20] 8 cmH20 Plateau Pressure:  [17 cmH20-22 cmH20] 21 cmH20  INTAKE / OUTPUT: I/O last 3 completed shifts: In: 5358.1 [I.V.:2864.8; Other:540; IV Piggyback:1953.3] Out: 3500 [Urine:1650; Emesis/NG output:100; Drains:650; Stool:1100]  PHYSICAL EXAMINATION: General:  Chronically ill appearing, on vent. Very frail and cachetic Neuro:  Awake. Nods. Seems to understand simple questions, generalized  weakness HEENT:  NCAT, Trach site clean, dry Cardiovascular:  RRR, no mgr Lungs:  Vent supported breaths, normal air entry, occ rhonchi  Abdomen:  Now non-tender to palpation (some guarding), BS+, PEG in place, R surgical scar axillary line well healed Musculoskeletal:   Diminished bulk, tone Skin:  Stage IV sacral decub  LABS: PULMONARY  Recent Labs Lab 09/26/15 1357  PHART 7.482*  PCO2ART 40.4  PO2ART 64.4*  HCO3 29.9*  TCO2 27.5  O2SAT 90.2    CBC  Recent Labs Lab 09/27/15 0410 09/29/15 0451 09/30/15 0300  HGB 8.5* 7.9* 8.1*  HCT 27.2* 26.0* 27.3*  WBC 20.1* 12.2* 11.3*  PLT 390 314 326    COAGULATION  Recent Labs Lab 09/26/15 1030  INR 1.20    CARDIAC    Recent Labs Lab 09/26/15 1030 09/28/15 0436  TROPONINI 0.06* 0.11*   No results for input(s): PROBNP in the last 168 hours.   CHEMISTRY  Recent Labs Lab 09/26/15 1030 09/27/15 0410 09/28/15 0436 09/29/15 0451 09/29/15 2100 09/30/15 0300  NA 140 141  --  140 139 138  K 3.6 2.9*  --  2.1* 3.2* 2.7*  CL 93* 99*  --  104 105 104  CO2 30 29  --  28 26 24   GLUCOSE 198* 166*  --  190* 187* 160*  BUN 65* 64*  --  18 14 14   CREATININE 1.43* 1.34*  --  0.73 0.69 0.61  CALCIUM 8.8* 8.1*  --  8.3* 8.1* 8.1*  MG  --   --  1.6* 2.0  --  1.5*  PHOS  --   --  2.0* 2.2*  --  2.4*   Estimated Creatinine Clearance: 48.3 mL/min (by C-G formula based on Cr of 0.61).   LIVER  Recent Labs Lab 09/26/15 1030  AST 30  ALT 17  ALKPHOS 128*  BILITOT 0.5  PROT 6.8  ALBUMIN 3.2*  INR 1.20     INFECTIOUS  Recent Labs Lab 09/26/15 1049 09/26/15 1440 09/28/15 0432  LATICACIDVEN 3.92* 2.1* 1.0     ENDOCRINE CBG (last 3)   Recent Labs  09/28/15 1542  GLUCAP 196*    IMAGING x48h  - image(s) personally visualized  -   highlighted in bold Dg Abd Portable 1v  09/29/2015  CLINICAL DATA:  Small bowel obstruction. EXAM: PORTABLE ABDOMEN - 1 VIEW COMPARISON:  Three days ago FINDINGS: Nasogastric tube tip is at the distal stomach. Percutaneous gastrostomy tube is present. Previous bowel surgery in the right abdomen. Additional changes of cholecystectomy and ventral hernia repair. Lucent appearance of the upper abdomen without Rigler or other convincing sign of  pneumoperitoneum. Multiple the linear low-density interfaces are skin folds or fat planes. Percutaneous gastrostomy tube which likely traverses the left liver based on recent abdominal CT. There is oral contrast in the left upper quadrant which is likely in decompressed colon. Small bowel distention has mildly improved since previous CT, but there is still a transition from gas distended bowel to decompressed colon (patient status post right hemicolectomy). Grossly clear lung bases. IMPRESSION: Mild improvement in small bowel distention. Oral contrast administered at nasogastric tube placement has likely reached the splenic flexure. Electronically Signed   By: Marnee Spring M.D.   On: 09/29/2015 07:11    DISCUSSION: 70 y/o female with COPD, and a recent prolonged hospitalization from a large bowel obstruction leading to prolonged mechanical ventilator support and multiple infectious complications was admitted on 2/24 from [facility] with nausea/vomiting and likely aspiration. Clinically  stable. Now cleared to use her gut. Need to rx hypokalemia if we hope to keep bowel fxn. Cipro for pseudomonas PNA and bacteremia. Vanc for coag neg SA, Oral vanc as well given cdiff hx and want to avoid reoccur ance. Today we are awaiting SLP eval for both swallowing and cognitive testing. If fails swallow eval Surgical services are recommending J tube. Would want to see if this is something she is able to consent to before we as family as they seem very removed & indiffernt in regards to her care.    ASSESSMENT / PLAN:  PULMONARY A: #Baseline  - COPD NOS  #Current Acute on Chronic Resp Failure due to COPD and chronic critical illness S/p tracheostomy - ?Dec 2016 [place] v [place]   - unable to wean - this is chronic per records P:   Full vent support with setting from [facility]; but will try ATC  Trach care CXR PRN VAP prevention  CARDIOVASCULAR A:  Sinus tachycardia, no shock-->resolved Lactic  acidosis Sepsis  -  Lactate normalized. Not on pressors  P:  Tele monitoring Continue IVF -  D5 1/2  RENAL A:   AKI? No baseline labs available Severe hypokalemia  CT at admit with left hydro   - AKi improving. 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 Maknig urine P:   Monitor BMET and UOP Aggressive KCL replacement  Replace electrolytes as needed   GASTROINTESTINAL A:   #baseline Recent bowel obstruction dec 2016  #current - concern for UGI bleed at admit - doubt based on clinical profile at followup 09/27/15. Known erosive esophagitis - confirmed SBO at admit 09/26/2015 . RN unable to get NG tube-->clinically cleared to use gut as of 2/28  P:   IR guided NG tube placement as recommended by CCS - since 2/25 PPI gtt -> changed to daily NPO-->pending swallow  PEG care Will do swallowing eval first. Then also have SLP perform cognitive eval as well. If passes cognitive eval but fails swallow eval we will address J tube w/ pt as surgery recommends J tube over G tube should supplemental Diet be indicated.   HEMATOLOGIC A:   Leukocytosis Anemia P:  PRBC for hgb < 7gm% Monitor CBC in setting of possible bleed SCD for DVT prevention  INFECTIOUS A:   #Baseline - dec 2016 to feb 2017 at outside hospital Stage IV sacral decub ulcer on admission C diff present on admission Recent MRSA pneumonia Recent Pseudomonas pneumonia Recent enterococcus UTI REcent pseudomonas wound infection (sacral) Recent C diff Yeast UTI   #current  - MRSA PCR +  - Psedudonmonas bacteremia and pneumonia - C diff - per RN hx but PCR was negative  P:   Spoke w/ ID; will change to Cipro for the Pseudomonas as sensitive in both the blood and sputum Will just go ahead and cont oral vanc (dc enemas)as c diff prophylaxis on the chance that she did indeed have it previously.  WOC culture Diflucan for UTI Will repeat blood cultures on 3/1  ENDOCRINE A:   No acute issues P:   Monitor  glucose  NEUROLOGIC A:   Acute encephalopathy> presumably due to infection Chronic narcotic dependence ? Element of dementia   - RASS +1 equivalent 09/28/15 without meds and this is improved per RN P:   DC'd all  Opioids and benzo including PRN RASS goal: 0 Will ask SLP to also perform cognitive testing   DERM A Stage 4 decub at admit 09/26/2015 P Wound care  consulted   FAMILY  - Updates: none bedside 09/26/15 aand 09/27/15 and 09/28/15. RN Amy called son Harvie Heck in my presence . She informed him patient in hospital. Son hung up phone without even asking which hospital and any details  - Inter-disciplinary family meet or Palliative Care meeting due by:  day 7 - 10/02/15. - Her prognosis is very poor. One year mortality for someone with copd in LTAC with multiple infections and now with SBO recurrent is nearly 100%. Will call palliative care   Simonne Martinet ACNP-BC [facility] Pulmonary/Critical Care Pager # [phone] OR # [phone] if no answer    09/30/2015 9:42 AM

## 2015-09-30 NOTE — Progress Notes (Signed)
[facility] ADULT ICU REPLACEMENT PROTOCOL FOR AM LAB REPLACEMENT ONLY  The patient does apply for the [facility] Adult ICU Electrolyte Replacment Protocol based on the criteria listed below:   1. Is GFR >/= 40 ml/min? Yes.    Patient's GFR today is >60 2. Is urine output >/= 0.5 ml/kg/hr for the last 6 hours? Yes.   Patient's UOP is 0.8 ml/kg/hr 3. Is BUN < 60 mg/dL? Yes.    Patient's BUN today is 14 4. Abnormal electrolyte(s): K 2.7, mag 1.5 5. Ordered repletion with: per protocol 6. If a panic level lab has been reported, has the CCM MD in charge been notified? No..   Physician:    Markus Daft A 09/30/2015 5:30 AM

## 2015-09-30 NOTE — Progress Notes (Signed)
[facility] Progress Note     Subjective: Pt doing much improved today.  Very alert, feels much better.  No N/V or abdominal pain.  Having flatus and BM (has rectal tube in place).  Mildly SOB, on 40% FIO2, no CP or fever/chills.  No pain anywhere else, but still feels weak, cant hold a pencil and write and has trouble communicating due to trach.    Objective: Vital signs in last 24 hours: Temp:  [97.3 F (36.3 C)-98.7 F (37.1 C)] 97.7 F (36.5 C) (02/28 0330) Pulse Rate:  [75-103] 98 (02/28 0729) Resp:  [15-26] 24 (02/28 0729) BP: (135-171)/(57-106) 149/97 mmHg (02/28 0729) SpO2:  [91 %-100 %] 99 % (02/28 0729) FiO2 (%):  [40 %] 40 % (02/28 0729) Weight:  [46.8 kg (103 lb 2.8 oz)] 46.8 kg (103 lb 2.8 oz) (02/28 0245) Last BM Date:  (rectal tube)  Intake/Output from previous day: 02/27 0701 - 02/28 0700 In: 4313.1 [I.V.:2269.8; IV Piggyback:1703.3] Out: 2050 [Urine:900; Emesis/NG output:100; Drains:650; Stool:400] Intake/Output this shift:    PE: Gen:  Alert, NAD, pleasant Card:  RRR, no M/G/R heard Pulm:  Vent sounds, no significant W/R/R Abd: Soft, NT/ND, +BS, no HSM, incision well healed, PEG to LIW suction with drainage   Lab Results:   Recent Labs  09/29/15 0451 09/30/15 0300  WBC 12.2* 11.3*  HGB 7.9* 8.1*  HCT 26.0* 27.3*  PLT 314 326   BMET  Recent Labs  09/29/15 2100 09/30/15 0300  NA 139 138  K 3.2* 2.7*  CL 105 104  CO2 26 24  GLUCOSE 187* 160*  BUN 14 14  CREATININE 0.69 0.61  CALCIUM 8.1* 8.1*   PT/INR No results for input(s): LABPROT, INR in the last 72 hours. CMP     Component Value Date/Time   NA 138 09/30/2015 0300   K 2.7* 09/30/2015 0300   CL 104 09/30/2015 0300   CO2 24 09/30/2015 0300   GLUCOSE 160* 09/30/2015 0300   BUN 14 09/30/2015 0300   CREATININE 0.61 09/30/2015 0300   CALCIUM 8.1* 09/30/2015 0300   PROT 6.8 09/26/2015 1030   ALBUMIN 3.2* 09/26/2015 1030   AST 30 09/26/2015 1030   ALT 17  09/26/2015 1030   ALKPHOS 128* 09/26/2015 1030   BILITOT 0.5 09/26/2015 1030   GFRNONAA >60 09/30/2015 0300   GFRAA >60 09/30/2015 0300   Lipase  No results found for: LIPASE     Studies/Results: Dg Abd Portable 1v  09/29/2015  CLINICAL DATA:  Small bowel obstruction. EXAM: PORTABLE ABDOMEN - 1 VIEW COMPARISON:  Three days ago FINDINGS: Nasogastric tube tip is at the distal stomach. Percutaneous gastrostomy tube is present. Previous bowel surgery in the right abdomen. Additional changes of cholecystectomy and ventral hernia repair. Lucent appearance of the upper abdomen without Rigler or other convincing sign of pneumoperitoneum. Multiple the linear low-density interfaces are skin folds or fat planes. Percutaneous gastrostomy tube which likely traverses the left liver based on recent abdominal CT. There is oral contrast in the left upper quadrant which is likely in decompressed colon. Small bowel distention has mildly improved since previous CT, but there is still a transition from gas distended bowel to decompressed colon (patient status post right hemicolectomy). Grossly clear lung bases. IMPRESSION: Mild improvement in small bowel distention. Oral contrast administered at nasogastric tube placement has likely reached the splenic flexure. Electronically Signed   By: Marnee Spring M.D.   On: 09/29/2015 07:11    Anti-infectives: Anti-infectives  Start     Dose/Rate Route Frequency Ordered Stop   09/29/15 2200  vancomycin (VANCOCIN) IVPB 1000 mg/200 mL premix     1,000 mg 200 mL/hr over 60 Minutes Intravenous Every 24 hours 09/29/15 1359     09/29/15 2000  ciprofloxacin (CIPRO) IVPB 400 mg     400 mg 200 mL/hr over 60 Minutes Intravenous Every 8 hours 09/29/15 1040     09/29/15 1200  fluconazole (DIFLUCAN) IVPB 100 mg     100 mg 50 mL/hr over 60 Minutes Intravenous Every 24 hours 09/29/15 1046     09/29/15 1045  ciprofloxacin (CIPRO) IVPB 400 mg     400 mg 200 mL/hr over 60 Minutes  Intravenous NOW 09/29/15 1040 09/29/15 1218   09/28/15 1400  imipenem-cilastatin (PRIMAXIN) 250 mg in sodium chloride 0.9 % 100 mL IVPB  Status:  Discontinued     250 mg 200 mL/hr over 30 Minutes Intravenous 3 times per day 09/28/15 1202 09/29/15 1036   09/27/15 1200  vancomycin (VANCOCIN) 500 mg in sodium chloride 0.9 % 100 mL IVPB  Status:  Discontinued     500 mg 100 mL/hr over 60 Minutes Intravenous Every 24 hours 09/26/15 1126 09/29/15 1357   09/26/15 1800  piperacillin-tazobactam (ZOSYN) IVPB 3.375 g  Status:  Discontinued     3.375 g 12.5 mL/hr over 240 Minutes Intravenous Every 8 hours 09/26/15 1126 09/28/15 1157   09/26/15 1800  vancomycin (VANCOCIN) 500 mg in sodium chloride irrigation 0.9 % 100 mL ENEMA     500 mg Rectal 4 times per day 09/26/15 1654     09/26/15 1400  vancomycin (VANCOCIN) 50 mg/mL oral solution 500 mg  Status:  Discontinued     500 mg Oral 4 times per day 09/26/15 1315 09/26/15 1729   09/26/15 1130  vancomycin (VANCOCIN) IVPB 1000 mg/200 mL premix     1,000 mg 200 mL/hr over 60 Minutes Intravenous  Once 09/26/15 1118 09/26/15 1304   09/26/15 1115  piperacillin-tazobactam (ZOSYN) IVPB 3.375 g     3.375 g 100 mL/hr over 30 Minutes Intravenous  Once 09/26/15 1104 09/26/15 1234       Assessment/Plan HD #5 Ileus s/p recent colon surgery from adhesive SBO, also has PEG -Admitted 09/26/15, Surgery in December 2016 for SBO, reportedly had right hemicolectomy for SBO/adhesions from prior hernia repair -Transferred from outside hospital to [facility] on 09/22/15 -Xray shows improvement, contrast in splenic flexure. Clinically does not have a SBO or ileus at this point.   -NG removed yesterday, clamp PEG tube, pending SLP evaluation given recent aspiration pneumonia, but we would be okay with fulls or d1 diet if cleared by SLP or Tube feeds.  If tube feeds are desired would consult IR to place J tube through PEG to reduce aspiration risk and feed through J tube  port. -Will follow one more day to ensure she is able to start a diet  Aspiration pneumonia with chronic respiratory failure with trach Sacral decubitus ulcer C.diff Leukocytosis - improved to 11.3 Hypokalemia - being supplemented by primary Disp - back to [facility] once stable?    LOS: 4 days    Nonie Hoyer 09/30/2015, 7:44 AM Pager: [phone]  (7am - 4:30pm M-F; 7am - 11:30am Sa/Su)

## 2015-09-30 NOTE — Evaluation (Signed)
Clinical/Bedside Swallow Evaluation Patient Details  Name: Kristen Arnold MRN: [id number] Date of Birth: [birth year]  Today's Date: 09/30/2015 Time: SLP Start Time (ACUTE ONLY): 1245 SLP Stop Time (ACUTE ONLY): 1300 SLP Time Calculation (min) (ACUTE ONLY): 15 min  Past Medical History:  Past Medical History  Diagnosis Date  . Bowel obstruction (HCC)     colectomy 06/2015  . Anemia   . COPD (chronic obstructive pulmonary disease) (HCC)   . Dementia   . Acute on chronic respiratory failure (HCC)   . Anxiety   . Depression   . Diabetes mellitus without complication (HCC)   . DVT (deep venous thrombosis) (HCC)   . Recurrent Clostridium difficile diarrhea   . History of Pseudomonas pneumonia   . Severe protein-calorie malnutrition (HCC)   . Chronic pain    Past Surgical History:  Past Surgical History  Procedure Laterality Date  . Tracheostomy  07/14/2015    [facility]  . Peg tube placement  07/14/2015    [facility]  . Bowel resection      right hemicolectomy, [facility]  . Hemicolectomy Right 06/24/2015  . Bronchoscopies    . Ventral hernia repair  ????  . Incisional hernia repair  06/24/2015    with colectomy   HPI:  70 yo female with complex medical hx including COPD, narcotic dependence, SBO Dec 2016, sacral decub, hernia s/p repair, trach/PEG (unknown when placed) and chronic vent use.  Pt was at [facility] short term prior to being admitted with aspiration =  brown emesis noted around treach.  Pt required suctioning and 2.5 liters were suctioned.  Per [facility] note, pt has poor prognosis.  Swallow and cognitive evaluation ordered.  Palliative team on board following pt.     Assessment / Plan / Recommendation Clinical Impression  Pt presents with symptoms concerning for significant pharyngeal sensorimotor deficits due to deconditioning and trach.  CN exam largely unremarkable - sluggish palatal elevation without pt abilty to phonate as she is on a vent.  Trialed  only 2 small single boluses of ice.  Instructed pt to allow ice to melt completely and swallow.  Pt articulated "I swallowed" when swallow had not been elicited.  With second bolus and verbal cue, pt did finally swallow x1 only.  Poor laryngeal elevation observed at bedside with reflexive cough post=swallow concerning for aspiration.  When pt was asked if felt it went the wrong way, she articulated yes.  RN was asked to come into room and suction pt due to aspiration concerns. Minimal amount suctioned and RN reports pt has productive suctioning on occasion.     SlP would not recommend consideration for po diet without instrumental swallow evaluation due to pt's complex medical issues and symptoms of pharyngeal dysphagia.   Of note, per SLP at [facility], plan was for pt to have a FEES study.  However given pt's acute medical illness, symptoms of significant dysphagia at bedside, do not anticipate pt will have a functional swallow to transition to po at this time.      Aspiration Risk  Risk for inadequate nutrition/hydration;Severe aspiration risk    Diet Recommendation NPO (oral care)        Other  Recommendations Oral Care Recommendations: Oral care QID   Follow up Recommendations  LTACH    Frequency and Duration min 2x/week          Prognosis   ??     Swallow Study   General Date of Onset: 09/30/15 HPI: 70  yo female with complex medical hx including COPD, narcotic dependence, SBO Dec 2016, sacral decub, hernia s/p repair, trach/PEG (unknown when placed) and chronic vent use.  Pt was at [facility] short term prior to being admitted with aspiration =  brown emesis noted around treach.  Pt required suctioning and 2.5 liters were suctioned.  Per [facility] note, pt has poor prognosis.  Swallow and cognitive evaluation ordered.  Palliative team on board following pt.   Type of Study: Bedside Swallow Evaluation Diet Prior to this Study: NPO;PEG tube Temperature Spikes Noted: No Respiratory Status:  Ventilator;Trach Trach Size and Type: #8;Cuff;Other (Comment) (on vent) Behavior/Cognition: Alert;Cooperative Oral Cavity Assessment: Within Functional Limits Oral Care Completed by SLP: No Oral Cavity - Dentition: Edentulous Self-Feeding Abilities: Other (Comment) (dnt) Patient Positioning: Upright in bed Baseline Vocal Quality: Not observed Volitional Cough: Other (Comment) (pt did not cough per cue) Volitional Swallow: Unable to elicit    Oral/Motor/Sensory Function Overall Oral Motor/Sensory Function: Generalized oral weakness (generalized weakness)   Ice Chips Ice chips: Impaired Presentation: Spoon Pharyngeal Phase Impairments: Suspected delayed Swallow;Decreased hyoid-laryngeal movement;Cough - Delayed   Thin Liquid Thin Liquid: Not tested    Nectar Thick Nectar Thick Liquid: Not tested   Honey Thick Honey Thick Liquid: Not tested   Puree Puree: Not tested   Solid   GO   Solid: Not tested        Mills Koller, MS [facility] SLP [phone]

## 2015-09-30 NOTE — Progress Notes (Signed)
Inpatient Diabetes Program Recommendations  AACE/ADA: New Consensus Statement on Inpatient Glycemic Control (2015)  Target Ranges:  Prepandial:   less than 140 mg/dL      Peak postprandial:   less than 180 mg/dL (1-2 hours)      Critically ill patients:  140 - 180 mg/dL    Results for Kristen Arnold, Kristen Arnold (MRN [id number]) as of 09/30/2015 08:57  Ref. Range 09/26/2015 10:30 09/27/2015 04:10 09/29/2015 04:51 09/29/2015 21:00 09/30/2015 03:00  Glucose Latest Ref Range: 65-99 mg/dL 409 (H) 811 (H) 914 (H) 187 (H) 160 (H)   History: DM  Home DM Meds: Novolog 2-10 units tidac per SSI  Current Insulin Orders: None     MD- Please consider placing order for Novolog Sensitive Correction Scale/ SSI (0-9 units) Q4 hours     --Will follow patient during hospitalization--  Ambrose Finland RN, MSN, CDE Diabetes Coordinator Inpatient Glycemic Control Team Team Pager: [phone] (8a-5p)

## 2015-10-01 ENCOUNTER — Inpatient Hospital Stay (HOSPITAL_COMMUNITY): Payer: Medicare Other

## 2015-10-01 DIAGNOSIS — J9620 Acute and chronic respiratory failure, unspecified whether with hypoxia or hypercapnia: Secondary | ICD-10-CM

## 2015-10-01 LAB — CBC WITH DIFFERENTIAL/PLATELET
BASOS PCT: 0 %
Basophils Absolute: 0 10*3/uL (ref 0.0–0.1)
EOS ABS: 0.8 10*3/uL — AB (ref 0.0–0.7)
Eosinophils Relative: 8 %
HCT: 28 % — ABNORMAL LOW (ref 36.0–46.0)
HEMOGLOBIN: 8.6 g/dL — AB (ref 12.0–15.0)
LYMPHS ABS: 1.5 10*3/uL (ref 0.7–4.0)
Lymphocytes Relative: 14 %
MCH: 26 pg (ref 26.0–34.0)
MCHC: 30.7 g/dL (ref 30.0–36.0)
MCV: 84.6 fL (ref 78.0–100.0)
Monocytes Absolute: 0.9 10*3/uL (ref 0.1–1.0)
Monocytes Relative: 8 %
NEUTROS PCT: 70 %
Neutro Abs: 7.1 10*3/uL (ref 1.7–7.7)
Platelets: 367 10*3/uL (ref 150–400)
RBC: 3.31 MIL/uL — AB (ref 3.87–5.11)
RDW: 16.3 % — ABNORMAL HIGH (ref 11.5–15.5)
WBC: 10.2 10*3/uL (ref 4.0–10.5)

## 2015-10-01 LAB — BASIC METABOLIC PANEL
ANION GAP: 7 (ref 5–15)
BUN: 10 mg/dL (ref 6–20)
CALCIUM: 8.3 mg/dL — AB (ref 8.9–10.3)
CO2: 24 mmol/L (ref 22–32)
Chloride: 105 mmol/L (ref 101–111)
Creatinine, Ser: 0.59 mg/dL (ref 0.44–1.00)
GFR calc Af Amer: >60 mL/min (ref >60)
GLUCOSE: 178 mg/dL — AB (ref 65–99)
POTASSIUM: 3.4 mmol/L — AB (ref 3.5–5.1)
Sodium: 136 mmol/L (ref 135–145)

## 2015-10-01 LAB — MAGNESIUM: MAGNESIUM: 2.1 mg/dL (ref 1.7–2.4)

## 2015-10-01 LAB — PHOSPHORUS: PHOSPHORUS: 2.9 mg/dL (ref 2.5–4.6)

## 2015-10-01 MED ORDER — VANCOMYCIN HCL IN DEXTROSE 1-5 GM/200ML-% IV SOLN: 1000.0000 mg | INTRAVENOUS | Status: DC

## 2015-10-01 MED ORDER — IOHEXOL 300 MG/ML  SOLN
20.0000 mL | Freq: Once | INTRAMUSCULAR | Status: AC | PRN
  Administered 2015-10-01: 20 mL

## 2015-10-01 MED ORDER — FLUCONAZOLE IN SODIUM CHLORIDE 100-0.9 MG/50ML-% IV SOLN: 100.0000 mg | INTRAVENOUS | Status: DC

## 2015-10-01 MED ORDER — DEXTROSE-NACL 5-0.45 % IV SOLN: INTRAVENOUS | Status: DC

## 2015-10-01 MED ORDER — VITAL AF 1.2 CAL PO LIQD: 1000.0000 mL | ORAL | Status: DC

## 2015-10-01 MED ORDER — METOCLOPRAMIDE HCL 5 MG/ML IJ SOLN
10.0000 mg | Freq: Four times a day (QID) | INTRAMUSCULAR | Status: DC
  Administered 2015-10-01: 10 mg via INTRAVENOUS
  Filled 2015-10-01: qty 2

## 2015-10-01 MED ORDER — VITAL AF 1.2 CAL PO LIQD
1000.0000 mL | ORAL | Status: DC
  Filled 2015-10-01: qty 1000

## 2015-10-01 MED ORDER — VANCOMYCIN 50 MG/ML ORAL SOLUTION: 500.0000 mg | Freq: Four times a day (QID) | ORAL | Status: DC

## 2015-10-01 MED ORDER — POTASSIUM CHLORIDE 20 MEQ/15ML (10%) PO SOLN: 40.0000 meq | Freq: Every day | ORAL | Status: DC

## 2015-10-01 MED ORDER — VITAL HIGH PROTEIN PO LIQD
1000.0000 mL | ORAL | Status: DC
  Filled 2015-10-01: qty 1000

## 2015-10-01 MED ORDER — POTASSIUM CHLORIDE 20 MEQ/15ML (10%) PO SOLN
40.0000 meq | ORAL | Status: DC
  Administered 2015-10-01 (×2): 40 meq
  Filled 2015-10-01 (×2): qty 30

## 2015-10-01 MED ORDER — CIPROFLOXACIN IN D5W 400 MG/200ML IV SOLN: 400.0000 mg | Freq: Three times a day (TID) | INTRAVENOUS | Status: DC

## 2015-10-01 NOTE — Progress Notes (Signed)
   10/01/15 1400  Clinical Encounter Type  Visited With Patient not available  Patient sleeping when [facility] attempted visit;

## 2015-10-01 NOTE — Consult Note (Signed)
Consultation Note Date: 10/01/2015   Patient Name: Kristen Arnold  DOB: [birth year]  MRN: [id number]  Age / Sex: 70 y.o., female  PCP: Hillary Bow, MD Referring Physician: Kalman Shan, MD  Reason for Consultation: Establishing goals of care, Ethics and Family-Clinician negotiation  Clinical Assessment/Narrative: 70 yo woman admitted from [facility] with complications following hemicolectomy at an outside facility requiring tracheostomy PEG and prolonged mechanical ventilation with multiple infections and failure to wean. She was admitted at [facility] following an aspiration event at [facility] and recurrence of SBO from adhesions. Additionally she has a stage IV decubitus ulcer.  Complicating Kristen Arnold's illness is clearly significant psychosocial issues from her life before she became ill and also lack of a reliable or willing surrogate decision maker. She has a son and a brother listed as contacts and they are from the [place], [place] area. The son is extremely difficult to reach by phone and has not been able to have a conversation with any of his mothers providers. We also do not know Kristen Arnold's underlying capacity or ability to make her own decisions for her care- she has been in and out of multiple facilities and no clear overarching goals have been established for her care and level of medical interventions.   Contacts/Participants in Discussion: unable to have discussion Primary Decision Maker: Kristen Arnold presumed son   HCPOA: no  Family difficult to reach, few details are known about the psycho-social dynamics affecting her care due to lack of surrogate decision maker.  SUMMARY OF RECOMMENDATIONS  Code Status/Advance Care Planning: Full code    Code Status Orders        Start     Ordered   09/26/15 1316  Full code   Continuous     09/26/15 1315    Code Status History    Date Active Date  Inactive Code Status Order ID Comments User Context   This patient has a current code status but no historical code status.      Other Directives:None  Symptom Management:  Pain PRNs only  Palliative Prophylaxis:   Aspiration, Bowel Regimen, Delirium Protocol, Eye Care, Frequent Pain Assessment, Oral Care, Palliative Wound Care and Turn Reposition  Additional Recommendations (Limitations, Scope, Preferences):  Full Scope Treatment  Consider SLP consult for COGNITIVE EVALUATION: We need to see if patient herself can contribute to her own decision making.  Psycho-social/Spiritual:  Support System: Poor Desire for further Chaplaincy support:yes Additional Recommendations: none  Prognosis: Unable to determine  Discharge Planning: Lik[facility] back to [facility] or LTC   Chief Complaint/ Primary Diagnoses: Present on Admission:  . Small bowel obstruction (HCC) . Aspiration pneumonia (HCC)  I have reviewed the medical record, interviewed the patient and family, and examined the patient. The following aspects are pertinent.  Past Medical History  Diagnosis Date  . Bowel obstruction (HCC)     colectomy 06/2015  . Anemia   . COPD (chronic obstructive pulmonary disease) (HCC)   . Dementia   . Acute on chronic respiratory failure (HCC)   . Anxiety   . Depression   . Diabetes mellitus without complication (HCC)   . DVT (deep venous thrombosis) (HCC)   . Recurrent Clostridium difficile diarrhea   . History of Pseudomonas pneumonia   . Severe protein-calorie malnutrition (HCC)   . Chronic pain    Social History   Social History  . Marital Status: Unknown    Spouse Name: N/A  . Number of Children: N/A  . Years of  Education: N/A   Social History Main Topics  . Smoking status: Former Smoker    Types: Cigarettes  . Smokeless tobacco: Never Used  . Alcohol Use: No  . Drug Use: No  . Sexual Activity: No   Other Topics Concern  . None   Social History Narrative   History  reviewed. No pertinent family history. Scheduled Meds: . antiseptic oral rinse  7 mL Mouth Rinse QID  . chlorhexidine gluconate  15 mL Mouth Rinse BID  . Chlorhexidine Gluconate Cloth  6 each Topical Q0600  . ciprofloxacin  400 mg Intravenous Q8H  . digoxin  0.075 mg Intravenous Daily  . fluconazole (DIFLUCAN) IV  100 mg Intravenous Q24H  . ipratropium-albuterol  3 mL Nebulization Q6H  . mupirocin ointment  1 application Nasal BID  . pantoprazole (PROTONIX) IV  40 mg Intravenous Q24H  . vancomycin  500 mg Oral 4 times per day  . vancomycin  1,000 mg Intravenous Q24H   Continuous Infusions: . dextrose 5 % and 0.45% NaCl 75 mL/hr at 09/30/15 1800   PRN Meds:.sodium chloride, fentaNYL (SUBLIMAZE) injection, hydrALAZINE, metoprolol Medications Prior to Admission:  Prior to Admission medications   Medication Sig Start Date End Date Taking? Authorizing Provider  acetaminophen (TYLENOL) 325 MG tablet 650 mg by PEG Tube route every 4 (four) hours as needed for moderate pain.   Yes Historical Provider, MD  acidophilus (RISAQUAD) CAPS capsule 1 capsule by PEG Tube route 3 (three) times daily.   Yes Historical Provider, MD  bisacodyl (DULCOLAX) 10 MG suppository Place 10 mg rectally every 3 (three) days as needed for moderate constipation.   Yes Historical Provider, MD  chlorhexidine (PERIDEX) 0.12 % solution Use as directed 15 mLs in the mouth or throat 2 (two) times daily.   Yes Historical Provider, MD  clonazePAM (KLONOPIN) 0.5 MG tablet 0.5 mg by PEG Tube route every 8 (eight) hours as needed for anxiety.   Yes Historical Provider, MD  digoxin (LANOXIN) 0.125 MG tablet 0.125 mg by PEG Tube route daily.   Yes Historical Provider, MD  diltiazem (CARDIZEM) 60 MG tablet 60 mg by PEG Tube route every 6 (six) hours.   Yes Historical Provider, MD  famotidine (PEPCID) 20 MG tablet 20 mg by PEG Tube route daily.   Yes Historical Provider, MD  fentaNYL (DURAGESIC - DOSED MCG/HR) 100 MCG/HR Place 100 mcg  onto the skin every 3 (three) days.   Yes Historical Provider, MD  fentaNYL (DURAGESIC - DOSED MCG/HR) 25 MCG/HR patch Place 25 mcg onto the skin every 3 (three) days.   Yes Historical Provider, MD  glucagon (GLUCAGON EMERGENCY) 1 MG injection Inject 1 mg into the vein once as needed (for hypoglycemia).   Yes Historical Provider, MD  ibuprofen (ADVIL,MOTRIN) 600 MG tablet 600 mg by PEG Tube route every 6 (six) hours as needed for fever or moderate pain.   Yes Historical Provider, MD  insulin aspart (NOVOLOG) 100 UNIT/ML injection Inject 2-10 Units into the skin 3 (three) times daily as needed for high blood sugar. Per sliding scale: 151-200= 2 units; 201-250= 4 units; 251-300= 6 units; 301-350= 8 units; 351-400= 10 units, if above 401 call MD   Yes Historical Provider, MD  ipratropium-albuterol (DUONEB) 0.5-2.5 (3) MG/3ML SOLN Take 3 mLs by nebulization every 6 (six) hours.   Yes Historical Provider, MD  Lactulose 20 GM/30ML SOLN 30 mLs by PEG Tube route daily as needed (constipation).   Yes Historical Provider, MD  magnesium oxide (MAG-OX) 400  MG tablet 400 mg by PEG Tube route 3 (three) times daily.   Yes Historical Provider, MD  metoCLOPramide (REGLAN) 5 MG tablet 5 mg by PEG Tube route every 6 (six) hours.   Yes Historical Provider, MD  metoprolol tartrate (LOPRESSOR) 25 MG tablet 25 mg by PEG Tube route every 6 (six) hours. For tachycardia & hypertension, hold of SBP < 100, hold if HR less than 55   Yes Historical Provider, MD  pantoprazole (PROTONIX) 40 MG tablet 40 mg by PEG Tube route 2 (two) times daily.   Yes Historical Provider, MD  QUEtiapine (SEROQUEL) 100 MG tablet 150 mg by PEG Tube route 2 (two) times daily.   Yes Historical Provider, MD  tobramycin (NEBCIN) 10 MG/ML SOLN injection Inject 440 mg into the vein every 36 (thirty-six) hours. For respiratory infection until 10/02/15   Yes Historical Provider, MD  tuberculin 5 UNIT/0.1ML injection Inject 5 Units into the skin every 3 (three)  days. For routine diagnostic until 10/10/15   Yes Historical Provider, MD  vancomycin (VANCOCIN) 125 MG capsule 125 mg by PEG Tube route every 6 (six) hours. For C-Diff   Yes Historical Provider, MD  vitamin C (ASCORBIC ACID) 500 MG tablet 500 mg by PEG Tube route daily.   Yes Historical Provider, MD   Allergies  Allergen Reactions  . Codeine     Unknown reaction, listed on MAR    Review of Systems  Physical Exam General: Chronically ill appearing, on vent. Very frail and cachetic Neuro: Awake. Nods. Seems to understand simple questions, generalized weakness HEENT: NCAT, Trach site clean, dry Cardiovascular: RRR, no mgr Lungs: Vent supported breaths, normal air entry, occ rhonchi  Abdomen: Now non-tender to palpation (some guarding), BS+, PEG in place, R surgical scar axillary line well healed Musculoskeletal: Diminished bulk, tone Skin: Stage IV sacral decub Vital Signs: BP 144/68 mmHg  Pulse 86  Temp(Src) 97.4 F (36.3 C) (Oral)  Resp 24  Ht  (1.549 m)  Wt 46.8 kg (103 lb 2.8 oz)  BMI 19.50 kg/m2  SpO2 100%  SpO2: SpO2: 100 % O2 Device:SpO2: 100 % O2 Flow Rate: .   IO: Intake/output summary:  Intake/Output Summary (Last 24 hours) at 10/01/15 1610 Last data filed at 10/01/15 0700  Gross per 24 hour  Intake 3006.1 ml  Output   1705 ml  Net 1301.1 ml    LBM: Last BM Date:  (rectal tube) Baseline Weight: Weight: 45.813 kg (101 lb) Most recent weight: Weight: 46.8 kg (103 lb 2.8 oz)      Palliative Assessment/Data:  Flowsheet Rows        Most Recent Value   Intake Tab    Referral Department  Critical care   Unit at Time of Referral  ICU   Palliative Care Primary Diagnosis  Other (Comment) [SBO]   Date Notified  09/27/15   Palliative Care Type  New Palliative care   Reason for referral  Clarify Goals of Care   Date of Admission  09/26/15   Date first seen by Palliative Care  09/29/15   # of days Palliative referral response time  2 Day(s)   # of  days IP prior to Palliative referral  1   Clinical Assessment    Palliative Performance Scale Score  20%   Pain Max last 24 hours  Not able to report   Pain Min Last 24 hours  Not able to report   Dyspnea Max Last 24 Hours  Not able to  report   Dyspnea Min Last 24 hours  Not able to report   Nausea Max Last 24 Hours  Not able to report   Nausea Min Last 24 Hours  Not able to report   Anxiety Max Last 24 Hours  Not able to report   Anxiety Min Last 24 Hours  Not able to report   Other Max Last 24 Hours  Not able to report   Psychosocial & Spiritual Assessment    Palliative Care Outcomes       Additional Data Reviewed:  CBC:    Component Value Date/Time   WBC 10.2 10/01/2015 0600   HGB 8.6* 10/01/2015 0600   HCT 28.0* 10/01/2015 0600   PLT 367 10/01/2015 0600   MCV 84.6 10/01/2015 0600   NEUTROABS 7.1 10/01/2015 0600   LYMPHSABS 1.5 10/01/2015 0600   MONOABS 0.9 10/01/2015 0600   EOSABS 0.8* 10/01/2015 0600   BASOSABS 0.0 10/01/2015 0600   Comprehensive Metabolic Panel:    Component Value Date/Time   NA 136 10/01/2015 0600   K 3.4* 10/01/2015 0600   CL 105 10/01/2015 0600   CO2 24 10/01/2015 0600   BUN 10 10/01/2015 0600   CREATININE 0.59 10/01/2015 0600   GLUCOSE 178* 10/01/2015 0600   CALCIUM 8.3* 10/01/2015 0600   AST 30 09/26/2015 1030   ALT 17 09/26/2015 1030   ALKPHOS 128* 09/26/2015 1030   BILITOT 0.5 09/26/2015 1030   PROT 6.8 09/26/2015 1030   ALBUMIN 3.2* 09/26/2015 1030     Time In: 9AM Time Out: 9:35AM Time Total: 35 min Greater than 50%  of this time was spent counseling and coordinating care related to the above assessment and plan.  Signed by: Hilbert Odor, DO  10/01/2015, 8:08 AM  Please contact Palliative Medicine Team phone at [phone] for questions and concerns.

## 2015-10-01 NOTE — Progress Notes (Signed)
PULMONARY / CRITICAL CARE MEDICINE   Name: Kristen Arnold MRN: [id number] DOB: August 05, 1945    ADMISSION DATE:  09/26/2015 CONSULTATION DATE:  09/26/2015  REFERRING MD:  Patria Mane, EDP  CHIEF COMPLAINT:  Sent from facility for aspiration and concern for bowel obstruction  BRIEF 70 year old female with COPD and narcotic dependence. In December 2016 she was admitted from an emergency department with findings of a bowel obstruction, renal failure, and lactic acidosis. She underwent a right hemicolectomy in the setting of bowel obstruction which was felt to be related to adhesions from a prior hernia repair. Postoperatively she was unable to be weaned from a ventilator so ultimately a tracheostomy was performed. She's had several infectious complications including an enterococcus UTI, MRSA pneumonia, Pseudomonas pneumonia, and Pseudomonas related wound infection, and C. difficile. She was transferred to [facility] this week in the setting of prolonged mechanical ventilation. [facility] was [facility]/2015 after above prolonged hospitalization.   She was then brought to the [facility] emergency department on 09/26/2015 asked her she was found to have presumed aspiration pneumonia in the setting of a possible small bowel obstruction. On the morning of 09/26/2015 she was found to have significant brown secretions from her mouth and it was felt that she had aspiration pneumonia. A chest x-ray showed findings worrisome for pneumonia. A KUB apparently showed findings concerning for a small bowel obstruction. In the [facility] emergency department she was noted to have brown, coffee ground bili is secretions from her G-tube. Of note, she was found to have erosive esophagitis in one of the health care facilities that cared for her prior to [facility].She was initially hypotensive in the [facility] emergency department but she has responded to IV fluids. Pulmonary and  critical care medicine was consulted for admission.  LINES/TUBES: PICC left arm (date?) Trach (date?) PEG (date?)    CULTURES: 2/24 Blood cultures> pseudomona & coag neg SA R -to cephalosporins . S - cipro and imipenem February 24 urine cultures: >100K yeast  February 24 respiratory culture:Pseudomonas:  2/24 - MRSA PCR - POSITIVE cdiff PCR 2/27>>>neg   ANTIBIOTICS: 2/24 Oral vanc >  2/24 Vanc IV >  2/24 Zosyn IV > 2/27 2/27 Cipro >>> 2/27 diflucan>>>  SIGNIFICANT EVENTS & STUDIES 2/24 admission 09/26/2015 CT abdomen and pelvis  09/27/15 - stage 4 5cm sacral decub noticed at admit. Nutrition dx severe malnutrtion at admit.  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 RN could not get NG. CCS recommending IR guided NG.  GNR in aerobic bottles. RASS -4 at admit and now -3 (this is off sedation)./ Not on pressors.  2/28 failed swallow eval; cognitive eval suggests unable to make decisions    SUBJECTIVE/OVERNIGHT/INTERVAL HX Stable overnight. No acute issues.   VITAL SIGNS: BP 144/68 mmHg  Pulse 86  Temp(Src) 97.6 F (36.4 C) (Oral)  Resp 24  Ht  (1.549 m)  Wt 103 lb 2.8 oz (46.8 kg)  BMI 19.50 kg/m2  SpO2 100%  HEMODYNAMICS:    VENTILATOR SETTINGS: Vent Mode:  [-] PRVC FiO2 (%):  [40 %] 40 % Set Rate:  [20 bmp] 20 bmp Vt Set:  [450 mL] 450 mL PEEP:  [8 cmH20] 8 cmH20 Plateau Pressure:  [17 cmH20-22 cmH20] 19 cmH20  INTAKE / OUTPUT: I/O last 3 completed shifts: In: 5004.6 [I.V.:2984.6; Other:170; IV Piggyback:1850] Out: 2615 [Urine:1840; Drains:100; Stool:675]  PHYSICAL EXAMINATION: General:  Chronically ill appearing, on vent. Very frail and  cachetic Neuro:  Awake.  Seems to understand simple questions, generalized weakness. Mouths words HEENT:  NCAT, Trach site clean, dry Cardiovascular:  RRR, no mgr Lungs:  Vent supported breaths, normal air entry, occ rhonchi  Abdomen:  Now non-tender to palpation (some guarding), BS+, PEG in place, R  surgical scar axillary line well healed. Rectal tube draining  Musculoskeletal:  Diminished bulk, tone Skin:  Stage IV sacral decub  LABS: PULMONARY  Recent Labs Lab 09/26/15 1357  PHART 7.482*  PCO2ART 40.4  PO2ART 64.4*  HCO3 29.9*  TCO2 27.5  O2SAT 90.2    CBC  Recent Labs Lab 09/29/15 0451 09/30/15 0300 10/01/15 0600  HGB 7.9* 8.1* 8.6*  HCT 26.0* 27.3* 28.0*  WBC 12.2* 11.3* 10.2  PLT 314 326 367    COAGULATION  Recent Labs Lab 09/26/15 1030  INR 1.20    CARDIAC    Recent Labs Lab 09/26/15 1030 09/28/15 0436  TROPONINI 0.06* 0.11*   No results for input(s): PROBNP in the last 168 hours.   CHEMISTRY  Recent Labs Lab 09/28/15 0436 09/29/15 0451 09/29/15 2100 09/30/15 0300 09/30/15 2000 10/01/15 0600  NA  --  140 139 138 134* 136  K  --  2.1* 3.2* 2.7* 4.0 3.4*  CL  --  104 105 104 103 105  CO2  --  28 26 24 24 24   GLUCOSE  --  190* 187* 160* 236* 178*  BUN  --  18 14 14 10 10   CREATININE  --  0.73 0.69 0.61 0.68 0.59  CALCIUM  --  8.3* 8.1* 8.1* 8.0* 8.3*  MG 1.6* 2.0  --  1.5* 2.4 2.1  PHOS 2.0* 2.2*  --  2.4*  --  2.9   Estimated Creatinine Clearance: 48.3 mL/min (by C-G formula based on Cr of 0.59).   LIVER  Recent Labs Lab 09/26/15 1030  AST 30  ALT 17  ALKPHOS 128*  BILITOT 0.5  PROT 6.8  ALBUMIN 3.2*  INR 1.20     INFECTIOUS  Recent Labs Lab 09/26/15 1049 09/26/15 1440 09/28/15 0432  LATICACIDVEN 3.92* 2.1* 1.0     ENDOCRINE CBG (last 3)   Recent Labs  09/28/15 1542  GLUCAP 196*    IMAGING x48h  - image(s) personally visualized  -   highlighted in bold No results found.  DISCUSSION: 70 y/o female with COPD, and a recent prolonged hospitalization from a large bowel obstruction leading to prolonged mechanical ventilator support and multiple infectious complications was admitted on 2/24 from [facility] with nausea/vomiting and likely aspiration. Clinically stable. Now cleared to use her gut. Cipro for  pseudomonas PNA and bacteremia. Vanc for coag neg SA, Oral vanc as well given cdiff hx and want to avoid reoccurance. Will repeat blood cultures today and ask IR to place J tube. Hope back to [facility] soon.   ASSESSMENT / PLAN:  PULMONARY A: #Baseline  - COPD NOS  #Current Acute on Chronic Resp Failure due to COPD and chronic critical illness S/p tracheostomy   - unable to wean - this is chronic per records-->she has difficulty tolerating anything more than lying in bed.  P:   Full vent support with setting from [facility] Trach care CXR PRN VAP prevention  CARDIOVASCULAR A:  Sinus tachycardia, no shock-->resolved Lactic acidosis Sepsis  -  Lactate normalized. Not on pressors  P:  Tele monitoring Continue IVF -  D5 1/2  RENAL A:   AKI? No baseline labs available Severe hypokalemia  CT at admit  with left hydro   - AKi improving. 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 [place] urine P:   Monitor BMET and UOP Aggressive KCL replacement  Replace electrolytes as needed   GASTROINTESTINAL A:   #baseline Recent bowel obstruction dec 2016  #current - concern for UGI bleed at admit - doubt based on clinical profile at followup 09/27/15. Known erosive esophagitis - confirmed SBO at admit 09/26/2015 . RN unable to get NG tube-->clinically cleared to use gut as of 2/28  P:   PPI gtt -> changed to daily NPO PEG care Will ask IR to place  J tube over G tube should supplemental Diet be indicated.   HEMATOLOGIC A:   Leukocytosis Anemia P:  PRBC for hgb < 7gm% Monitor CBC in setting of possible bleed SCD for DVT prevention  INFECTIOUS A:   #Baseline - dec 2016 to feb 2017 at outside hospital Stage IV sacral decub ulcer on admission C diff present on admission Recent MRSA pneumonia Recent Pseudomonas pneumonia Recent enterococcus UTI REcent pseudomonas wound infection (sacral) Recent C diff Yeast UTI   #current  - MRSA PCR +  - Psedudonmonas bacteremia and pneumonia -  C diff - per RN hx but PCR was negative  P:   Spoke w/ ID; will change to Cipro for the Pseudomonas as sensitive in both the blood and sputum Will just go ahead and cont oral vanc (dc enemas)as c diff prophylaxis on the chance that she did indeed have it previously.  WOC culture Diflucan for UTI Will repeat blood cultures on 3/1  ENDOCRINE A:   No acute issues P:   Monitor glucose  NEUROLOGIC A:   Acute encephalopathy> presumably due to infection Chronic narcotic dependence ? Element of dementia   - RASS +1 equivalent 09/28/15 without meds and this is improved per RN P:   DC'd all  Opioids and benzo including PRN RASS goal: 0 Will ask SLP to also perform cognitive testing   DERM A Stage 4 decub at admit 09/26/2015 P Wound care consulted   FAMILY  - Updates: none bedside 09/26/15 aand 09/27/15 and 09/28/15. Will need on-going family discussion re: goals of care   - Inter-disciplinary family meet or Palliative Care meeting due by:  day 7 - 10/02/15. - Her prognosis is very poor. One year mortality for someone with copd in LTAC with multiple infections and now with SBO recurrent is nearly 100%. Will call palliative care   Simonne Martinet ACNP-BC [facility] Pulmonary/Critical Care Pager # [phone] OR # [phone] if no answer    10/01/2015 8:56 AM

## 2015-10-01 NOTE — Progress Notes (Signed)
[facility] Progress Note     Subjective: Pt still hard to understand, but alert and answers yes/no questions well.  Mild nausea, no vomiting or pain.  Mildly SOB.  Her bed is wet, likely urinating around foley.  Respiratory notes increased mucous plugging.  Objective: Vital signs in last 24 hours: Temp:  [96.6 F (35.9 C)-97.8 F (36.6 C)] 97.4 F (36.3 C) (03/01 0400) Pulse Rate:  [80-103] 87 (03/01 0700) Resp:  [16-30] 22 (03/01 0700) BP: (132-182)/(55-99) 155/69 mmHg (03/01 0700) SpO2:  [95 %-100 %] 99 % (03/01 0700) FiO2 (%):  [40 %] 40 % (03/01 0403) Last BM Date:  (rectal tube)  Intake/Output from previous day: 02/28 0701 - 03/01 0700 In: 3174.8 [I.V.:1944.8; IV Piggyback:1200] Out: 1465 [Urine:1415; Drains:50] Intake/Output this shift:    PE: Gen:  Alert, NAD, pleasant Card:  RRR, no M/G/R heard Pulm:  On vent, no audible W/R/R Abd: Soft, NT/ND, +BS, no HSM, incision well healed, PEG clamped, liquid green stool in rectal tube bag Ext:  No erythema, edema, or tenderness  Lab Results:   Recent Labs  09/30/15 0300 10/01/15 0600  WBC 11.3* 10.2  HGB 8.1* 8.6*  HCT 27.3* 28.0*  PLT 326 367   BMET  Recent Labs  09/30/15 2000 10/01/15 0600  NA 134* 136  K 4.0 3.4*  CL 103 105  CO2 24 24  GLUCOSE 236* 178*  BUN 10 10  CREATININE 0.68 0.59  CALCIUM 8.0* 8.3*   PT/INR No results for input(s): LABPROT, INR in the last 72 hours. CMP     Component Value Date/Time   NA 136 10/01/2015 0600   K 3.4* 10/01/2015 0600   CL 105 10/01/2015 0600   CO2 24 10/01/2015 0600   GLUCOSE 178* 10/01/2015 0600   BUN 10 10/01/2015 0600   CREATININE 0.59 10/01/2015 0600   CALCIUM 8.3* 10/01/2015 0600   PROT 6.8 09/26/2015 1030   ALBUMIN 3.2* 09/26/2015 1030   AST 30 09/26/2015 1030   ALT 17 09/26/2015 1030   ALKPHOS 128* 09/26/2015 1030   BILITOT 0.5 09/26/2015 1030   GFRNONAA >60 10/01/2015 0600   GFRAA >60 10/01/2015 0600   Lipase  No results  found for: LIPASE     Studies/Results: No results found.  Anti-infectives: Anti-infectives    Start     Dose/Rate Route Frequency Ordered Stop   09/30/15 1200  vancomycin (VANCOCIN) 50 mg/mL oral solution 500 mg     500 mg Oral 4 times per day 09/30/15 0958     09/29/15 2200  vancomycin (VANCOCIN) IVPB 1000 mg/200 mL premix     1,000 mg 200 mL/hr over 60 Minutes Intravenous Every 24 hours 09/29/15 1359     09/29/15 2000  ciprofloxacin (CIPRO) IVPB 400 mg     400 mg 200 mL/hr over 60 Minutes Intravenous Every 8 hours 09/29/15 1040     09/29/15 1200  fluconazole (DIFLUCAN) IVPB 100 mg     100 mg 50 mL/hr over 60 Minutes Intravenous Every 24 hours 09/29/15 1046     09/29/15 1045  ciprofloxacin (CIPRO) IVPB 400 mg     400 mg 200 mL/hr over 60 Minutes Intravenous NOW 09/29/15 1040 09/29/15 1218   09/28/15 1400  imipenem-cilastatin (PRIMAXIN) 250 mg in sodium chloride 0.9 % 100 mL IVPB  Status:  Discontinued     250 mg 200 mL/hr over 30 Minutes Intravenous 3 times per day 09/28/15 1202 09/29/15 1036   09/27/15 1200  vancomycin (VANCOCIN) 500 mg in  sodium chloride 0.9 % 100 mL IVPB  Status:  Discontinued     500 mg 100 mL/hr over 60 Minutes Intravenous Every 24 hours 09/26/15 1126 09/29/15 1357   09/26/15 1800  piperacillin-tazobactam (ZOSYN) IVPB 3.375 g  Status:  Discontinued     3.375 g 12.5 mL/hr over 240 Minutes Intravenous Every 8 hours 09/26/15 1126 09/28/15 1157   09/26/15 1800  vancomycin (VANCOCIN) 500 mg in sodium chloride irrigation 0.9 % 100 mL ENEMA  Status:  Discontinued     500 mg Rectal 4 times per day 09/26/15 1654 09/30/15 0958   09/26/15 1400  vancomycin (VANCOCIN) 50 mg/mL oral solution 500 mg  Status:  Discontinued     500 mg Oral 4 times per day 09/26/15 1315 09/26/15 1729   09/26/15 1130  vancomycin (VANCOCIN) IVPB 1000 mg/200 mL premix     1,000 mg 200 mL/hr over 60 Minutes Intravenous  Once 09/26/15 1118 09/26/15 1304   09/26/15 1115   piperacillin-tazobactam (ZOSYN) IVPB 3.375 g     3.375 g 100 mL/hr over 30 Minutes Intravenous  Once 09/26/15 1104 09/26/15 1234       Assessment/Plan HD #6 Ileus s/p recent colon surgery from adhesive SBO, also has PEG -Admitted 09/26/15, Surgery in November 2016 for SBO, reportedly had right hemicolectomy for SBO/adhesions from prior hernia repair, then had Trach/PEG placed in December 2016 -Transferred from outside hospital [facility] [place]) to [facility] on 09/22/15 -Clinically does not have a SBO or ileus at this point, does not need surgery from our perspective -PEG clamped. -SLP recommended NPO, may need IR to place J tube through PEG for more distal tube feeding to avoid aspiration.  It has been difficult due to no family being involved to make this decision and [place] not being able to adequately have a discussion about potential procedures.  Palliative following.    Protein calorie malnutrition Aspiration pneumonia with chronic respiratory failure with trach Sacral decubitus ulcer/trach pressure ulcer - WOC consult C.diff Leukocytosis - improved to 10.2 Hypokalemia - needs supplemented per primay ID - Cipro for pseudomonas, diflucan for UTI, oral and IV vanc as well for pneumonia and c.diff Disp - back to [facility] once stable?    LOS: 5 days    Nonie Hoyer 10/01/2015, 7:31 AM Pager: [phone]  (7am - 4:30pm M-F; 7am - 11:30am Sa/Su)

## 2015-10-01 NOTE — Progress Notes (Signed)
Patient transported to IR on Full vent support and 100% FiO2. No complications noted during procedure or during transport. Patient returned to room and returned to original FiO2. Patient is comfortable, vitals stable, and no distress noted. RT will continue to monitor patient.

## 2015-10-01 NOTE — Consult Note (Signed)
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  Noted also is undermining from 12-6 o'clock measuring 4cm. Also consulted for a full thickness medical device pressure injury caused by the trach collar. This is cleaned, dried and a padded dressing applied to protect. Area measures 0.4cm round x 0.2cm deep. Light yellow exudate, small amount. Measurement:per Friday PLUS UNDERMINING Wound UJW:JXBJ pink, moist Drainage (amount, consistency, odor) moderate serous Periwound:intact Dressing procedure/placement/frequency: We will change the trach dressing twice daily and PRN. Wound care to sacrum will continue daily and PRN. Patient is to transfer to [facility] today. WOC nursing team will not follow, but will remain available to this patient, the nursing and medical teams.  Please re-consult if needed. Thanks, Ladona Mow, MSN, RN, GNP, [place]  Pager# [phone]

## 2015-10-01 NOTE — Progress Notes (Signed)
Brief Nutrition Note  Consult received for enteral/tube feeding initiation and management.  Adult Enteral Nutrition Protocol initiated by MD. TF order adjusted to reflect recommendations provided earlier today in follow-up. Vital 1.2 @ 55mL/hr increase by 10 every 12 hours to goal rate of 69mL/hr.  RD to follow-up 3/2.  Tilda Franco, MS, RD, LDN Pager: [phone] After Hours Pager: [phone]

## 2015-10-01 NOTE — Procedures (Signed)
Convert G-tube to G/J-tube with placement of coaxial J to distal duod under fluoro No complication No blood loss. See complete dictation in [facility].

## 2015-10-01 NOTE — Progress Notes (Signed)
Date:  October 01, 2015 Chart reviewed for concurrent status and case management needs. Will continue to follow patient for changes and needs:  Remains on the vent full support Marcelle Smiling, BSN, Charity fundraiser, [place]   [phone]

## 2015-10-01 NOTE — Discharge Summary (Signed)
Physician Discharge Summary       Patient ID: Kristen Arnold MRN: [id number] DOB/AGE: 1946-03-23 70 y.o.  Admit date: 09/26/2015 Discharge date: 10/01/2015  Discharge Diagnoses:   Acute on Chronic Resp Failure Tracheostomy dependence  COPD Inability to wean Lactic acidosis Severe sepsis  Hypokalemia  AKI Small Bowel Obstruction  erosive esophagitis Leukocytosis Anemia h/o MRSA pneumonia h/o enterococcus UTI h/o pseudomonas wound infection (sacral) Recent C diff Yeast UTI  Coag negative staph bacteremia  Psedudonmonas bacteremia Pseudomonas pneumonia Recent C diff infection Acute encephalopathy Chronic narcotic dependence Probable  dementia  Severe protein calorie malnutrition Severe physical deconditioning  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 In December 2016 she was admitted from an emergency department with findings of a bowel obstruction, renal failure, and lactic acidosis. She underwent a right hemicolectomy in the setting of bowel obstruction which was felt to be related to adhesions from a prior hernia repair. Postoperatively she was unable to be weaned from a ventilator so ultimately a tracheostomy was performed. She's had several infectious complications including an enterococcus UTI, MRSA pneumonia, Pseudomonas pneumonia, and Pseudomonas related wound infection, and C. difficile. [facility]/2015 after above prolonged hospitalization.   She was then brought to the [facility] emergency department on 09/26/2015 after she was found to have presumed aspiration pneumonia in the setting of a possible small bowel obstruction. On the morning of 09/26/2015 she was found to have significant brown secretions from her mouth and it was felt that she had aspiration pneumonia. A chest x-ray showed findings worrisome for pneumonia. A KUB  apparently showed findings concerning for a small bowel obstruction. In the [facility] emergency department she was noted to have brown, coffee ground bilious secretions from her G-tube. Of note, she was found to have erosive esophagitis in one of the health care facilities that cared for her prior to [facility]. She was initially hypotensive in the [facility] emergency department but she has responded to IV fluids. Pan-cultures were obtained & empiric antibiotics (vanc and zosyn) were initiated. Pulmonary and critical care medicine was consulted for admission.  She was treated conservatively (IV hydration & bowel rest) for the SBO which responded to this. We did attempt swallow eval at the bedside to see if she could have oral diet but she failed this w/ evidence of significant dysphagia at this time. Based on the failed swallow eval per the recommendations of General surgery we had the existing G-tube converted to a J tube by interventional radiology. In regards to her infections her blood Cultures from 2/24 grew out: Coag negative staph AND Pseudomonas Aeruginosa. Her sputum cultures also showed Pseudomonas. The sputum culture was resistant to imipenem as well as zosyn, the blood cultures were sensitive to imipenem. The pseudomonas was sensitive to Cipro on both cultures. We spoke to ID given history of C diff and we were concerned for relapse. They recommended treating prophylactically with oral vanc and carrying it out another week after completion. In regards to the Coag negative Staph she is currently on IV vanc and repeat blood cultures were drawn as of 3/1.   At time of discharge she is now hemodynamically stable. She is awake, alert, conversant but confused at times. She moves all extremities but is profoundly deconditioned and even getting out of bed on full vent support causes significant fatigue. She had been deemed unable to  wean in the past and unfortunately we are doubtful this has changed given her  degree of malnutrition and deconditioning. At this point she is stable to return to the LTAC setting at [facility] with the plan of care as outlined below.     Discharge Plan by active problems  1) Chronic Resp Failure w/ failure to wean due to COPD, severe deconditioning, malnutrition  and chronic critical illness 2) Tracheostomystatus  Plan  Full vent support with setting from [facility] Current vent settings: Vent Mode:  [-] PRVC FiO2 (%):  [40 %] 40 % Set Rate:  [20 bmp] 20 bmp Vt Set:  [450 mL] 450 mL PEEP:  [5 cmH20-8 cmH20] 5 cmH20 Plateau Pressure:  [14 cmH20-22 cmH20] 14 cmH20 Trach care CXR PRN VAP prevention Ok to cont weaning efforts while in LTAC  3) Resolved AKI 4) Chronic Left hydronephrosis  5) Chronic foley cath (changed last 2/27) Plan Avoid hypotension Shoot for even volume status Change foley q 30d  6) Severe protein calorie malnutrition  7) Recurrent SBO-->treated w/ bowel rest  8) Known erosive esophagitis 9) Dysphagia  Now s/p conversion from G tube to J tube on 3/1 by interventional radiology  Plan Cont PPI Will need nutritional assessment  Cleared to resume tube feeds per nutritional recs  10) Anemia Plan:  PRBC for hgb < 7gm% Monitor CBC in setting of possible bleed SCD for DVT prevention  11 & 12) Psedudonmonas bacteremia and pneumonia Plan Complete 2 weeks Cipro (started 2/27)  13) Coag negative staph bacteremia  Plan Repeat Blood cultures obtained 3/1 vanc started 2/24, now day #5 therapy. If blood cultures from [facility] negative can be stopped on day 8  14) Candida UTI Foley changed 2/27.  Plan Diflucan x 14 days (started 2/27)  15) Recent Cdiff infection Plan Cont prophylactic oral vanc thru and then 1 week after completion of Cipro for above PNA and bacteremia   16) Acute encephalopathy> presumably due to infection 17) Chronic narcotic dependence 18) Probable Element of dementia  Cognitive eval carried out on 2/28:   Pt presents with cogntiive linguistic deficits impacting areas of attention, problem solving, disorientation and ability to follow beyond 1 step directions without visual cues. Basic Y/N questions answered 80% accurately and pt was oriented to being in the hosptial with verbal choice of 2 (hospital/ball game) .She did not identify proper year with verbal choice of 2 - saying yes to both years (2001 and 2017). She did correctly identify her birthday with verbal choice of 2 but not written. Pt also did not recall prior surgery nor trach/PEG placement.Pt also benefited from cues to functional tasks including using stronger right hand than left to try to point to large print words.       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 S - cipro and imipenem February 24 urine cultures: >100K yeast  February 24 respiratory culture:Pseudomonas:  2/24 - MRSA PCR - POSITIVE cdiff PCR 2/27>>>neg   ANTIBIOTICS: 2/24 Oral vanc >  2/24 Vanc IV >  2/24 Zosyn IV > 2/27 2/27 Cipro >>> 2/27 diflucan & foley cath change>>>  Discharge Exam: BP 135/35 mmHg  Pulse 79  Temp(Src) 97.6 F (36.4 C) (Oral)  Resp 20  Ht 5\' 1"  (1.549 m)  Wt 103 lb 2.8 oz (46.8 kg)  BMI 19.50 kg/m2  SpO2 100%  General: Chronically ill appearing, on vent. Very frail and cachetic Neuro: Awake. Seems to understand simple questions, generalized weakness. Mouths words HEENT: NCAT, Trach site clean, dry Cardiovascular: RRR, no mgr Lungs: Vent supported breaths, normal air entry, occ rhonchi  Abdomen: Now non-tender to palpation (some guarding), BS+, PEG in place, R surgical scar axillary line well healed. Rectal tube draining  Musculoskeletal: Diminished bulk,  tone Skin: Stage IV sacral decub  Labs at discharge Lab Results  Component Value Date   CREATININE 0.59 10/01/2015   BUN 10 10/01/2015   NA 136 10/01/2015   K 3.4* 10/01/2015   CL 105 10/01/2015   CO2 24 10/01/2015   Lab Results  Component Value Date   WBC 10.2 10/01/2015   HGB 8.6* 10/01/2015   HCT 28.0* 10/01/2015   MCV 84.6 10/01/2015   PLT 367 10/01/2015   Lab Results  Component Value Date   ALT 17 09/26/2015   AST 30 09/26/2015   ALKPHOS 128* 09/26/2015   BILITOT 0.5 09/26/2015   Lab Results  Component Value Date   INR 1.20 09/26/2015    Current radiology studies Ir Adele Schilder Tube Convert Gastr-jej Per W/fl Mod Sed  10/01/2015  INDICATION: Postop ileus after abdominal surgery for small bowel obstruction. Incomplete gastric decompression by a gastrostomy catheter placed at an outside institution. Post pyloric feeding is needed, and conversion to a dual-lumen gastrojejunostomy catheter requested EXAM: CONVERT G-TUBE TO G-JTUBE PROCEDURE: The gastrostomy tube and surrounding skin prepped and draped in usual sterile fashion. Maximal barrier sterile technique was utilized including caps, mask, sterile gowns, sterile gloves, sterile drape, hand hygiene and skin antiseptic. The catheter was cut short. A 9 French peel-away sheath and coaxial angled stiff Glidewire were advanced and used with the aid of an angled 5 French angiographic catheter to achieve access to the distal duodenum. Over the Glidewire, a coaxial jejunal catheter was advanced, positioned with its tip in the distal duodenum. Catheter injection confirmed appropriate catheter position and patency. The device was secured externally to the extant gastrostomy catheter. Both lumens were flushed and capped. FINDINGS: Percutaneous pull-through 20 French gastrostomy catheter is in appropriate position. Coaxial jejunal feeding tube was placed. IMPRESSION: 1. Conversion of gastrostomy catheter to coaxial gastrojejunostomy catheter.  Electronically Signed   By: Corlis Leak M.D.   On: 10/01/2015 14:13    Disposition:  Final discharge disposition not confirmed      Discharge Instructions    Diet - low sodium heart healthy    Complete by:  As directed      Increase activity slowly    Complete by:  As directed             Medication List    STOP taking these medications        diltiazem 60 MG tablet  Commonly known as:  CARDIZEM     DULCOLAX 10 MG suppository  Generic drug:  bisacodyl     famotidine 20 MG tablet  Commonly known as:  PEPCID     fentaNYL 100 MCG/HR  Commonly known as:  DURAGESIC - dosed mcg/hr     fentaNYL 25 MCG/HR patch  Commonly known as:  DURAGESIC - dosed mcg/hr     GLUCAGON EMERGENCY 1 MG injection  Generic drug:  glucagon     ibuprofen 600 MG tablet  Commonly known as:  ADVIL,MOTRIN     insulin  aspart 100 UNIT/ML injection  Commonly known as:  novoLOG     QUEtiapine 100 MG tablet  Commonly known as:  SEROQUEL     tobramycin 10 MG/ML Soln injection  Commonly known as:  NEBCIN     tuberculin 5 UNIT/0.1ML injection      TAKE these medications        acetaminophen 325 MG tablet  Commonly known as:  TYLENOL  650 mg by PEG Tube route every 4 (four) hours as needed for moderate pain.     acidophilus Caps capsule  1 capsule by PEG Tube route 3 (three) times daily.     chlorhexidine 0.12 % solution  Commonly known as:  PERIDEX  Use as directed 15 mLs in the mouth or throat 2 (two) times daily.     ciprofloxacin 400 MG/200ML Soln  Commonly known as:  CIPRO  Inject 200 mLs (400 mg total) into the vein every 8 (eight) hours.     clonazePAM 0.5 MG tablet  Commonly known as:  KLONOPIN  0.5 mg by PEG Tube route every 8 (eight) hours as needed for anxiety.     dextrose 5 % and 0.45% NaCl infusion  At 75 ml/hr     digoxin 0.125 MG tablet  Commonly known as:  LANOXIN  0.125 mg by PEG Tube route daily.     feeding supplement (VITAL AF 1.2 CAL) Liqd  Place 1,000 mLs  into feeding tube daily.     fluconazole 100-0.9 MG/50ML-% Soln IVPB  Commonly known as:  DIFLUCAN  Inject 50 mLs (100 mg total) into the vein daily.     ipratropium-albuterol 0.5-2.5 (3) MG/3ML Soln  Commonly known as:  DUONEB  Take 3 mLs by nebulization every 6 (six) hours.     Lactulose 20 GM/30ML Soln  30 mLs by PEG Tube route daily as needed (constipation).     magnesium oxide 400 MG tablet  Commonly known as:  MAG-OX  400 mg by PEG Tube route 3 (three) times daily.     metoCLOPramide 5 MG tablet  Commonly known as:  REGLAN  5 mg by PEG Tube route every 6 (six) hours.     metoprolol tartrate 25 MG tablet  Commonly known as:  LOPRESSOR  25 mg by PEG Tube route every 6 (six) hours. For tachycardia & hypertension, hold of SBP < 100, hold if HR less than 55     pantoprazole 40 MG tablet  Commonly known as:  PROTONIX  40 mg by PEG Tube route 2 (two) times daily.     vancomycin 1 GM/200ML Soln  Commonly known as:  VANCOCIN  Inject 200 mLs (1,000 mg total) into the vein daily.     vancomycin 50 mg/mL oral solution  Commonly known as:  VANCOCIN  Take 10 mLs (500 mg total) by mouth every 6 (six) hours.     vitamin C 500 MG tablet  Commonly known as:  ASCORBIC ACID  500 mg by PEG Tube route daily.         Discharged Condition: stable/chronically ill   Physician Statement:   The Patient was personally examined, the discharge assessment and plan has been personally reviewed and I agree with ACNP Zarianna Dicarlo's assessment and plan. > 30 minutes of time have been dedicated to discharge assessment, planning and discharge instructions.   Signed: Shelby Mattocks 10/01/2015, 2:25 PM

## 2015-10-02 NOTE — Progress Notes (Signed)
Pt d/c to [facility] on 10/01/15. RNCM assisted with d/c planning needs.  Cori Razor LCSW [phone]

## 2015-10-06 LAB — CULTURE, BLOOD (ROUTINE X 2)
CULTURE: NO GROWTH
Culture: NO GROWTH

## 2015-11-13 ENCOUNTER — Other Ambulatory Visit (HOSPITAL_COMMUNITY): Payer: Self-pay | Admitting: Internal Medicine

## 2015-11-13 DIAGNOSIS — R1310 Dysphagia, unspecified: Secondary | ICD-10-CM

## 2015-11-14 ENCOUNTER — Inpatient Hospital Stay (HOSPITAL_COMMUNITY): Admission: RE | Admit: 2015-11-14 | Payer: Medicare Other | Source: Ambulatory Visit

## 2015-12-04 ENCOUNTER — Emergency Department (HOSPITAL_COMMUNITY): Payer: Medicare Other

## 2015-12-04 ENCOUNTER — Inpatient Hospital Stay (HOSPITAL_COMMUNITY)
Admission: EM | Admit: 2015-12-04 | Discharge: 2015-12-18 | DRG: 870 | Disposition: A | Discharge status: Other Acute Inpatient Hospital | Payer: Medicare Other | Attending: Internal Medicine | Admitting: Internal Medicine

## 2015-12-04 ENCOUNTER — Encounter (HOSPITAL_COMMUNITY): Payer: Self-pay

## 2015-12-04 DIAGNOSIS — B961 Klebsiella pneumoniae [K. pneumoniae] as the cause of diseases classified elsewhere: Secondary | ICD-10-CM | POA: Diagnosis present

## 2015-12-04 DIAGNOSIS — Z681 Body mass index (BMI) 19 or less, adult: Secondary | ICD-10-CM | POA: Diagnosis not present

## 2015-12-04 DIAGNOSIS — J151 Pneumonia due to Pseudomonas: Secondary | ICD-10-CM | POA: Diagnosis not present

## 2015-12-04 DIAGNOSIS — Z87891 Personal history of nicotine dependence: Secondary | ICD-10-CM | POA: Diagnosis not present

## 2015-12-04 DIAGNOSIS — B952 Enterococcus as the cause of diseases classified elsewhere: Secondary | ICD-10-CM | POA: Diagnosis present

## 2015-12-04 DIAGNOSIS — A0472 Enterocolitis due to Clostridium difficile, not specified as recurrent: Secondary | ICD-10-CM | POA: Diagnosis present

## 2015-12-04 DIAGNOSIS — J440 Chronic obstructive pulmonary disease with acute lower respiratory infection: Secondary | ICD-10-CM | POA: Diagnosis not present

## 2015-12-04 DIAGNOSIS — R64 Cachexia: Secondary | ICD-10-CM | POA: Diagnosis present

## 2015-12-04 DIAGNOSIS — N189 Chronic kidney disease, unspecified: Secondary | ICD-10-CM | POA: Diagnosis present

## 2015-12-04 DIAGNOSIS — Z1624 Resistance to multiple antibiotics: Secondary | ICD-10-CM | POA: Diagnosis not present

## 2015-12-04 DIAGNOSIS — Z931 Gastrostomy status: Secondary | ICD-10-CM

## 2015-12-04 DIAGNOSIS — R7881 Bacteremia: Secondary | ICD-10-CM | POA: Diagnosis present

## 2015-12-04 DIAGNOSIS — J9621 Acute and chronic respiratory failure with hypoxia: Secondary | ICD-10-CM | POA: Diagnosis present

## 2015-12-04 DIAGNOSIS — N179 Acute kidney failure, unspecified: Secondary | ICD-10-CM | POA: Diagnosis present

## 2015-12-04 DIAGNOSIS — G9340 Encephalopathy, unspecified: Secondary | ICD-10-CM | POA: Diagnosis not present

## 2015-12-04 DIAGNOSIS — J9601 Acute respiratory failure with hypoxia: Secondary | ICD-10-CM | POA: Diagnosis not present

## 2015-12-04 DIAGNOSIS — E43 Unspecified severe protein-calorie malnutrition: Secondary | ICD-10-CM | POA: Diagnosis not present

## 2015-12-04 DIAGNOSIS — G8929 Other chronic pain: Secondary | ICD-10-CM | POA: Diagnosis present

## 2015-12-04 DIAGNOSIS — E1122 Type 2 diabetes mellitus with diabetic chronic kidney disease: Secondary | ICD-10-CM | POA: Diagnosis not present

## 2015-12-04 DIAGNOSIS — A4181 Sepsis due to Enterococcus: Secondary | ICD-10-CM | POA: Diagnosis not present

## 2015-12-04 DIAGNOSIS — Z930 Tracheostomy status: Secondary | ICD-10-CM | POA: Diagnosis not present

## 2015-12-04 DIAGNOSIS — R6521 Severe sepsis with septic shock: Secondary | ICD-10-CM | POA: Diagnosis present

## 2015-12-04 DIAGNOSIS — A419 Sepsis, unspecified organism: Secondary | ICD-10-CM | POA: Insufficient documentation

## 2015-12-04 DIAGNOSIS — Z66 Do not resuscitate: Secondary | ICD-10-CM | POA: Diagnosis not present

## 2015-12-04 DIAGNOSIS — Y95 Nosocomial condition: Secondary | ICD-10-CM | POA: Diagnosis present

## 2015-12-04 DIAGNOSIS — J189 Pneumonia, unspecified organism: Secondary | ICD-10-CM | POA: Diagnosis present

## 2015-12-04 DIAGNOSIS — A4152 Sepsis due to Pseudomonas: Secondary | ICD-10-CM | POA: Diagnosis not present

## 2015-12-04 DIAGNOSIS — F418 Other specified anxiety disorders: Secondary | ICD-10-CM | POA: Diagnosis not present

## 2015-12-04 DIAGNOSIS — Z515 Encounter for palliative care: Secondary | ICD-10-CM | POA: Diagnosis not present

## 2015-12-04 DIAGNOSIS — R6520 Severe sepsis without septic shock: Secondary | ICD-10-CM | POA: Diagnosis not present

## 2015-12-04 DIAGNOSIS — Z9911 Dependence on respirator [ventilator] status: Secondary | ICD-10-CM | POA: Diagnosis not present

## 2015-12-04 DIAGNOSIS — L89154 Pressure ulcer of sacral region, stage 4: Secondary | ICD-10-CM | POA: Diagnosis present

## 2015-12-04 DIAGNOSIS — R4182 Altered mental status, unspecified: Secondary | ICD-10-CM | POA: Diagnosis present

## 2015-12-04 DIAGNOSIS — J9600 Acute respiratory failure, unspecified whether with hypoxia or hypercapnia: Secondary | ICD-10-CM | POA: Diagnosis not present

## 2015-12-04 DIAGNOSIS — R680 Hypothermia, not associated with low environmental temperature: Secondary | ICD-10-CM | POA: Diagnosis not present

## 2015-12-04 DIAGNOSIS — A047 Enterocolitis due to Clostridium difficile: Secondary | ICD-10-CM | POA: Diagnosis present

## 2015-12-04 DIAGNOSIS — D649 Anemia, unspecified: Secondary | ICD-10-CM

## 2015-12-04 DIAGNOSIS — Z794 Long term (current) use of insulin: Secondary | ICD-10-CM

## 2015-12-04 DIAGNOSIS — Z452 Encounter for adjustment and management of vascular access device: Secondary | ICD-10-CM | POA: Diagnosis not present

## 2015-12-04 DIAGNOSIS — R1310 Dysphagia, unspecified: Secondary | ICD-10-CM | POA: Diagnosis not present

## 2015-12-04 DIAGNOSIS — A498 Other bacterial infections of unspecified site: Secondary | ICD-10-CM | POA: Diagnosis present

## 2015-12-04 DIAGNOSIS — Z86718 Personal history of other venous thrombosis and embolism: Secondary | ICD-10-CM | POA: Diagnosis not present

## 2015-12-04 DIAGNOSIS — I959 Hypotension, unspecified: Secondary | ICD-10-CM

## 2015-12-04 DIAGNOSIS — J9690 Respiratory failure, unspecified, unspecified whether with hypoxia or hypercapnia: Secondary | ICD-10-CM

## 2015-12-04 DIAGNOSIS — A4189 Other specified sepsis: Secondary | ICD-10-CM | POA: Diagnosis not present

## 2015-12-04 DIAGNOSIS — J9612 Chronic respiratory failure with hypercapnia: Secondary | ICD-10-CM | POA: Diagnosis not present

## 2015-12-04 DIAGNOSIS — Z1619 Resistance to other specified beta lactam antibiotics: Secondary | ICD-10-CM | POA: Diagnosis present

## 2015-12-04 DIAGNOSIS — A414 Sepsis due to anaerobes: Secondary | ICD-10-CM | POA: Diagnosis not present

## 2015-12-04 LAB — COMPREHENSIVE METABOLIC PANEL
ALBUMIN: 1.2 g/dL — AB (ref 3.5–5.0)
ALK PHOS: 240 U/L — AB (ref 38–126)
ALT: 14 U/L (ref 14–54)
AST: 26 U/L (ref 15–41)
Anion gap: 14 (ref 5–15)
BUN: 169 mg/dL — AB (ref 6–20)
CALCIUM: 9.1 mg/dL (ref 8.9–10.3)
CO2: 38 mmol/L — AB (ref 22–32)
CREATININE: 1.51 mg/dL — AB (ref 0.44–1.00)
Chloride: 89 mmol/L — ABNORMAL LOW (ref 101–111)
GFR calc Af Amer: 39 mL/min — ABNORMAL LOW (ref >60)
GFR calc non Af Amer: 34 mL/min — ABNORMAL LOW (ref >60)
GLUCOSE: 207 mg/dL — AB (ref 65–99)
Potassium: 5.1 mmol/L (ref 3.5–5.1)
SODIUM: 141 mmol/L (ref 135–145)
Total Bilirubin: 0.5 mg/dL (ref 0.3–1.2)
Total Protein: 6.3 g/dL — ABNORMAL LOW (ref 6.5–8.1)

## 2015-12-04 LAB — URINALYSIS, ROUTINE W REFLEX MICROSCOPIC
BILIRUBIN URINE: NEGATIVE
Glucose, UA: NEGATIVE mg/dL
Ketones, ur: NEGATIVE mg/dL
Nitrite: NEGATIVE
PH: 5.5 (ref 5.0–8.0)
Protein, ur: 30 mg/dL — AB
SPECIFIC GRAVITY, URINE: 1.017 (ref 1.005–1.030)

## 2015-12-04 LAB — CBC WITH DIFFERENTIAL/PLATELET
BASOS ABS: 0 10*3/uL (ref 0.0–0.1)
Basophils Relative: 0 %
EOS PCT: 0 %
Eosinophils Absolute: 0 10*3/uL (ref 0.0–0.7)
HCT: 20.8 % — ABNORMAL LOW (ref 36.0–46.0)
HEMOGLOBIN: 6.1 g/dL — AB (ref 12.0–15.0)
LYMPHS ABS: 0.6 10*3/uL — AB (ref 0.7–4.0)
Lymphocytes Relative: 2 %
MCH: 25.6 pg — AB (ref 26.0–34.0)
MCHC: 29.3 g/dL — AB (ref 30.0–36.0)
MCV: 87.4 fL (ref 78.0–100.0)
MONOS PCT: 1 %
Monocytes Absolute: 0.5 10*3/uL (ref 0.1–1.0)
NEUTROS PCT: 97 %
Neutro Abs: 36.3 10*3/uL — ABNORMAL HIGH (ref 1.7–7.7)
Other: 0 %
Platelets: 729 10*3/uL — ABNORMAL HIGH (ref 150–400)
RBC: 2.38 MIL/uL — ABNORMAL LOW (ref 3.87–5.11)
RDW: 19.6 % — ABNORMAL HIGH (ref 11.5–15.5)
WBC: 39.4 10*3/uL — ABNORMAL HIGH (ref 4.0–10.5)

## 2015-12-04 LAB — I-STAT ARTERIAL BLOOD GAS, ED
Acid-Base Excess: 18 mmol/L — ABNORMAL HIGH (ref 0.0–2.0)
BICARBONATE: 45 meq/L — AB (ref 20.0–24.0)
O2 Saturation: 88 %
PH ART: 7.356 (ref 7.350–7.450)
PO2 ART: 72 mmHg — AB (ref 80.0–100.0)
Patient temperature: 104
TCO2: 47 mmol/L (ref 0–100)
pCO2 arterial: 82.7 mmHg (ref 35.0–45.0)

## 2015-12-04 LAB — PREPARE RBC (CROSSMATCH)

## 2015-12-04 LAB — I-STAT CG4 LACTIC ACID, ED: Lactic Acid, Venous: 2.53 mmol/L (ref 0.5–2.0)

## 2015-12-04 LAB — URINE MICROSCOPIC-ADD ON

## 2015-12-04 LAB — ABO/RH: ABO/RH(D): O NEG

## 2015-12-04 LAB — LACTIC ACID, PLASMA: LACTIC ACID, VENOUS: 3.4 mmol/L — AB (ref 0.5–2.0)

## 2015-12-04 LAB — POC OCCULT BLOOD, ED: Fecal Occult Bld: NEGATIVE

## 2015-12-04 MED ORDER — SODIUM CHLORIDE 0.9 % IV BOLUS (SEPSIS)
1000.0000 mL | Freq: Once | INTRAVENOUS | Status: AC
  Administered 2015-12-04: 1000 mL via INTRAVENOUS

## 2015-12-04 MED ORDER — NOREPINEPHRINE BITARTRATE 1 MG/ML IV SOLN
0.0000 ug/min | INTRAVENOUS | Status: DC
  Filled 2015-12-04: qty 4

## 2015-12-04 MED ORDER — SODIUM CHLORIDE 0.9 % IV SOLN
INTRAVENOUS | Status: DC
  Administered 2015-12-05: 75 mL/h via INTRAVENOUS
  Administered 2015-12-06 – 2015-12-09 (×4): via INTRAVENOUS

## 2015-12-04 MED ORDER — ACETAMINOPHEN 325 MG PO TABS: 650.0000 mg | ORAL_TABLET | Freq: Once | ORAL | Status: DC | PRN

## 2015-12-04 MED ORDER — INSULIN ASPART 100 UNIT/ML ~~LOC~~ SOLN
0.0000 [IU] | SUBCUTANEOUS | Status: DC
  Administered 2015-12-05: 3 [IU] via SUBCUTANEOUS
  Administered 2015-12-05: 2 [IU] via SUBCUTANEOUS
  Administered 2015-12-05: 3 [IU] via SUBCUTANEOUS
  Administered 2015-12-05 – 2015-12-06 (×7): 2 [IU] via SUBCUTANEOUS
  Administered 2015-12-11 – 2015-12-15 (×12): 1 [IU] via SUBCUTANEOUS
  Administered 2015-12-15: 2 [IU] via SUBCUTANEOUS
  Administered 2015-12-16 – 2015-12-17 (×5): 1 [IU] via SUBCUTANEOUS
  Administered 2015-12-17: 2 [IU] via SUBCUTANEOUS
  Administered 2015-12-18 (×3): 1 [IU] via SUBCUTANEOUS

## 2015-12-04 MED ORDER — NOREPINEPHRINE BITARTRATE 1 MG/ML IV SOLN
0.0000 ug/min | Freq: Once | INTRAVENOUS | Status: AC
  Administered 2015-12-04: 5 ug/min via INTRAVENOUS
  Filled 2015-12-04: qty 4

## 2015-12-04 MED ORDER — LEVOFLOXACIN IN D5W 500 MG/100ML IV SOLN: 500.0000 mg | INTRAVENOUS | Status: DC

## 2015-12-04 MED ORDER — PIPERACILLIN-TAZOBACTAM 3.375 G IVPB 30 MIN
3.3750 g | Freq: Once | INTRAVENOUS | Status: AC
  Administered 2015-12-04: 3.375 g via INTRAVENOUS
  Filled 2015-12-04: qty 50

## 2015-12-04 MED ORDER — CIPROFLOXACIN IN D5W 200 MG/100ML IV SOLN
200.0000 mg | INTRAVENOUS | Status: DC
  Administered 2015-12-05: 200 mg via INTRAVENOUS
  Filled 2015-12-04 (×2): qty 100

## 2015-12-04 MED ORDER — SODIUM CHLORIDE 0.9 % IV SOLN: Freq: Once | INTRAVENOUS | Status: DC

## 2015-12-04 MED ORDER — MEMANTINE HCL 5 MG PO TABS
5.0000 mg | ORAL_TABLET | Freq: Two times a day (BID) | ORAL | Status: DC
  Administered 2015-12-05: 5 mg
  Filled 2015-12-04 (×8): qty 1

## 2015-12-04 MED ORDER — PANTOPRAZOLE SODIUM 40 MG IV SOLR
40.0000 mg | Freq: Every day | INTRAVENOUS | Status: DC
  Administered 2015-12-04 – 2015-12-07 (×4): 40 mg via INTRAVENOUS
  Filled 2015-12-04 (×5): qty 40

## 2015-12-04 MED ORDER — VANCOMYCIN HCL IN DEXTROSE 1-5 GM/200ML-% IV SOLN
1000.0000 mg | Freq: Once | INTRAVENOUS | Status: AC
  Administered 2015-12-04: 1000 mg via INTRAVENOUS
  Filled 2015-12-04: qty 200

## 2015-12-04 MED ORDER — SODIUM CHLORIDE 0.9 % IV BOLUS (SEPSIS)
1000.0000 mL | Freq: Once | INTRAVENOUS | Status: AC
Start: 2015-12-04 — End: 2015-12-04
  Administered 2015-12-04: 1000 mL via INTRAVENOUS

## 2015-12-04 MED ORDER — HEPARIN SODIUM (PORCINE) 5000 UNIT/ML IJ SOLN
5000.0000 [IU] | Freq: Three times a day (TID) | INTRAMUSCULAR | Status: DC
  Administered 2015-12-04 – 2015-12-18 (×40): 5000 [IU] via SUBCUTANEOUS
  Filled 2015-12-04 (×42): qty 1

## 2015-12-04 MED ORDER — VANCOMYCIN HCL 500 MG IV SOLR
500.0000 mg | INTRAVENOUS | Status: DC
  Administered 2015-12-05 – 2015-12-08 (×4): 500 mg via INTRAVENOUS
  Filled 2015-12-04 (×6): qty 500

## 2015-12-04 MED ORDER — SODIUM CHLORIDE 0.9 % IV SOLN: 250.0000 mL | INTRAVENOUS | Status: DC | PRN

## 2015-12-04 MED ORDER — ACETAMINOPHEN 650 MG RE SUPP
650.0000 mg | Freq: Once | RECTAL | Status: AC
  Administered 2015-12-04: 650 mg via RECTAL
  Filled 2015-12-04: qty 1

## 2015-12-04 MED ORDER — PIPERACILLIN-TAZOBACTAM 3.375 G IVPB
3.3750 g | Freq: Three times a day (TID) | INTRAVENOUS | Status: DC
  Administered 2015-12-05: 3.375 g via INTRAVENOUS
  Filled 2015-12-04 (×3): qty 50

## 2015-12-04 MED ORDER — SODIUM CHLORIDE 0.9 % IV SOLN: Freq: Once | INTRAVENOUS | Status: AC

## 2015-12-04 MED ORDER — LEVOFLOXACIN IN D5W 750 MG/150ML IV SOLN: 750.0000 mg | Freq: Once | INTRAVENOUS | Status: DC

## 2015-12-04 MED ORDER — NOREPINEPHRINE BITARTRATE 1 MG/ML IV SOLN
0.0000 ug/min | INTRAVENOUS | Status: DC
  Administered 2015-12-05: 15 ug/min via INTRAVENOUS
  Administered 2015-12-05: 4 ug/min via INTRAVENOUS
  Administered 2015-12-06: 2 ug/min via INTRAVENOUS
  Filled 2015-12-04 (×2): qty 4

## 2015-12-04 NOTE — Progress Notes (Addendum)
Pharmacy Antibiotic Note  Kristen Arnold is a 70 y.o. female admitted on 12/04/2015 with pneumonia and sepsis.  Pharmacy has been consulted for vancomycin, levofloxacin, and zosyn dosing.  Plan: Vancomycin 1000 mg x 1 then 500 mg  IV every 24 hours.  Goal trough 15-20 mcg/mL. Zosyn 3.375g IV q8h (4 hour infusion). Ciprofloxacin 200 mg q24h  Height: 5' (152.4 cm) Weight: 100 lb (45.36 kg) IBW/kg (Calculated) : 45.5  Temp (24hrs), Avg:101.9 F (38.8 C), Min:101 F (38.3 C), Max:102.7 F (39.3 C)   Recent Labs Lab 12/04/15 1928 12/04/15 2003  WBC 39.4*  --   CREATININE 1.51*  --   LATICACIDVEN  --  2.53*    Estimated Creatinine Clearance: 24.8 mL/min (by C-G formula based on Cr of 1.51).    Allergies  Allergen Reactions  . Codeine     Unknown reaction, listed on MAR    Antimicrobials this admission: Vancomycin 5/4>> Ciprofloxacin 5/4>> Zosyn 5/4>>  Dose adjustments this admission: N/A  Microbiology results: 5/4 Blood x 2 5/4 Urine Resp  Isaac BlissMichael Jalila Goodnough, PharmD, BCPS, [facility] CenterBCCCP Clinical Pharmacist Pager [phone] 12/04/2015 10:30 PM

## 2015-12-04 NOTE — ED Notes (Signed)
Discussed plan of care with CT. Coordinating to transport with respiratory therapist.

## 2015-12-04 NOTE — ED Notes (Signed)
Pt arrives EMS from [facility] facility where pt had 2 day hx of decreased loc, tachypnea

## 2015-12-04 NOTE — ED Notes (Signed)
Discussed case with dr. Patria Maneampos, desats and bp dropping. ekg changes noted on telemetry. MD acknowledges, called to room to assess.

## 2015-12-04 NOTE — H&P (Signed)
PULMONARY / CRITICAL CARE MEDICINE   Name: Kristen Arnold MRN: [id number] DOB: Jun 04, 1946    ADMISSION DATE:  12/04/2015 CONSULTATION DATE:  12/04/15  REFERRING MD:  EDP  CHIEF COMPLAINT:  AMS  HISTORY OF PRESENT ILLNESS:  Pt is encephelopathic; therefore, this HPI is obtained from chart review. Kristen Arnold is a 70 y.o. female with PMH as outlined below including tracheostomy status after failure to wean from vent following right hemicolectomy for bowel obstruction in December 2016.  She had recent hospitalization to [facility] ICU 09/26/15 through 10/01/15 for aspiration PNA.  During that admission she failed swallow eval so her chronic G tube was exchanged for J tube by IR.  Blood cultures that admission were noteable for coag neg staph and pseudomonas aeruginosa, sputum cultures were also noteable for pseudomonas (sputum resistant to both imipenem and zosyn; sputum and blood were both only sensitive to cipro).  Due to her hx of C.diff, she was started on oral vanc per recs from ID. Following her course of abx, she was discharged back to [facility].  On 05/04, she was brought to [facility] ED due to reports that she had AMS and had been minimally responsive x 24 hours.  In ED, she was found to have septic shock presumed due to LLL HCAP as well as anemia with Hgb 6.1.  She was started on levophed and 2u PRBC were ordered.  PAST MEDICAL HISTORY :  She  has a past medical history of Bowel obstruction (HCC); Anemia; COPD (chronic obstructive pulmonary disease) (HCC); Dementia; Acute on chronic respiratory failure (HCC); Anxiety; Depression; Diabetes mellitus without complication (HCC); DVT (deep venous thrombosis) (HCC); Recurrent Clostridium difficile diarrhea; History of Pseudomonas pneumonia; Severe protein-calorie malnutrition (HCC); and Chronic pain.  PAST SURGICAL HISTORY: She  has past surgical history that includes Tracheostomy (07/14/2015); PEG tube placement (07/14/2015); Bowel resection; Hemicolectomy  (Right, 06/24/2015); bronchoscopies; Ventral hernia repair (????); and Incisional hernia repair (06/24/2015).  Allergies  Allergen Reactions  . Codeine     Unknown reaction, listed on MAR    No current facility-administered medications on file prior to encounter.   Current Outpatient Prescriptions on File Prior to Encounter  Medication Sig  . acetaminophen (TYLENOL) 325 MG tablet 650 mg by PEG Tube route every 4 (four) hours as needed for moderate pain.  . chlorhexidine (PERIDEX) 0.12 % solution Use as directed 15 mLs in the mouth or throat 2 (two) times daily.  Marland Kitchen ipratropium-albuterol (DUONEB) 0.5-2.5 (3) MG/3ML SOLN Take 3 mLs by nebulization every 6 (six) hours.  . magnesium oxide (MAG-OX) 400 MG tablet 400 mg by PEG Tube route 3 (three) times daily.  . metoCLOPramide (REGLAN) 5 MG tablet 5 mg by PEG Tube route every 6 (six) hours.  . Nutritional Supplements (FEEDING SUPPLEMENT, VITAL AF 1.2 CAL,) LIQD Place 1,000 mLs into feeding tube daily.  . pantoprazole (PROTONIX) 40 MG tablet 40 mg by PEG Tube route 2 (two) times daily.  . vitamin C (ASCORBIC ACID) 500 MG tablet 500 mg by PEG Tube route daily.  . ciprofloxacin (CIPRO) 400 MG/200ML SOLN Inject 200 mLs (400 mg total) into the vein every 8 (eight) hours.  . Dextrose-Sodium Chloride (DEXTROSE 5 % AND 0.45% NACL) infusion At 75 ml/hr  . fluconazole (DIFLUCAN) 100-0.9 MG/50ML-% SOLN IVPB Inject 50 mLs (100 mg total) into the vein daily.  . vancomycin (VANCOCIN) 1 GM/200ML SOLN Inject 200 mLs (1,000 mg total) into the vein daily.  . vancomycin (VANCOCIN) 50 mg/mL oral solution Take 10 mLs (500  mg total) by mouth every 6 (six) hours.    FAMILY HISTORY:  Her has no family status information on file.   SOCIAL HISTORY: She  reports that she has quit smoking. Her smoking use included Cigarettes. She has never used smokeless tobacco. She reports that she does not drink alcohol or use illicit drugs.  REVIEW OF SYSTEMS:   Unable to obtain  as pt is encephalopathic.  SUBJECTIVE:  On vent via trach.  Unresponsive.  VITAL SIGNS: BP 114/49 mmHg  Pulse 96  Temp(Src) 101 F (38.3 C) (Rectal)  Resp 16  Ht 5' (1.524 m)  Wt 100 lb (45.36 kg)  BMI 19.53 kg/m2  SpO2 91%  HEMODYNAMICS:    VENTILATOR SETTINGS: Vent Mode:  [-] PRVC FiO2 (%):  [100 %] 100 % Set Rate:  [16 bmp] 16 bmp Vt Set:  [480 mL] 480 mL PEEP:  [5 cmH20] 5 cmH20 Plateau Pressure:  [23 cmH20] 23 cmH20  INTAKE / OUTPUT:     PHYSICAL EXAMINATION: General: Chronically ill appearing elderly female, cachectic, in NAD. Neuro: Non-responsive.  Does not respond to pain. HEENT: [place]/AT. PERRL, sclerae anicteric.  Trach in place and site is C/D/I. Cardiovascular: RRR, no M/R/G.  Lungs: Respirations even and unlabored.  CTA bilaterally, No W/R/R. Abdomen: BS x 4, soft, NT/ND. J tube in place. Musculoskeletal: No gross deformities, no edema.  Skin: Intact, warm, no rashes.  LABS:  BMET  Recent Labs Lab 12/04/15 1928  NA 141  K 5.1  CL 89*  CO2 38*  BUN 169*  CREATININE 1.51*  GLUCOSE 207*    Electrolytes  Recent Labs Lab 12/04/15 1928  CALCIUM 9.1    CBC  Recent Labs Lab 12/04/15 1928  WBC 39.4*  HGB 6.1*  HCT 20.8*  PLT 729*    Coag's No results for input(s): APTT, INR in the last 168 hours.  Sepsis Markers  Recent Labs Lab 12/04/15 2003  LATICACIDVEN 2.53*    ABG  Recent Labs Lab 12/04/15 1912  PHART 7.356  PCO2ART 82.7*  PO2ART 72.0*    Liver Enzymes  Recent Labs Lab 12/04/15 1928  AST 26  ALT 14  ALKPHOS 240*  BILITOT 0.5  ALBUMIN 1.2*    Cardiac Enzymes No results for input(s): TROPONINI, PROBNP in the last 168 hours.  Glucose No results for input(s): GLUCAP in the last 168 hours.  Imaging Dg Chest Port 1 View  12/04/2015  CLINICAL DATA:  Altered mental status.  Respiratory failure. EXAM: PORTABLE CHEST 1 VIEW COMPARISON:  09/26/2015 FINDINGS: Tracheostomy is centered on the tracheal air  column. Left upper extremity PICC line extends to the expected location of the SVC just above the azygos vein junction. There is consolidation and effusion in the left base. This may represent pneumonia with parapneumonic effusion. Right lung is clear. Pulmonary vasculature is normal. Hilar and mediastinal contours are unremarkable. IMPRESSION: Left base consolidation and effusion. Suspect pneumonia. Followup PA and lateral chest X-ray is recommended in 3-4 weeks following trial of antibiotic therapy to ensure resolution and exclude underlying malignancy. Electronically Signed   By: Ellery Plunk M.D.   On: 12/04/2015 19:35     STUDIES:  CXR 05/04 > left HCAP.  CULTURES: Blood 05/04 > Urine 05/04 > Sputum 05/04 >  ANTIBIOTICS: Vanc 05/04 > Zosyn 05/04 > Cipro 05/04 >  SIGNIFICANT EVENTS: 05/04 > admitted with septic shock presumed due to HCAP as well as anemia (Hgb 6.1).  LINES/TUBES: Trach (chronic) >  J tube (chronic) > L  PICC (chronic) > will remove 05/04 as it is leaking. CVL pending 05/04 >  DISCUSSION: 70 y.o. F with chronic trach / J tube from [facility], admitted 05/04 with septic shock and anemia.  ASSESSMENT / PLAN:  PULMONARY A: Chronic tracheostomy / vent dependence. LLL HCAP. Hx COPD. P:   Full vent support. Wean as able. VAP prevention measures. No SBT given that she has failed multiple times in the past. Abx / cultures per ID section. CXR in AM.  CARDIOVASCULAR A:  Septic shock - presumed due to HCAP. P:  Continue levophed as needed to maintain MAP > 65. Place CVL.   Assess CVP's, cortisol. Trend troponins, lactate. Consider echo. Hold outpatient carvedilol.  INFECTIOUS A:   Septic shock - presumed due to HCAP.  Hx pseudomonal and coag neg staph bacteremia last admission in Feb. Hx C.diff. P:   Abx as above (vanc / zosyn / cipro).  Follow cultures as above. PCT algorithm to limit abx exposure.  HEMATOLOGIC A:   Anemia - acute on  chronic.  Hgb now down to 6.1. VTE Prophylaxis. Hx DVT. P:  Transfuse 2u PRBC now then repeat H/H. SCD's / Heparin. CBC in AM.  NEUROLOGIC A:   AMS - unclear etiology, consider due to sepsis. Hx anxiety / depression, chronic pain. P:   Monitor clinically. Limit sedating meds. Hold outpatient lorazepam, sertraline.  RENAL A:   AKI. P:   NS @ 75. BMP in AM.  GASTROINTESTINAL A:   GI prophylaxis. Nutrition. P:   SUP: Pantoprazole. Consult nutrition for TF's.  ENDOCRINE A:   DM. P:   SSI.  Family updated: None.  Interdisciplinary Family Meeting v Palliative Care Meeting:  Due by: 12/10/15.  CC time: 40 minutes.   Rutherford Guysahul Lateria Alderman, GeorgiaPA - [facility] [place] [facility] Pager: [phone]  or [phone] 12/04/2015, 10:18 PM

## 2015-12-04 NOTE — ED Notes (Signed)
Central line kit readily available, patient travelling to CT with Thedore Mins, RN

## 2015-12-04 NOTE — ED Notes (Signed)
Portable chest x-ray at the bedside.  

## 2015-12-04 NOTE — ED Notes (Signed)
PICC line leaking, with no blood return in all three lumens. ICU MD made aware. Waiting on resp to go to CT. ALL meds now infusing in 20 gauge left forearm.

## 2015-12-04 NOTE — ED Provider Notes (Signed)
CSN: [id number]     Arrival date & time 12/04/15  1849 History   First MD Initiated Contact with Patient 12/04/15 1854     Chief Complaint  Patient presents with  . Altered Mental Status     (Consider location/radiation/quality/duration/timing/severity/associated sxs/prior Treatment) HPI  Patient is a chronically ill female who presents from [facility]. History is limited due to absence of care providers and unresponsiveness. According to EMS the patient has been altered, and has become unresponsive over the past 24 hours. She is currently at [facility] for long-term care after failure to wean from a ventilator following surgery, requiring trach, suffering an aspiration pneumonia, and now G-tube feeding dependent. She is full code. She arrives to the emergency department obtunded, responding only to deep painful stimulation, nonverbal, diaphoretic and warm. Past Medical History  Diagnosis Date  . Bowel obstruction (HCC)     colectomy 06/2015  . Anemia   . COPD (chronic obstructive pulmonary disease) (HCC)   . Dementia   . Acute on chronic respiratory failure (HCC)   . Anxiety   . Depression   . Diabetes mellitus without complication (HCC)   . DVT (deep venous thrombosis) (HCC)   . Recurrent Clostridium difficile diarrhea   . History of Pseudomonas pneumonia   . Severe protein-calorie malnutrition (HCC)   . Chronic pain    Past Surgical History  Procedure Laterality Date  . Tracheostomy  07/14/2015    [facility]  . Peg tube placement  07/14/2015    [facility]  . Bowel resection      right hemicolectomy, [facility]  . Hemicolectomy Right 06/24/2015  . Bronchoscopies    . Ventral hernia repair  ????  . Incisional hernia repair  06/24/2015    with colectomy   History reviewed. No pertinent family history. Social History  Substance Use Topics  . Smoking status: Former Smoker    Types: Cigarettes  . Smokeless tobacco: Never Used  . Alcohol Use: No   OB History    No data available     Review of Systems  Unable to perform ROS: Patient unresponsive      Allergies  Codeine  Home Medications   Prior to Admission medications   Medication Sig Start Date End Date Taking? Authorizing Provider  acetaminophen (TYLENOL) 325 MG tablet 650 mg by PEG Tube route every 4 (four) hours as needed for moderate pain.   Yes Historical Provider, MD  carvedilol (COREG) 3.125 MG tablet Take 3.125 mg by mouth 2 (two) times daily with a meal.   Yes Historical Provider, MD  cefTRIAXone 1 g in dextrose 5 % 50 mL Inject 1 g into the vein daily. 1 gram IV every 24 hours for 7 days for UTI   Yes Historical Provider, MD  chlorhexidine (PERIDEX) 0.12 % solution Use as directed 15 mLs in the mouth or throat 2 (two) times daily.   Yes Historical Provider, MD  insulin regular (NOVOLIN R,HUMULIN R) 100 units/mL injection Inject 5 Units into the skin 4 (four) times daily.   Yes Historical Provider, MD  ipratropium-albuterol (DUONEB) 0.5-2.5 (3) MG/3ML SOLN Take 3 mLs by nebulization every 6 (six) hours.   Yes Historical Provider, MD  LORazepam (ATIVAN) 0.5 MG tablet Take 0.5 mg by mouth every 8 (eight) hours. Take 1.5 tablets as needed every 8 hours as needed for anxiety.   Yes Historical Provider, MD  magnesium oxide (MAG-OX) 400 MG tablet 400 mg by PEG Tube route 3 (three) times daily.  Yes Historical Provider, MD  memantine (NAMENDA) 5 MG tablet Take 5 mg by mouth 2 (two) times daily.   Yes Historical Provider, MD  metoCLOPramide (REGLAN) 5 MG tablet 5 mg by PEG Tube route every 6 (six) hours.   Yes Historical Provider, MD  Nutritional Supplements (FEEDING SUPPLEMENT, VITAL AF 1.2 CAL,) LIQD Place 1,000 mLs into feeding tube daily. 10/01/15  Yes Simonne Martinet, NP  pantoprazole (PROTONIX) 40 MG tablet 40 mg by PEG Tube route 2 (two) times daily.   Yes Historical Provider, MD  sertraline (ZOLOFT) 50 MG tablet Take 50 mg by mouth daily.   Yes Historical Provider, MD  vitamin C  (ASCORBIC ACID) 500 MG tablet 500 mg by PEG Tube route daily.   Yes Historical Provider, MD  ciprofloxacin (CIPRO) 400 MG/200ML SOLN Inject 200 mLs (400 mg total) into the vein every 8 (eight) hours. 10/01/15   Simonne Martinet, NP  Dextrose-Sodium Chloride (DEXTROSE 5 % AND 0.45% NACL) infusion At 75 ml/hr 10/01/15   Simonne Martinet, NP  fluconazole (DIFLUCAN) 100-0.9 MG/50ML-% SOLN IVPB Inject 50 mLs (100 mg total) into the vein daily. 10/01/15   Simonne Martinet, NP  vancomycin (VANCOCIN) 1 GM/200ML SOLN Inject 200 mLs (1,000 mg total) into the vein daily. 10/01/15   Simonne Martinet, NP  vancomycin (VANCOCIN) 50 mg/mL oral solution Take 10 mLs (500 mg total) by mouth every 6 (six) hours. 10/01/15   Simonne Martinet, NP   BP 113/48 mmHg  Pulse 91  Temp(Src) 101 F (38.3 C) (Rectal)  Resp 26  Ht 5' (1.524 m)  Wt 45.36 kg  BMI 19.53 kg/m2  SpO2 100% Physical Exam  Constitutional:  Chronically ill-appearing  HENT:  Sunken eyes  Neck: JVD present.  Tracheostomy in place  Cardiovascular:  Tachycardia, regular rhythm  Pulmonary/Chest:  Tachypnea, shallow respirations, mechanically ventilated  Abdominal: Soft. She exhibits no distension.  Neurological:  Response to deep painful stimuli only  Skin:  Cool extremities    ED Course  Procedures (including critical care time) Labs Review Labs Reviewed  COMPREHENSIVE METABOLIC PANEL - Abnormal; Notable for the following:    Chloride 89 (*)    CO2 38 (*)    Glucose, Bld 207 (*)    BUN 169 (*)    Creatinine, Ser 1.51 (*)    Total Protein 6.3 (*)    Albumin 1.2 (*)    Alkaline Phosphatase 240 (*)    GFR calc non Af Amer 34 (*)    GFR calc Af Amer 39 (*)    All other components within normal limits  CBC WITH DIFFERENTIAL/PLATELET - Abnormal; Notable for the following:    WBC 39.4 (*)    RBC 2.38 (*)    Hemoglobin 6.1 (*)    HCT 20.8 (*)    MCH 25.6 (*)    MCHC 29.3 (*)    RDW 19.6 (*)    Platelets 729 (*)    Neutro Abs 36.3 (*)     Lymphs Abs 0.6 (*)    All other components within normal limits  URINALYSIS, ROUTINE W REFLEX MICROSCOPIC (NOT AT [facility]) - Abnormal; Notable for the following:    APPearance CLOUDY (*)    Hgb urine dipstick LARGE (*)    Protein, ur 30 (*)    Leukocytes, UA MODERATE (*)    All other components within normal limits  URINE MICROSCOPIC-ADD ON - Abnormal; Notable for the following:    Squamous Epithelial / LPF 0-5 (*)  Bacteria, UA FEW (*)    Casts HYALINE CASTS (*)    All other components within normal limits  LACTIC ACID, PLASMA - Abnormal; Notable for the following:    Lactic Acid, Venous 3.4 (*)    All other components within normal limits  I-STAT CG4 LACTIC ACID, ED - Abnormal; Notable for the following:    Lactic Acid, Venous 2.53 (*)    All other components within normal limits  I-STAT ARTERIAL BLOOD GAS, ED - Abnormal; Notable for the following:    pCO2 arterial 82.7 (*)    pO2, Arterial 72.0 (*)    Bicarbonate 45.0 (*)    Acid-Base Excess 18.0 (*)    All other components within normal limits  CULTURE, BLOOD (ROUTINE X 2)  CULTURE, BLOOD (ROUTINE X 2)  URINE CULTURE  CULTURE, RESPIRATORY (NON-EXPECTORATED)  MRSA PCR SCREENING  CBC  BASIC METABOLIC PANEL  BLOOD GAS, ARTERIAL  MAGNESIUM  PHOSPHORUS  LACTIC ACID, PLASMA  TROPONIN I  TROPONIN I  TROPONIN I  CORTISOL  BLOOD GAS, ARTERIAL  POC OCCULT BLOOD, ED  TYPE AND SCREEN  PREPARE RBC (CROSSMATCH)  ABO/RH    Imaging Review Dg Chest Port 1 View  12/04/2015  CLINICAL DATA:  Altered mental status.  Respiratory failure. EXAM: PORTABLE CHEST 1 VIEW COMPARISON:  09/26/2015 FINDINGS: Tracheostomy is centered on the tracheal air column. Left upper extremity PICC line extends to the expected location of the SVC just above the azygos vein junction. There is consolidation and effusion in the left base. This may represent pneumonia with parapneumonic effusion. Right lung is clear. Pulmonary vasculature is normal. Hilar and  mediastinal contours are unremarkable. IMPRESSION: Left base consolidation and effusion. Suspect pneumonia. Followup PA and lateral chest X-ray is recommended in 3-4 weeks following trial of antibiotic therapy to ensure resolution and exclude underlying malignancy. Electronically Signed   By: Ellery Plunk M.D.   On: 12/04/2015 19:35   I have personally reviewed and evaluated these images and lab results as part of my medical decision-making.   EKG Interpretation   Date/Time:  Thursday Dec 04 2015 21:17:48 EDT Ventricular Rate:  92 PR Interval:  174 QRS Duration: 80 QT Interval:  316 QTC Calculation: 390 R Axis:   77 Text Interpretation:  Normal sinus rhythm Marked ST abnormality, possible  inferior subendocardial injury Marked ST abnormality, possible anterior  subendocardial injury Abnormal ECG inferior lateral st depression worse  than prior ecg Confirmed by CAMPOS  MD, Caryn Bee ([id number]) on 12/04/2015 9:25:58  PM      MDM   Final diagnoses:  Acute respiratory failure with hypoxia (HCC)  HCAP (healthcare-associated pneumonia)  AKI (acute kidney injury) (HCC)  Anemia, unspecified anemia type  Hypotension, unspecified hypotension type    Patient presents with altered mental status, fever, sepsis likely secondary to healthcare associated pneumonia. Very little history available due to patient condition. On arrival the patient responds only to deep painful stimuli, is febrile and tachycardic. She has borderline hypotension. 30 mL/kg fluids ordered, old records reviewed, she is not currently on any antibiotics, but finished a course of antibiotics several weeks ago for urinary tract infection, and aspiration pneumonia.  Chest x-ray obtained and shows left lower lobe opacity with parapneumonic effusion, concerning for pneumonia. She was given broad-spectrum antibiotic to cover for healthcare associated pneumonia. Will admit to intensive care unit.  CBC results of with hemoglobin 6.1.  Patient is brown stool, no signs of external hemorrhage. Will transfuse 2 units of blood.  Patient has become  more hypotensive, will start levophed  Erskine Emeryhris Jasmine Mcbeth, MD 12/05/15 0009  Azalia BilisKevin Campos, MD 12/05/15 2340

## 2015-12-05 ENCOUNTER — Inpatient Hospital Stay (HOSPITAL_COMMUNITY): Payer: Medicare Other

## 2015-12-05 DIAGNOSIS — R7881 Bacteremia: Secondary | ICD-10-CM

## 2015-12-05 DIAGNOSIS — L89154 Pressure ulcer of sacral region, stage 4: Secondary | ICD-10-CM

## 2015-12-05 DIAGNOSIS — Z8619 Personal history of other infectious and parasitic diseases: Secondary | ICD-10-CM

## 2015-12-05 DIAGNOSIS — J9612 Chronic respiratory failure with hypercapnia: Secondary | ICD-10-CM

## 2015-12-05 DIAGNOSIS — Z452 Encounter for adjustment and management of vascular access device: Secondary | ICD-10-CM | POA: Diagnosis present

## 2015-12-05 DIAGNOSIS — K9423 Gastrostomy malfunction: Secondary | ICD-10-CM

## 2015-12-05 DIAGNOSIS — R6520 Severe sepsis without septic shock: Secondary | ICD-10-CM

## 2015-12-05 LAB — BASIC METABOLIC PANEL
ANION GAP: 15 (ref 5–15)
BUN: 164 mg/dL — AB (ref 6–20)
CALCIUM: 8.1 mg/dL — AB (ref 8.9–10.3)
CO2: 34 mmol/L — AB (ref 22–32)
Chloride: 90 mmol/L — ABNORMAL LOW (ref 101–111)
Creatinine, Ser: 1.66 mg/dL — ABNORMAL HIGH (ref 0.44–1.00)
GFR calc Af Amer: 35 mL/min — ABNORMAL LOW (ref >60)
GFR calc non Af Amer: 30 mL/min — ABNORMAL LOW (ref >60)
GLUCOSE: 254 mg/dL — AB (ref 65–99)
Potassium: 5.3 mmol/L — ABNORMAL HIGH (ref 3.5–5.1)
Sodium: 139 mmol/L (ref 135–145)

## 2015-12-05 LAB — BLOOD GAS, ARTERIAL
ACID-BASE EXCESS: 10.6 mmol/L — AB (ref 0.0–2.0)
BICARBONATE: 36.6 meq/L — AB (ref 20.0–24.0)
Drawn by: 36496
FIO2: 1
LHR: 16 {breaths}/min
O2 SAT: 99.2 %
PATIENT TEMPERATURE: 98.6
PCO2 ART: 69.6 mmHg — AB (ref 35.0–45.0)
PEEP: 5 cmH2O
TCO2: 38.7 mmol/L (ref 0–100)
VT: 480 mL
pH, Arterial: 7.341 — ABNORMAL LOW (ref 7.350–7.450)
pO2, Arterial: 178 mmHg — ABNORMAL HIGH (ref 80.0–100.0)

## 2015-12-05 LAB — GLUCOSE, CAPILLARY
GLUCOSE-CAPILLARY: 238 mg/dL — AB (ref 65–99)
GLUCOSE-CAPILLARY: 35 mg/dL — AB (ref 65–99)
Glucose-Capillary: 121 mg/dL — ABNORMAL HIGH (ref 65–99)
Glucose-Capillary: 142 mg/dL — ABNORMAL HIGH (ref 65–99)
Glucose-Capillary: 172 mg/dL — ABNORMAL HIGH (ref 65–99)
Glucose-Capillary: 180 mg/dL — ABNORMAL HIGH (ref 65–99)
Glucose-Capillary: 258 mg/dL — ABNORMAL HIGH (ref 65–99)

## 2015-12-05 LAB — BLOOD CULTURE ID PANEL (REFLEXED)
ACINETOBACTER BAUMANNII: NOT DETECTED
CANDIDA ALBICANS: NOT DETECTED
CANDIDA GLABRATA: NOT DETECTED
CANDIDA KRUSEI: NOT DETECTED
CANDIDA PARAPSILOSIS: NOT DETECTED
Candida tropicalis: NOT DETECTED
Carbapenem resistance: DETECTED — AB
ENTEROBACTER CLOACAE COMPLEX: NOT DETECTED
ENTEROBACTERIACEAE SPECIES: NOT DETECTED
ESCHERICHIA COLI: NOT DETECTED
Enterococcus species: DETECTED — AB
Haemophilus influenzae: NOT DETECTED
KLEBSIELLA OXYTOCA: NOT DETECTED
KLEBSIELLA PNEUMONIAE: DETECTED — AB
Listeria monocytogenes: NOT DETECTED
Methicillin resistance: NOT DETECTED
NEISSERIA MENINGITIDIS: NOT DETECTED
PSEUDOMONAS AERUGINOSA: DETECTED — AB
Proteus species: NOT DETECTED
STAPHYLOCOCCUS SPECIES: NOT DETECTED
STREPTOCOCCUS AGALACTIAE: NOT DETECTED
STREPTOCOCCUS PNEUMONIAE: NOT DETECTED
STREPTOCOCCUS SPECIES: NOT DETECTED
Serratia marcescens: NOT DETECTED
Staphylococcus aureus (BCID): NOT DETECTED
Streptococcus pyogenes: NOT DETECTED
Vancomycin resistance: NOT DETECTED

## 2015-12-05 LAB — LACTIC ACID, PLASMA: Lactic Acid, Venous: 2.1 mmol/L (ref 0.5–2.0)

## 2015-12-05 LAB — CBC
HEMATOCRIT: 23.6 % — AB (ref 36.0–46.0)
Hemoglobin: 7.3 g/dL — ABNORMAL LOW (ref 12.0–15.0)
MCH: 27.8 pg (ref 26.0–34.0)
MCHC: 30.9 g/dL (ref 30.0–36.0)
MCV: 89.7 fL (ref 78.0–100.0)
PLATELETS: 653 10*3/uL — AB (ref 150–400)
RBC: 2.63 MIL/uL — ABNORMAL LOW (ref 3.87–5.11)
RDW: 17.7 % — AB (ref 11.5–15.5)
WBC: 59.1 10*3/uL — AB (ref 4.0–10.5)

## 2015-12-05 LAB — MAGNESIUM: Magnesium: 2.3 mg/dL (ref 1.7–2.4)

## 2015-12-05 LAB — TROPONIN I
TROPONIN I: 0.27 ng/mL — AB (ref <0.031)
Troponin I: 0.18 ng/mL — ABNORMAL HIGH (ref <0.031)

## 2015-12-05 LAB — CORTISOL: CORTISOL PLASMA: 22.5 ug/dL

## 2015-12-05 LAB — PATHOLOGIST SMEAR REVIEW

## 2015-12-05 LAB — PHOSPHORUS: Phosphorus: 4.6 mg/dL (ref 2.5–4.6)

## 2015-12-05 LAB — MRSA PCR SCREENING: MRSA BY PCR: POSITIVE — AB

## 2015-12-05 LAB — C DIFFICILE QUICK SCREEN W PCR REFLEX
C DIFFICILE (CDIFF) INTERP: POSITIVE
C DIFFICILE (CDIFF) TOXIN: POSITIVE — AB
C DIFFICLE (CDIFF) ANTIGEN: POSITIVE — AB

## 2015-12-05 MED ORDER — VANCOMYCIN 50 MG/ML ORAL SOLUTION
500.0000 mg | Freq: Four times a day (QID) | ORAL | Status: DC
  Filled 2015-12-05: qty 10

## 2015-12-05 MED ORDER — CHLORHEXIDINE GLUCONATE 0.12% ORAL RINSE (MEDLINE KIT)
15.0000 mL | Freq: Two times a day (BID) | OROMUCOSAL | Status: DC
  Administered 2015-12-05 – 2015-12-18 (×26): 15 mL via OROMUCOSAL

## 2015-12-05 MED ORDER — VITAL HIGH PROTEIN PO LIQD: 1000.0000 mL | ORAL | Status: DC

## 2015-12-05 MED ORDER — DEXTROSE 5 % IV SOLN
0.9400 g | Freq: Two times a day (BID) | INTRAVENOUS | Status: DC
  Administered 2015-12-05 – 2015-12-10 (×10): 0.94 g via INTRAVENOUS
  Filled 2015-12-05 (×21): qty 4.51

## 2015-12-05 MED ORDER — DEXTROSE 50 % IV SOLN
INTRAVENOUS | Status: AC
  Administered 2015-12-05: 50
  Filled 2015-12-05: qty 50

## 2015-12-05 MED ORDER — SODIUM CHLORIDE 0.9% FLUSH: 10.0000 mL | INTRAVENOUS | Status: DC | PRN

## 2015-12-05 MED ORDER — COLLAGENASE 250 UNIT/GM EX OINT
TOPICAL_OINTMENT | Freq: Every day | CUTANEOUS | Status: DC
  Filled 2015-12-05 (×2): qty 30

## 2015-12-05 MED ORDER — VITAL 1.5 CAL PO LIQD
1000.0000 mL | ORAL | Status: DC
  Administered 2015-12-05: 1000 mL
  Filled 2015-12-05 (×7): qty 1000

## 2015-12-05 MED ORDER — METRONIDAZOLE IN NACL 5-0.79 MG/ML-% IV SOLN
500.0000 mg | Freq: Three times a day (TID) | INTRAVENOUS | Status: DC
  Administered 2015-12-05 – 2015-12-16 (×34): 500 mg via INTRAVENOUS
  Filled 2015-12-05 (×38): qty 100

## 2015-12-05 MED ORDER — SODIUM CHLORIDE 0.9% FLUSH
10.0000 mL | Freq: Two times a day (BID) | INTRAVENOUS | Status: DC
  Administered 2015-12-06: 30 mL
  Administered 2015-12-06: 10 mL
  Administered 2015-12-06 – 2015-12-07 (×2): 30 mL
  Administered 2015-12-07: 10 mL

## 2015-12-05 MED ORDER — COLLAGENASE 250 UNIT/GM EX OINT
TOPICAL_OINTMENT | Freq: Every day | CUTANEOUS | Status: DC
  Administered 2015-12-05 – 2015-12-08 (×4): via TOPICAL
  Administered 2015-12-09: 1 via TOPICAL
  Administered 2015-12-10 – 2015-12-14 (×4): via TOPICAL
  Administered 2015-12-14 – 2015-12-16 (×3): 1 via TOPICAL
  Administered 2015-12-17: 19:00:00 via TOPICAL
  Filled 2015-12-05 (×3): qty 30

## 2015-12-05 MED ORDER — DEXTROSE 5 % IV SOLN
0.9400 g | Freq: Two times a day (BID) | INTRAVENOUS | Status: DC
  Filled 2015-12-05 (×2): qty 4.51

## 2015-12-05 MED ORDER — ANTISEPTIC ORAL RINSE SOLUTION (CORINZ)
7.0000 mL | Freq: Four times a day (QID) | OROMUCOSAL | Status: DC
  Administered 2015-12-05 – 2015-12-18 (×51): 7 mL via OROMUCOSAL

## 2015-12-05 NOTE — Progress Notes (Signed)
Initial Nutrition Assessment  DOCUMENTATION CODES:   Severe malnutrition in context of chronic illness  INTERVENTION:  -Tube feeding via PEG -Initiate Vital 1.5 @ 25 ml/hr and advance by 10 ml every 12 hours to goal rate of 40 ml/hr with prostat daily to provide 1540 kcals and 80 grams of protein (meets 100% needs).  -Pt is severely malnourished; monitor mag, phos, K for 3 days.    NUTRITION DIAGNOSIS:   Inadequate oral intake related to inability to eat as evidenced by NPO status.  GOAL:   Patient will meet greater than or equal to 90% of their needs  MONITOR:   Vent status, Labs, Weight trends, Skin, I & O's, TF tolerance  REASON FOR ASSESSMENT:   Consult Enteral/tube feeding initiation and management  ASSESSMENT:   70 year old woman transferred from [facility] due to being unresponsive for 24 hours. She was noted to be hypotensive with a hemoglobin of 6.1, chest x-ray showed a left lower lobe airspace disease.   PEG tube.  Starting TF today if no evidence of GI bleed. Initiate tube feedings at provided rate.   Temp (24hrs), Avg:98.1 F (36.7 C), Min:96.5 F (35.8 C), Max:102.7 F (39.3 C) VM: 10.2 ml  NFPE: Severe depletion of muscle mass and fat mass, no edema.  Labs reviewed; k 5.3, Cl 90, BUN 164, Ca 8.1, GFR 30, CBGs 35-258.  Meds reviewed.   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  Estimated Nutritional Needs:   Kcal:  1523 kcals  Protein:  80 g (1.8 g/kg)  Fluid:  1.5 L  EDUCATION NEEDS:   No education needs identified at this time  Beryle QuantMeredith Sammie Schermerhorn, MS [facility] Dietetic Intern Pager [phone]

## 2015-12-05 NOTE — Care Management Note (Signed)
Case Management Note  Patient Details  Name: Kristen Arnold MRN: [id number] Date of Birth: 08/15/1945  Subjective/Objective:   Pt admitted with sepsis                 Action/Plan:  [facility].  [facility] and stayed there from 10/01/15- 11/10/15 and then transitioned to the SNF side of facility.  CM spoke with [facility] and was informed that pt does not have any available LTACH days and therefore will need to discharge back to SNF. Pt has  chronic trach / J tube.   CSW consulted.   Expected Discharge Date:                  Expected Discharge Plan:  Skilled Nursing Facility  In-House Referral:  Clinical Social Work  Discharge planning Services  CM Consult  Post Acute Care Choice:    Choice offered to:     DME Arranged:    DME Agency:     HH Arranged:    HH Agency:     Status of Service:  In process, will continue to follow  Medicare Important Message Given:    Date Medicare IM Given:    Medicare IM give by:    Date Additional Medicare IM Given:    Additional Medicare Important Message give by:     If discussed at Long Length of Stay Meetings, dates discussed:    Additional Comments:  Kristen Arnold, Kristen Buysse S, RN 12/05/2015, 10:06 AM

## 2015-12-05 NOTE — Progress Notes (Signed)
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 [facility], explained the need for STRICT CONTACT precautions and upon discharge or transfer room should receive UV disinfection. Sticky note left for the treatment team also. Fredric DineMargaret Chi Woodham RN BSN Infection Prevention

## 2015-12-05 NOTE — Consult Note (Signed)
[facility] for Infectious Disease  Date of Admission:  12/04/2015  Date of Consult:  12/05/2015  Reason for Consult: Sepsis, Polymicrobial BCx Referring Physician: CHAMP  Impression/Recommendation Polymicrobial BCx- pseudomonas, klebsiella (KPC), enterococcus (non-VRE).  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  Will repeat C dif, start PO vanco (her markedly elevated WBC would be consistent with this) Needs Palliative Care eval for goals of care  Comment- She is very ill and has been chronically ill.  Palliative care eval please  Thank you so much for this interesting consult,   Bobby Rumpf (pager) [phone] [email or web address]  Camie Hauss is an 70 y.o. female.  HPI: 70 yo F with hx of SBO and hemocolectomy ([phone]) complicated by failure to wean, PEG placement, HCAP (pseudomonas), CNS bacteremia and C diff.  She is [facility] to [facility] 5[facility]4 from [facility] after being unresposnsive for 24h. She was hypotensive requiring levophed, temp 101, WBC 39.4, and anemic (with Hgb 6.1). She was felt to have LLL pna, started on cipro/vanco/zosyn. Currently she is on vent, awake and uncomfortable with wound exam.   Past Medical History  Diagnosis Date  . Bowel obstruction ([place])     colectomy 06/2015  . Anemia   . COPD (chronic obstructive pulmonary disease) ([place])   . Dementia   . Acute on chronic respiratory failure ([place])   . Anxiety   . Depression   . Diabetes mellitus without complication ([place])   . DVT (deep venous thrombosis) ([place])   . Recurrent Clostridium difficile diarrhea   . History of Pseudomonas pneumonia   . Severe protein-calorie malnutrition ([place])   . Chronic pain     Past Surgical History  Procedure Laterality Date  . Tracheostomy  07/14/2015   [place] tube placement  07/14/2015    [place] resection      right hemicolectomy, [place] Right 06/24/2015  . Bronchoscopies    . Ventral hernia repair  ????  . Incisional hernia repair  06/24/2015    with colectomy     Allergies  Allergen Reactions  . Codeine     Unknown reaction, listed on MAR    Medications:  Scheduled: . sodium chloride   Intravenous Once  . antiseptic oral rinse  7 mL Mouth Rinse QID  . ceftazidime avibactam (AVYCAZ) IVPB  0.94 g Intravenous Q12H  . chlorhexidine gluconate (SAGE KIT)  15 mL Mouth Rinse BID  . collagenase   Topical Daily  . heparin  5,000 Units Subcutaneous Q8H  . insulin aspart  0-9 Units Subcutaneous Q4H  . memantine  5 mg Per Tube BID  . pantoprazole (PROTONIX) IV  40 mg Intravenous QHS  . vancomycin  500 mg Intravenous Q24H    Abtx:  Anti-infectives    Start     Dose/Rate Route Frequency Ordered Stop   12/06/15 2200  levofloxacin (LEVAQUIN) IVPB 500 mg  Status:  Discontinued     500 mg 100 mL/hr over 60 Minutes Intravenous Every 48 hours 12/04/15 2229 12/04/15 2229   12/05/15 2000  vancomycin (VANCOCIN) 500 mg in sodium chloride 0.9 % 100 mL IVPB     500 mg 100 mL/hr over 60 Minutes Intravenous Every 24 hours 12/04/15 2229  12/05/15 1100  ceftazidime-avibactam (AVYCAZ) 0.94 g in dextrose 5 % 50 mL IVPB     0.94 g 25 mL/hr over 2 Hours Intravenous Every 12 hours 12/05/15 1008     12/05/15 1015  ceftazidime-avibactam (AVYCAZ) 0.94 g in dextrose 5 % 50 mL IVPB  Status:  Discontinued     0.94 g 25 mL/hr over 2 Hours Intravenous Every 12 hours 12/05/15 1008 12/05/15 1008   12/05/15 0400  piperacillin-tazobactam (ZOSYN) IVPB 3.375 g  Status:  Discontinued     3.375 g 12.5 mL/hr over 240 Minutes Intravenous Every 8 hours 12/04/15 2229 12/05/15 1006   12/04/15 2300  ciprofloxacin (CIPRO) IVPB 200 mg  Status:  Discontinued     200 mg 100 mL/hr over 60 Minutes Intravenous  Every 24 hours 12/04/15 2235 12/05/15 1006   12/04/15 2230  levofloxacin (LEVAQUIN) IVPB 750 mg  Status:  Discontinued     750 mg 100 mL/hr over 90 Minutes Intravenous  Once 12/04/15 2229 12/04/15 2229   12/04/15 1930  vancomycin (VANCOCIN) IVPB 1000 mg/200 mL premix     1,000 mg 200 mL/hr over 60 Minutes Intravenous  Once 12/04/15 1924 12/04/15 2041   12/04/15 1930  piperacillin-tazobactam (ZOSYN) IVPB 3.375 g     3.375 g 100 mL/hr over 30 Minutes Intravenous  Once 12/04/15 1924 12/04/15 1958      Total days of antibiotics: 1          Social History:  reports that she has quit smoking. Her smoking use included Cigarettes. She has never used smokeless tobacco. She reports that she does not drink alcohol or use illicit drugs.  History reviewed. No pertinent family history.  General ROS: on vent, unobtainable  Blood pressure 116/59, pulse 78, temperature 96 F (35.6 C), temperature source Axillary, resp. rate 20, height 5' (1.524 m), weight 45.3 kg (99 lb 13.9 oz), SpO2 90 %. General appearance: cooperative and severe distress Eyes: negative findings: EOMI Throat: dry, ? thrush Neck: no adenopathy and supple, symmetrical, trachea midline Lungs: rhonchi bilaterally Heart: tachycardia Abdomen: normal findings: soft, non-tender and abnormal findings:  hypoactive bowel sounds Extremities: wasting Skin: stage 4 scaral decubitus, bone present, foul smelling. tender. adjacent separate wound with skin breakdown, eschar.    Results for orders placed or performed during the hospital encounter of 12/04/15 (from the past 48 hour(s))  I-Stat arterial blood gas, ED     Status: Abnormal   Collection Time: 12/04/15  7:12 PM  Result Value Ref Range   pH, Arterial 7.356 7.350 - 7.450   pCO2 arterial 82.7 (HH) 35.0 - 45.0 mmHg   pO2, Arterial 72.0 (L) 80.0 - 100.0 mmHg   Bicarbonate 45.0 (H) 20.0 - 24.0 mEq/L   TCO2 47 0 - 100 mmol/L   O2 Saturation 88.0 %   Acid-Base Excess 18.0 (H) 0.0 -  2.0 mmol/L   Patient temperature 104.0 F    Sample type ARTERIAL    Comment NOTIFIED PHYSICIAN   Comprehensive metabolic panel     Status: Abnormal   Collection Time: 12/04/15  7:28 PM  Result Value Ref Range   Sodium 141 135 - 145 mmol/L   Potassium 5.1 3.5 - 5.1 mmol/L   Chloride 89 (L) 101 - 111 mmol/L   CO2 38 (H) 22 - 32 mmol/L   Glucose, Bld 207 (H) 65 - 99 mg/dL   BUN 410 (H) 6 - 20 mg/dL   Creatinine, Ser 3.01 (H) 0.44 - 1.00 mg/dL   Calcium 9.1 8.9 -  10.3 mg/dL   Total Protein 6.3 (L) 6.5 - 8.1 g/dL   Albumin 1.2 (L) 3.5 - 5.0 g/dL   AST 26 15 - 41 U/L   ALT 14 14 - 54 U/L   Alkaline Phosphatase 240 (H) 38 - 126 U/L   Total Bilirubin 0.5 0.3 - 1.2 mg/dL   GFR calc non Af Amer 34 (L) >60 mL/min   GFR calc Af Amer 39 (L) >60 mL/min    Comment: (NOTE) The eGFR has been calculated using the CKD EPI equation. This calculation has not been validated in all clinical situations. eGFR's persistently <60 mL/min signify possible Chronic Kidney Disease.    Anion gap 14 5 - 15  CBC WITH DIFFERENTIAL     Status: Abnormal   Collection Time: 12/04/15  7:28 PM  Result Value Ref Range   WBC 39.4 (H) 4.0 - 10.5 K/uL   RBC 2.38 (L) 3.87 - 5.11 MIL/uL   Hemoglobin 6.1 (LL) 12.0 - 15.0 g/dL    Comment: REPEATED TO VERIFY CRITICAL RESULT CALLED TO, READ BACK BY AND VERIFIED WITH: Z LEWIS,RN 2043 12/04/15 D BRADLEY    HCT 20.8 (L) 36.0 - 46.0 %   MCV 87.4 78.0 - 100.0 fL   MCH 25.6 (L) 26.0 - 34.0 pg   MCHC 29.3 (L) 30.0 - 36.0 g/dL   RDW 19.6 (H) 11.5 - 15.5 %   Platelets 729 (H) 150 - 400 K/uL   Neutro Abs 36.3 (H) 1.7 - 7.7 K/uL   Lymphs Abs 0.6 (L) 0.7 - 4.0 K/uL   Monocytes Absolute 0.5 0.1 - 1.0 K/uL   Eosinophils Absolute 0.0 0.0 - 0.7 K/uL   Basophils Absolute 0.0 0.0 - 0.1 K/uL   Neutrophils Relative % 97 %   Lymphocytes Relative 2 %   Monocytes Relative 1 %   Eosinophils Relative 0 %   Basophils Relative 0 %   Other 0 %  Blood Culture (routine x 2)     Status: None  (Preliminary result)   Collection Time: 12/04/15  7:28 PM  Result Value Ref Range   Specimen Description BLOOD PICC LINE    Special Requests BOTTLES DRAWN AEROBIC AND ANAEROBIC 5CC    Culture  Setup Time      Organism ID to follow GRAM POSITIVE COCCI [place] IN BOTH AEROBIC AND ANAEROBIC BOTTLES CRITICAL RESULT CALLED TO, READ BACK BY AND VERIFIED WITH: CLamont Snowball D AT [place] ON [id number] BY S. YARBROUGH    Culture TOO YOUNG TO READ    Report Status PENDING   Blood Culture (routine x 2)     Status: None (Preliminary result)   Collection Time: 12/04/15  7:28 PM  Result Value Ref Range   Specimen Description BLOOD PICC LINE    Special Requests BOTTLES DRAWN AEROBIC AND ANAEROBIC 5CC    Culture  Setup Time      GRAM POSITIVE COCCI GRAM NEGATIVE RODS IN BOTH AEROBIC AND ANAEROBIC BOTTLES CRITICAL RESULT CALLED TO, READ BACK BY AND VERIFIED WITH: CNicole [facility], PHARM D AT [place] ON [id number] BY Rhea Bleacher    Culture TOO YOUNG TO READ    Report Status PENDING   Blood Culture ID Panel (Reflexed)     Status: Abnormal   Collection Time: 12/04/15  7:28 PM  Result Value Ref Range   Enterococcus species DETECTED (A) NOT DETECTED    Comment: CRITICAL RESULT CALLED TO, READ BACK BY AND VERIFIED WITH: CNicole [facility], [place] D AT [place] ON [id number]  BY S. YARBROUGH    Vancomycin resistance NOT DETECTED NOT DETECTED   Listeria monocytogenes NOT DETECTED NOT DETECTED   Staphylococcus species NOT DETECTED NOT DETECTED   Staphylococcus aureus NOT DETECTED NOT DETECTED   Methicillin resistance NOT DETECTED NOT DETECTED   Streptococcus species NOT DETECTED NOT DETECTED   Streptococcus agalactiae NOT DETECTED NOT DETECTED   Streptococcus pneumoniae NOT DETECTED NOT DETECTED   Streptococcus pyogenes NOT DETECTED NOT DETECTED   Acinetobacter baumannii NOT DETECTED NOT DETECTED   Enterobacteriaceae species NOT DETECTED NOT DETECTED   Enterobacter cloacae complex NOT DETECTED NOT DETECTED   Escherichia  coli NOT DETECTED NOT DETECTED   Klebsiella oxytoca NOT DETECTED NOT DETECTED   Klebsiella pneumoniae DETECTED (A) NOT DETECTED    Comment: CRITICAL RESULT CALLED TO, READ BACK BY AND VERIFIED WITH: C. STEWART,PHARM D AT 0855 ON [id number] BY S. YARBROUGH    Proteus species NOT DETECTED NOT DETECTED   Serratia marcescens NOT DETECTED NOT DETECTED   Carbapenem resistance DETECTED (A) NOT DETECTED    Comment: CRITICAL RESULT CALLED TO, READ BACK BY AND VERIFIED WITH: C. STEWART,PHARM D AT 0855 ON [id number] BY S. YARBROUGH AND NOTIFIED NATALIE DAVIS IN INFECTION CONTROL AT 0900 ON [id number] BY S. YARBROUGH    Haemophilus influenzae NOT DETECTED NOT DETECTED   Neisseria meningitidis NOT DETECTED NOT DETECTED   Pseudomonas aeruginosa DETECTED (A) NOT DETECTED    Comment: CRITICAL RESULT CALLED TO, READ BACK BY AND VERIFIED WITH: CLamont Snowball D AT 0855 ON [id number] BY S. YARBROUGH    Candida albicans NOT DETECTED NOT DETECTED   Candida glabrata NOT DETECTED NOT DETECTED   Candida krusei NOT DETECTED NOT DETECTED   Candida parapsilosis NOT DETECTED NOT DETECTED   Candida tropicalis NOT DETECTED NOT DETECTED  Pathologist smear review     Status: None   Collection Time: 12/04/15  7:28 PM  Result Value Ref Range   Path Review Neutrophilia and left shift     Comment: Thrombocytosis Favor reactive process Hypochromic anemia Reviewed by Lennox Solders. Lyndon Code, M.D. 12/05/15   Urinalysis, Routine w reflex microscopic (not at [facility])     Status: Abnormal   Collection Time: 12/04/15  7:48 PM  Result Value Ref Range   Color, Urine YELLOW YELLOW   APPearance CLOUDY (A) CLEAR   Specific Gravity, Urine 1.017 1.005 - 1.030   pH 5.5 5.0 - 8.0   Glucose, UA NEGATIVE NEGATIVE mg/dL   Hgb urine dipstick LARGE (A) NEGATIVE   Bilirubin Urine NEGATIVE NEGATIVE   Ketones, ur NEGATIVE NEGATIVE mg/dL   Protein, ur 30 (A) NEGATIVE mg/dL   Nitrite NEGATIVE NEGATIVE   Leukocytes, UA MODERATE (A) NEGATIVE  Urine culture      Status: None (Preliminary result)   Collection Time: 12/04/15  7:48 PM  Result Value Ref Range   Specimen Description URINE, CATHETERIZED    Special Requests NONE    Culture NO GROWTH < 24 HOURS    Report Status PENDING   Urine microscopic-add on     Status: Abnormal   Collection Time: 12/04/15  7:48 PM  Result Value Ref Range   Squamous Epithelial / LPF 0-5 (A) NONE SEEN   WBC, UA 6-30 0 - 5 WBC/hpf   RBC / HPF 6-30 0 - 5 RBC/hpf   Bacteria, UA FEW (A) NONE SEEN   Casts HYALINE CASTS (A) NEGATIVE  I-Stat CG4 Lactic Acid, ED  (not at  [place])     Status: Abnormal   Collection Time: 12/04/15  8:03 PM  Result Value Ref Range   Lactic Acid, Venous 2.53 (HH) 0.5 - 2.0 mmol/L   Comment NOTIFIED PHYSICIAN   Type and screen [place]     Status: None (Preliminary result)   Collection Time: 12/04/15  9:25 PM  Result Value Ref Range   ABO/RH(D) O NEG    Antibody Screen NEG    Sample Expiration 12/07/2015    Unit Number [id number]    Blood Component Type RBC LR PHER2    Unit division 00    Status of Unit ISSUED    Transfusion Status OK TO TRANSFUSE    Crossmatch Result Compatible    Unit Number [id number]    Blood Component Type RBC LR PHER1    Unit division 00    Status of Unit ISSUED    Transfusion Status OK TO TRANSFUSE    Crossmatch Result Compatible   Prepare RBC     Status: None   Collection Time: 12/04/15  9:35 PM  Result Value Ref Range   Order Confirmation ORDER PROCESSED BY BLOOD BANK   ABO/Rh     Status: None   Collection Time: 12/04/15  9:35 PM  Result Value Ref Range   ABO/RH(D) O NEG   POC occult blood, ED     Status: None   Collection Time: 12/04/15 10:02 PM  Result Value Ref Range   Fecal Occult Bld NEGATIVE NEGATIVE  Lactic acid, plasma     Status: Abnormal   Collection Time: 12/04/15 10:30 PM  Result Value Ref Range   Lactic Acid, Venous 3.4 (HH) 0.5 - 2.0 mmol/L    Comment: CRITICAL RESULT CALLED TO, READ BACK BY AND VERIFIED  WITH: CITTY,A RN 12/04/2015 2328 JORDANS   MRSA PCR Screening     Status: Abnormal   Collection Time: 12/05/15 12:04 AM  Result Value Ref Range   MRSA by PCR POSITIVE (A) NEGATIVE    Comment:        The GeneXpert MRSA Assay (FDA approved for NASAL specimens only), is one component of a comprehensive MRSA colonization surveillance program. It is not intended to diagnose MRSA infection nor to guide or monitor treatment for MRSA infections. RESULT CALLED TO, READ BACK BY AND VERIFIED WITH: Filbert Berthold RN 10:40 12/05/15 ([email or web address])   Glucose, capillary     Status: Abnormal   Collection Time: 12/05/15 12:16 AM  Result Value Ref Range   Glucose-Capillary 35 (LL) 65 - 99 mg/dL   Comment 1 Capillary Specimen    Comment 2 Notify RN   Glucose, capillary     Status: Abnormal   Collection Time: 12/05/15 12:43 AM  Result Value Ref Range   Glucose-Capillary 142 (H) 65 - 99 mg/dL   Comment 1 Capillary Specimen    Comment 2 Notify RN   Blood gas, arterial     Status: Abnormal   Collection Time: 12/05/15  3:25 AM  Result Value Ref Range   FIO2 1.00    Delivery systems VENTILATOR    Mode PRESSURE REGULATED VOLUME CONTROL    VT 480 mL   LHR 16 resp/min   Peep/cpap 5.0 cm H20   pH, Arterial 7.341 (L) 7.350 - 7.450   pCO2 arterial 69.6 (HH) 35.0 - 45.0 mmHg    Comment: CRITICAL RESULT CALLED TO, READ BACK BY AND VERIFIED WITH: KIM CLIFTON,RRT, BY TIM SNIDER RRT,RCP AT 0335 ON 12/05/15    pO2, Arterial 178 (H) 80.0 - 100.0 mmHg   Bicarbonate 36.6 (H) 20.0 - 24.0  mEq/L   TCO2 38.7 0 - 100 mmol/L   Acid-Base Excess 10.6 (H) 0.0 - 2.0 mmol/L   O2 Saturation 99.2 %   Patient temperature 98.6    Collection site RIGHT RADIAL    Drawn by [phone]    Sample type ARTERIAL DRAW    Allens test (pass/fail) PASS PASS  CBC     Status: Abnormal   Collection Time: 12/05/15  4:00 AM  Result Value Ref Range   WBC 59.1 (HH) 4.0 - 10.5 K/uL    Comment: REPEATED TO VERIFY CRITICAL RESULT CALLED TO, READ BACK  BY AND VERIFIED WITH: Eben Burow [id number] WILDERK    RBC 2.63 (L) 3.87 - 5.11 MIL/uL   Hemoglobin 7.3 (L) 12.0 - 15.0 g/dL   HCT 23.6 (L) 36.0 - 46.0 %   MCV 89.7 78.0 - 100.0 fL   MCH 27.8 26.0 - 34.0 pg   MCHC 30.9 30.0 - 36.0 g/dL   RDW 17.7 (H) 11.5 - 15.5 %   Platelets 653 (H) 150 - 400 K/uL  Basic metabolic panel     Status: Abnormal   Collection Time: 12/05/15  4:00 AM  Result Value Ref Range   Sodium 139 135 - 145 mmol/L   Potassium 5.3 (H) 3.5 - 5.1 mmol/L   Chloride 90 (L) 101 - 111 mmol/L   CO2 34 (H) 22 - 32 mmol/L   Glucose, Bld 254 (H) 65 - 99 mg/dL   BUN 164 (H) 6 - 20 mg/dL   Creatinine, Ser 1.66 (H) 0.44 - 1.00 mg/dL   Calcium 8.1 (L) 8.9 - 10.3 mg/dL   GFR calc non Af Amer 30 (L) >60 mL/min   GFR calc Af Amer 35 (L) >60 mL/min    Comment: (NOTE) The eGFR has been calculated using the CKD EPI equation. This calculation has not been validated in all clinical situations. eGFR's persistently <60 mL/min signify possible Chronic Kidney Disease.    Anion gap 15 5 - 15  Magnesium     Status: None   Collection Time: 12/05/15  4:00 AM  Result Value Ref Range   Magnesium 2.3 1.7 - 2.4 mg/dL  Phosphorus     Status: None   Collection Time: 12/05/15  4:00 AM  Result Value Ref Range   Phosphorus 4.6 2.5 - 4.6 mg/dL  Troponin I     Status: Abnormal   Collection Time: 12/05/15  4:00 AM  Result Value Ref Range   Troponin I 0.27 (H) <0.031 ng/mL    Comment:        PERSISTENTLY INCREASED TROPONIN VALUES IN THE RANGE OF 0.04-0.49 ng/mL CAN BE SEEN IN:       -UNSTABLE ANGINA       -CONGESTIVE HEART FAILURE       -MYOCARDITIS       -CHEST TRAUMA       -ARRYHTHMIAS       -LATE PRESENTING MYOCARDIAL INFARCTION       -COPD   CLINICAL FOLLOW-UP RECOMMENDED.   Glucose, capillary     Status: Abnormal   Collection Time: 12/05/15  4:00 AM  Result Value Ref Range   Glucose-Capillary 258 (H) 65 - 99 mg/dL   Comment 1 Venous Specimen   Cortisol     Status: None    Collection Time: 12/05/15  4:00 AM  Result Value Ref Range   Cortisol, Plasma 22.5 ug/dL    Comment: (NOTE) AM    6.7 - 22.6 ug/dL PM   <10.0  ug/dL   Glucose, capillary     Status: Abnormal   Collection Time: 12/05/15  8:14 AM  Result Value Ref Range   Glucose-Capillary 238 (H) 65 - 99 mg/dL   Comment 1 Notify RN   Troponin I     Status: Abnormal   Collection Time: 12/05/15 11:10 AM  Result Value Ref Range   Troponin I 0.18 (H) <0.031 ng/mL    Comment:        PERSISTENTLY INCREASED TROPONIN VALUES IN THE RANGE OF 0.04-0.49 ng/mL CAN BE SEEN IN:       -UNSTABLE ANGINA       -CONGESTIVE HEART FAILURE       -MYOCARDITIS       -CHEST TRAUMA       -ARRYHTHMIAS       -LATE PRESENTING MYOCARDIAL INFARCTION       -COPD   CLINICAL FOLLOW-UP RECOMMENDED.   Lactic acid, plasma     Status: Abnormal   Collection Time: 12/05/15 11:10 AM  Result Value Ref Range   Lactic Acid, Venous 2.1 (HH) 0.5 - 2.0 mmol/L    Comment: CRITICAL RESULT CALLED TO, READ BACK BY AND VERIFIED WITH: P PARKER,RN [id number] WILDERK   Glucose, capillary     Status: Abnormal   Collection Time: 12/05/15 12:17 PM  Result Value Ref Range   Glucose-Capillary 121 (H) 65 - 99 mg/dL   Comment 1 Notify RN       Component Value Date/Time   SDES URINE, CATHETERIZED 12/04/2015 1948   SPECREQUEST NONE 12/04/2015 1948   CULT NO GROWTH < 24 HOURS 12/04/2015 1948   REPTSTATUS PENDING 12/04/2015 1948   Dg Chest Port 1 View  12/05/2015  CLINICAL DATA:  Central line placement EXAM: PORTABLE CHEST 1 VIEW COMPARISON:  12/04/2015 FINDINGS: Tracheostomy. Left subclavian venous catheter with tip over the mid SVC likely at the junction of the brachiocephalic vein. New right jugular venous catheter with tip over the low SVC. No pneumothorax. Normal heart size and pulmonary vascularity. Small left pleural effusion with basilar atelectasis or infiltration. Focal airspace disease in the left upper lung. Followup after resolution  of acute process is recommended to exclude underlying nodule. Probable emphysematous changes in the lungs. IMPRESSION: Appliances appear in satisfactory position. Left pleural effusion with basilar atelectasis or infiltration. Focal nodular infiltration in the left upper lung. Follow-up after resolution of acute process suggested. Electronically Signed   By: Lucienne Capers M.D.   On: 12/05/2015 02:16   Dg Chest Port 1 View  12/04/2015  CLINICAL DATA:  Altered mental status.  Respiratory failure. EXAM: PORTABLE CHEST 1 VIEW COMPARISON:  09/26/2015 FINDINGS: Tracheostomy is centered on the tracheal air column. Left upper extremity PICC line extends to the expected location of the SVC just above the azygos vein junction. There is consolidation and effusion in the left base. This may represent pneumonia with parapneumonic effusion. Right lung is clear. Pulmonary vasculature is normal. Hilar and mediastinal contours are unremarkable. IMPRESSION: Left base consolidation and effusion. Suspect pneumonia. Followup PA and lateral chest X-ray is recommended in 3-4 weeks following trial of antibiotic therapy to ensure resolution and exclude underlying malignancy. Electronically Signed   By: Andreas Newport M.D.   On: 12/04/2015 19:35   Recent Results (from the past 240 hour(s))  Blood Culture (routine x 2)     Status: None (Preliminary result)   Collection Time: 12/04/15  7:28 PM  Result Value Ref Range Status   Specimen Description BLOOD PICC LINE  Final   Special Requests BOTTLES DRAWN AEROBIC AND ANAEROBIC 5CC  Final   Culture  Setup Time   Final    Organism ID to follow GRAM POSITIVE COCCI GRAM NEGATIVE RODS IN BOTH AEROBIC AND ANAEROBIC BOTTLES CRITICAL RESULT CALLED TO, READ BACK BY AND VERIFIED WITH: C. Lamont Snowball D AT [place] ON [id number] BY S. YARBROUGH    Culture TOO YOUNG TO READ  Final   Report Status PENDING  Incomplete  Blood Culture (routine x 2)     Status: None (Preliminary result)    Collection Time: 12/04/15  7:28 PM  Result Value Ref Range Status   Specimen Description BLOOD PICC LINE  Final   Special Requests BOTTLES DRAWN AEROBIC AND ANAEROBIC 5CC  Final   Culture  Setup Time   Final    GRAM POSITIVE COCCI GRAM NEGATIVE RODS IN BOTH AEROBIC AND ANAEROBIC BOTTLES CRITICAL RESULT CALLED TO, READ BACK BY AND VERIFIED WITH: CLamont Snowball D AT [place] ON [id number] BY Rhea Bleacher    Culture TOO YOUNG TO READ  Final   Report Status PENDING  Incomplete  Blood Culture ID Panel (Reflexed)     Status: Abnormal   Collection Time: 12/04/15  7:28 PM  Result Value Ref Range Status   Enterococcus species DETECTED (A) NOT DETECTED Final    Comment: CRITICAL RESULT CALLED TO, READ BACK BY AND VERIFIED WITH: C. STEWART, PHARM D AT 0855 ON [id number] BY S. YARBROUGH    Vancomycin resistance NOT DETECTED NOT DETECTED Final   Listeria monocytogenes NOT DETECTED NOT DETECTED Final   Staphylococcus species NOT DETECTED NOT DETECTED Final   Staphylococcus aureus NOT DETECTED NOT DETECTED Final   Methicillin resistance NOT DETECTED NOT DETECTED Final   Streptococcus species NOT DETECTED NOT DETECTED Final   Streptococcus agalactiae NOT DETECTED NOT DETECTED Final   Streptococcus pneumoniae NOT DETECTED NOT DETECTED Final   Streptococcus pyogenes NOT DETECTED NOT DETECTED Final   Acinetobacter baumannii NOT DETECTED NOT DETECTED Final   Enterobacteriaceae species NOT DETECTED NOT DETECTED Final   Enterobacter cloacae complex NOT DETECTED NOT DETECTED Final   Escherichia coli NOT DETECTED NOT DETECTED Final   Klebsiella oxytoca NOT DETECTED NOT DETECTED Final   Klebsiella pneumoniae DETECTED (A) NOT DETECTED Final    Comment: CRITICAL RESULT CALLED TO, READ BACK BY AND VERIFIED WITH: C. STEWART,PHARM D AT 0855 ON [id number] BY S. YARBROUGH    Proteus species NOT DETECTED NOT DETECTED Final   Serratia marcescens NOT DETECTED NOT DETECTED Final   Carbapenem resistance DETECTED (A) NOT  DETECTED Final    Comment: CRITICAL RESULT CALLED TO, READ BACK BY AND VERIFIED WITH: C. STEWART,PHARM D AT 0855 ON [id number] BY S. YARBROUGH AND NOTIFIED NATALIE DAVIS IN INFECTION CONTROL AT 0900 ON [id number] BY S. YARBROUGH    Haemophilus influenzae NOT DETECTED NOT DETECTED Final   Neisseria meningitidis NOT DETECTED NOT DETECTED Final   Pseudomonas aeruginosa DETECTED (A) NOT DETECTED Final    Comment: CRITICAL RESULT CALLED TO, READ BACK BY AND VERIFIED WITH: CLamont Snowball D AT 0855 ON [id number] BY S. YARBROUGH    Candida albicans NOT DETECTED NOT DETECTED Final   Candida glabrata NOT DETECTED NOT DETECTED Final   Candida krusei NOT DETECTED NOT DETECTED Final   Candida parapsilosis NOT DETECTED NOT DETECTED Final   Candida tropicalis NOT DETECTED NOT DETECTED Final  Urine culture     Status: None (Preliminary result)   Collection Time: 12/04/15  7:48 PM  Result Value Ref  Range Status   Specimen Description URINE, CATHETERIZED  Final   Special Requests NONE  Final   Culture NO GROWTH < 24 HOURS  Final   Report Status PENDING  Incomplete  MRSA PCR Screening     Status: Abnormal   Collection Time: 12/05/15 12:04 AM  Result Value Ref Range Status   MRSA by PCR POSITIVE (A) NEGATIVE Final    Comment:        The GeneXpert MRSA Assay (FDA approved for NASAL specimens only), is one component of a comprehensive MRSA colonization surveillance program. It is not intended to diagnose MRSA infection nor to guide or monitor treatment for MRSA infections. RESULT CALLED TO, READ BACK BY AND VERIFIED WITH: Filbert Berthold RN 10:40 12/05/15 ([email or web address])       12/05/2015, 3:08 PM     LOS: 1 day    Records and images were personally reviewed where available.

## 2015-12-05 NOTE — Progress Notes (Signed)
PULMONARY / CRITICAL CARE MEDICINE   Name: Kristen Arnold MRN: [id number] DOB: 1946-01-25    ADMISSION DATE:  12/04/2015 CONSULTATION DATE:  12/04/15  REFERRING MD:  EDP  CHIEF COMPLAINT:  AMS  SUBJECTIVE:  RT reports weaning PEEP/FiO2 - remains on full support.  Levophed at 5 mcg, weaning.    VITAL SIGNS: BP 102/50 mmHg  Pulse 73  Temp(Src) 96.9 F (36.1 C) (Oral)  Resp 20  Ht 5' (1.524 m)  Wt 99 lb 13.9 oz (45.3 kg)  BMI 19.50 kg/m2  SpO2 100%  HEMODYNAMICS:    VENTILATOR SETTINGS: Vent Mode:  [-] PRVC FiO2 (%):  [50 %-100 %] 50 % Set Rate:  [16 bmp] 16 bmp Vt Set:  [480 mL] 480 mL PEEP:  [5 cmH20] 5 cmH20 Plateau Pressure:  [10 cmH20-25 cmH20] 10 cmH20  INTAKE / OUTPUT: I/O last 3 completed shifts: In: 5197.9 [I.V.:4373.4; Blood:674.5; IV Piggyback:150] Out: 352 [Urine:350; Stool:2]   PHYSICAL EXAMINATION: General: Chronically ill appearing elderly female, cachectic, in NAD. Neuro: Non-responsive. Does not respond to pain. HEENT: [place]/AT. PERRL, sclerae anicteric.  #6 XLT distal trach in place and site is C/D/I. Dry / crusty MM. Cardiovascular: RRR, no M/R/G.  Lungs: Respirations even & unlabored.  CTA bilaterally, No W/R/R. Abdomen: BS x 4, soft, NT/ND. J tube in place. Musculoskeletal: No gross deformities, no edema.  Skin: Intact, warm, no rashes.       LABS:  BMET  Recent Labs Lab 12/04/15 1928 12/05/15 0400  NA 141 139  K 5.1 5.3*  CL 89* 90*  CO2 38* 34*  BUN 169* 164*  CREATININE 1.51* 1.66*  GLUCOSE 207* 254*    Electrolytes  Recent Labs Lab 12/04/15 1928 12/05/15 0400  CALCIUM 9.1 8.1*  MG  --  2.3  PHOS  --  4.6    CBC  Recent Labs Lab 12/04/15 1928 12/05/15 0400  WBC 39.4* 59.1*  HGB 6.1* 7.3*  HCT 20.8* 23.6*  PLT 729* 653*    Coag's No results for input(s): APTT, INR in the last 168 hours.  Sepsis Markers  Recent Labs Lab 12/04/15 2003 12/04/15 2230  LATICACIDVEN 2.53* 3.4*    ABG  Recent  Labs Lab 12/04/15 1912 12/05/15 0325  PHART 7.356 7.341*  PCO2ART 82.7* 69.6*  PO2ART 72.0* 178*    Liver Enzymes  Recent Labs Lab 12/04/15 1928  AST 26  ALT 14  ALKPHOS 240*  BILITOT 0.5  ALBUMIN 1.2*    Cardiac Enzymes  Recent Labs Lab 12/05/15 0400  TROPONINI 0.27*    Glucose  Recent Labs Lab 12/05/15 0016 12/05/15 0043 12/05/15 0400 12/05/15 0814  GLUCAP 35* 142* 258* 238*    Imaging Dg Chest Port 1 View  12/05/2015  CLINICAL DATA:  Central line placement EXAM: PORTABLE CHEST 1 VIEW COMPARISON:  12/04/2015 FINDINGS: Tracheostomy. Left subclavian venous catheter with tip over the mid SVC likely at the junction of the brachiocephalic vein. New right jugular venous catheter with tip over the low SVC. No pneumothorax. Normal heart size and pulmonary vascularity. Small left pleural effusion with basilar atelectasis or infiltration. Focal airspace disease in the left upper lung. Followup after resolution of acute process is recommended to exclude underlying nodule. Probable emphysematous changes in the lungs. IMPRESSION: Appliances appear in satisfactory position. Left pleural effusion with basilar atelectasis or infiltration. Focal nodular infiltration in the left upper lung. Follow-up after resolution of acute process suggested. Electronically Signed   By: Burman Nieves M.D.   On: 12/05/2015 02:16  Dg Chest Port 1 View  12/04/2015  CLINICAL DATA:  Altered mental status.  Respiratory failure. EXAM: PORTABLE CHEST 1 VIEW COMPARISON:  09/26/2015 FINDINGS: Tracheostomy is centered on the tracheal air column. Left upper extremity PICC line extends to the expected location of the SVC just above the azygos vein junction. There is consolidation and effusion in the left base. This may represent pneumonia with parapneumonic effusion. Right lung is clear. Pulmonary vasculature is normal. Hilar and mediastinal contours are unremarkable. IMPRESSION: Left base consolidation and  effusion. Suspect pneumonia. Followup PA and lateral chest X-ray is recommended in 3-4 weeks following trial of antibiotic therapy to ensure resolution and exclude underlying malignancy. Electronically Signed   By: Ellery Plunk M.D.   On: 12/04/2015 19:35     STUDIES:  CXR 05/04 > left HCAP  CULTURES: Blood reflex 05/04 >> klebsiella pneumonia, enterococcus, pseudomonas aeruginosa and carbapenem resistance BCx2 5/4 >>  Urine 05/04 >> Sputum 05/04 >>  ANTIBIOTICS: Vanc 05/04 >> Zosyn 05/04 >> Cipro 05/04 >>  SIGNIFICANT EVENTS: 5/04  Admitted with septic shock presumed due to HCAP as well as anemia (Hgb 6.1)  LINES/TUBES: Trach (chronic) >> J tube (chronic) >> L PICC (chronic) >> will remove 05/04 as it is leaking. CVL L IJ 5/04 >>  DISCUSSION: 70 y.o. F with chronic trach / J tube from [facility], admitted 05/04 with septic shock and anemia.  ASSESSMENT / PLAN:  PULMONARY A: Chronic tracheostomy / vent dependence. LLL HCAP. Hx COPD. P:   Full vent support, PRVC 8 cc/kg Wean PEEP / FiO2 for sats > 92% VAP prevention measures. No SBT given that she has failed multiple times in the past. Abx / cultures per ID section. CXR in AM.  CARDIOVASCULAR A:  Septic shock - presumed due to HCAP. P:  Continue levophed as needed to maintain MAP > 65. Trend CVP Monitor troponin, lactate Consider echo. Hold outpatient carvedilol.  INFECTIOUS A:   Septic shock - presumed due to HCAP.  Hx pseudomonal and coag neg staph bacteremia last admission in Feb GNR / GPC Bacteremia  Hx C.diff. P:   Abx as above (vanc / zosyn / cipro) Follow cultures as above PCT algorithm to limit abx exposure ID consult with identified carbapenem resistance Add contact precautions, shoe covers Need to lateral transfer out of CVICU due to ID issues  HEMATOLOGIC A:   Anemia - acute on chronic.  Hgb now down to 6.1. VTE Prophylaxis. Hx DVT. Sacral decubitus ulcer stage IV P:  Monitor for  bleeding SCD's / Heparin CBC in AM  NEUROLOGIC A:   AMS - unclear etiology, likely due to sepsis. Hx anxiety / depression, chronic pain. P:   Monitor clinically. Limit sedating meds. Hold outpatient lorazepam, sertraline. Continue Namenda  RENAL A:   AKI - in setting of shock. P:   NS @ 75. BMP in AM. Ensure adequate renal perfusion  GASTROINTESTINAL A:   GI prophylaxis. Nutrition. P:   SUP: Pantoprazole. Consult nutrition for TF's.  ENDOCRINE A:   DM. P:   SSI.   Family updated: No family at bedside. Will need to discuss goals of care with family.   Interdisciplinary Family Meeting v Palliative Care Meeting:  Due by: 12/10/15.    Canary Brim, NP-C [facility] Pgr: [phone] or if no answer [phone] 12/05/2015, 9:49 AM   Attending:  I have seen and examined the patient with nurse practitioner/resident and agree with the note above.  We formulated the plan together  and I elicited the following history.    Ms. Talbert NanRosebush has multiple drug resistant organisms in her bloodstream causing septic shock. She has multiple potential sources including HCAP, her sacral ulcer, line infection, or possibly (but less likely) an acute intra-abdominal process.  On exam Frail, chronically ill appearing on vent  Lungs clear, but diminished left base Tracheostomy in place See picture of sacral decubitus ulcer I took today above  CXR images reviewed> left effusion ? LUL infiltrate  Chronic respiratory failure with hypoxemia> vent dependent, no weaning COPD > no exacerbation Multiple drug bacteremia> line infection, Carbepenem resistance, remove line, strict isolation precautions, ID consult Sacral decub present on admission> wound care HCAP> antibiotics per ID  Over all prognosis grim, I tried to call the son, but had to leave a message.  Will consult palliative medicine.  She will not survive this illness.    My cc time 40 minutes  Heber CarolinaBrent McQuaid,  MD [facility] Pager: [phone] Cell: [phone] After 3pm or if no response, call [phone]

## 2015-12-05 NOTE — Procedures (Signed)
Central Venous Catheter Insertion Procedure Note Kristen Arnold [id number] 12/25/1945  Procedure: Insertion of Central Venous Catheter Indications: Assessment of intravascular volume, Drug and/or fluid administration and Frequent blood sampling  Procedure Details Consent: Unable to obtain consent because of altered level of consciousness. Time Out: Verified patient identification, verified procedure, site/side was marked, verified correct patient position, special equipment/implants available, medications/allergies/relevent history reviewed, required imaging and test results available.  Performed  Maximum sterile technique was used including antiseptics, cap, gloves, gown, hand hygiene, mask and sheet. Skin prep: Chlorhexidine; local anesthetic administered A antimicrobial bonded/coated triple lumen catheter was placed in the left internal jugular vein using the Seldinger technique.  Evaluation Blood flow good Complications: No apparent complications Patient did tolerate procedure well. Chest X-ray ordered to verify placement.  CXR: pending.  Procedure performed under direct ultrasound guidance for real time vessel cannulation.      Kristen Arnold, [place] - C [facility] Pager: [phone]  or [phone] 12/05/2015, 1:57 AM

## 2015-12-05 NOTE — Progress Notes (Signed)
Pharmacy Antibiotic Note  Kristen ChessJudy Hoffer is a 70 y.o. female admitted on 12/04/2015 with sepsis and altered mental status.  Pharmacy has been consulted for Avycaz and Vancomycin dosing.  Patient found with carbapenem resistant Kleb pneumo (KPC), Enterococcus, and Pseudomonas in her blood cultures (4/4).  Plan: BCID results were discussed with Dr. Ninetta LightsHatcher of ID and Canary BrimBrandi Ollis CCM NP and the patient's antibiotics will be changed to Avycaz (ceftazidime-avibactam) and vancomycin.  Will discontinue Zosyn and ciprofloxacin. Further narrowing will be determined based on finalized susceptibilities.  Height: 5' (152.4 cm) Weight: 99 lb 13.9 oz (45.3 kg) IBW/kg (Calculated) : 45.5  Temp (24hrs), Avg:98.1 F (36.7 C), Min:96.5 F (35.8 C), Max:102.7 F (39.3 C)   Recent Labs Lab 12/04/15 1928 12/04/15 2003 12/04/15 2230 12/05/15 0400  WBC 39.4*  --   --  59.1*  CREATININE 1.51*  --   --  1.66*  LATICACIDVEN  --  2.53* 3.4*  --     Estimated Creatinine Clearance: 22.6 mL/min (by C-G formula based on Cr of 1.66).    Allergies  Allergen Reactions  . Codeine     Unknown reaction, listed on MAR    Antimicrobials this admission: Zosyn 5/4 >> 5/5 Cipro 5/4 >> 5/5 Vancomycin 5/4 >> Avycaz 5/4 >>  Dose adjustments this admission: None  Microbiology results: 5/5 BCx: KPC Kleb pneumo, enterococcus, pseudomonas 5/5 UCx: sent  5/4 Sputum: sent  5/5 MRSA PCR: sent  Thank you for allowing pharmacy to be a part of this patient's care.  Sashia Campas L. Roseanne RenoStewart, PharmD PGY2 Infectious Diseases Pharmacy Resident Pager: [phone] 12/05/2015 10:21 AM

## 2015-12-05 NOTE — Consult Note (Addendum)
WOC wound consult note Reason for Consult: Consult requested for G-tube and sacrum.  Pt has multiple systemic factors which can impair healing including incontinent and emaciated. Gtube has large amt green drainage leaking around tube.  1 cm red macerated skin surrounding tube location.  Attempted to push flange against skin and tube is loose and almost falling out; balloon which holds in place appears to have ruptured.  Notified bedside nurse, who plans to call CCS for further orders.  Feeds turned off at the time of the discovery. Wound type: Sacrum with chronic stage 4 pressure injury; 7X6X2cm with undermining to wound edges to 6cm.  Pressure Ulcer POA: Yes Wound bed: 50% red, 40% yellow, 10% black, bone palpable Drainage (amount, consistency, odor) Mod amt tan drainage and strong foul odor Periwound: Red macerated wound edges.  Pt is frequently incontinent of loose stools and it is difficult to keep wound from becoming soiled.   There are multiple areas of deep tissue injury; Left buttock 10X6cm, left leg 8X2cm, right buttock 5X3cm, right buttock 7X8cm Dressing procedure/placement/frequency:  Pt is on a Sport low airloss bed to reduce pressure.  Santyl ointment for chemical debridement of nonviable tissue.  If aggressive plan of care is desired, then recommend hydrotherapy to be performed by physical therapy; please order if desired.  Please re-consult if further assistance is needed.  Thank-you,  Cammie Mcgeeawn Myrian Botello MSN, RN, CWOCN, [place]CN-AP, CNS [phone]

## 2015-12-06 ENCOUNTER — Inpatient Hospital Stay (HOSPITAL_COMMUNITY): Payer: Medicare Other

## 2015-12-06 DIAGNOSIS — J9600 Acute respiratory failure, unspecified whether with hypoxia or hypercapnia: Secondary | ICD-10-CM | POA: Diagnosis present

## 2015-12-06 DIAGNOSIS — A0472 Enterocolitis due to Clostridium difficile, not specified as recurrent: Secondary | ICD-10-CM | POA: Diagnosis present

## 2015-12-06 DIAGNOSIS — A414 Sepsis due to anaerobes: Secondary | ICD-10-CM

## 2015-12-06 DIAGNOSIS — A498 Other bacterial infections of unspecified site: Secondary | ICD-10-CM | POA: Diagnosis present

## 2015-12-06 DIAGNOSIS — A047 Enterocolitis due to Clostridium difficile: Secondary | ICD-10-CM

## 2015-12-06 DIAGNOSIS — A4189 Other specified sepsis: Secondary | ICD-10-CM

## 2015-12-06 DIAGNOSIS — Z515 Encounter for palliative care: Secondary | ICD-10-CM

## 2015-12-06 DIAGNOSIS — Z1619 Resistance to other specified beta lactam antibiotics: Secondary | ICD-10-CM | POA: Diagnosis present

## 2015-12-06 DIAGNOSIS — A4181 Sepsis due to Enterococcus: Secondary | ICD-10-CM

## 2015-12-06 LAB — CBC
HCT: 26.3 % — ABNORMAL LOW (ref 36.0–46.0)
Hemoglobin: 8.7 g/dL — ABNORMAL LOW (ref 12.0–15.0)
MCH: 28.7 pg (ref 26.0–34.0)
MCHC: 33.1 g/dL (ref 30.0–36.0)
MCV: 86.8 fL (ref 78.0–100.0)
PLATELETS: 596 10*3/uL — AB (ref 150–400)
RBC: 3.03 MIL/uL — ABNORMAL LOW (ref 3.87–5.11)
RDW: 17.7 % — ABNORMAL HIGH (ref 11.5–15.5)
WBC: 35.1 10*3/uL — ABNORMAL HIGH (ref 4.0–10.5)

## 2015-12-06 LAB — COMPREHENSIVE METABOLIC PANEL
ALBUMIN: 1.1 g/dL — AB (ref 3.5–5.0)
ALT: 15 U/L (ref 14–54)
ANION GAP: 13 (ref 5–15)
AST: 21 U/L (ref 15–41)
Alkaline Phosphatase: 100 U/L (ref 38–126)
BILIRUBIN TOTAL: 0.5 mg/dL (ref 0.3–1.2)
BUN: 141 mg/dL — AB (ref 6–20)
CHLORIDE: 95 mmol/L — AB (ref 101–111)
CO2: 33 mmol/L — AB (ref 22–32)
CREATININE: 1.38 mg/dL — AB (ref 0.44–1.00)
Calcium: 7.5 mg/dL — ABNORMAL LOW (ref 8.9–10.3)
GFR, EST AFRICAN AMERICAN: 44 mL/min — AB (ref >60)
GFR, EST NON AFRICAN AMERICAN: 38 mL/min — AB (ref >60)
GLUCOSE: 184 mg/dL — AB (ref 65–99)
POTASSIUM: 4.1 mmol/L (ref 3.5–5.1)
Sodium: 141 mmol/L (ref 135–145)
Total Protein: 5.5 g/dL — ABNORMAL LOW (ref 6.5–8.1)

## 2015-12-06 LAB — URINE CULTURE: Culture: NO GROWTH

## 2015-12-06 LAB — GLUCOSE, CAPILLARY
GLUCOSE-CAPILLARY: 162 mg/dL — AB (ref 65–99)
GLUCOSE-CAPILLARY: 170 mg/dL — AB (ref 65–99)
GLUCOSE-CAPILLARY: 184 mg/dL — AB (ref 65–99)
Glucose-Capillary: 175 mg/dL — ABNORMAL HIGH (ref 65–99)
Glucose-Capillary: 173 mg/dL — ABNORMAL HIGH (ref 65–99)
Glucose-Capillary: 171 mg/dL — ABNORMAL HIGH (ref 65–99)
Glucose-Capillary: 182 mg/dL — ABNORMAL HIGH (ref 65–99)

## 2015-12-06 LAB — TYPE AND SCREEN
ABO/RH(D): O NEG
Antibody Screen: NEGATIVE
Unit division: 0
Unit division: 0

## 2015-12-06 MED ORDER — VANCOMYCIN 50 MG/ML ORAL SOLUTION
500.0000 mg | Freq: Four times a day (QID) | ORAL | Status: DC
  Filled 2015-12-06 (×2): qty 10

## 2015-12-06 MED ORDER — VANCOMYCIN 50 MG/ML ORAL SOLUTION
500.0000 mg | Freq: Four times a day (QID) | ORAL | Status: DC
  Filled 2015-12-06 (×31): qty 10

## 2015-12-06 MED ORDER — CHLORHEXIDINE GLUCONATE CLOTH 2 % EX PADS
6.0000 | MEDICATED_PAD | Freq: Every day | CUTANEOUS | Status: AC
  Administered 2015-12-07 – 2015-12-11 (×5): 6 via TOPICAL

## 2015-12-06 MED ORDER — ALBUTEROL SULFATE (2.5 MG/3ML) 0.083% IN NEBU
2.5000 mg | INHALATION_SOLUTION | Freq: Two times a day (BID) | RESPIRATORY_TRACT | Status: AC
  Administered 2015-12-06 – 2015-12-07 (×4): 2.5 mg via RESPIRATORY_TRACT
  Filled 2015-12-06 (×4): qty 3

## 2015-12-06 MED ORDER — MUPIROCIN 2 % EX OINT
1.0000 "application " | TOPICAL_OINTMENT | Freq: Two times a day (BID) | CUTANEOUS | Status: AC
  Administered 2015-12-07 – 2015-12-11 (×9): 1 via NASAL
  Filled 2015-12-06: qty 22

## 2015-12-06 MED ORDER — ACETYLCYSTEINE 20 % IN SOLN
4.0000 mL | Freq: Two times a day (BID) | RESPIRATORY_TRACT | Status: AC
  Administered 2015-12-06 – 2015-12-07 (×4): 4 mL via RESPIRATORY_TRACT
  Filled 2015-12-06 (×4): qty 4

## 2015-12-06 MED ORDER — MORPHINE SULFATE (PF) 2 MG/ML IV SOLN
2.0000 mg | INTRAVENOUS | Status: DC | PRN
  Administered 2015-12-06 – 2015-12-18 (×27): 2 mg via INTRAVENOUS
  Filled 2015-12-06 (×28): qty 1

## 2015-12-06 MED ORDER — MORPHINE SULFATE (PF) 4 MG/ML IV SOLN
4.0000 mg | INTRAVENOUS | Status: DC | PRN
  Administered 2015-12-07 – 2015-12-12 (×8): 4 mg via INTRAVENOUS
  Filled 2015-12-06 (×8): qty 1

## 2015-12-06 NOTE — Progress Notes (Signed)
Called Harvie HeckRandy to inform him of 10 am family conference call tomorrow. Called Barbara CowerJason to inform him of 10am family conference call.  [name]. Lakyra Tippins, RN

## 2015-12-06 NOTE — Progress Notes (Signed)
Called patient's sonHarvie Heck- Randy at [phone] twice, no voicemail has been setup. Unable to leave a message. Will attempt to call son again.  [name]. Elihu Milstein, RN

## 2015-12-06 NOTE — Progress Notes (Signed)
Kristen Arnold called and stated that he was taking a nap when I called earlier.  Informed Kristen Arnold of the decision of having their mother be a DNR by his brother Barbara CowerJason. He agreed with that decision. He stated that he did not want his mother to suffer. Informed his that he Doctor will be reaching out to him and his brother tomorrow to discuss the plan of care for their mother.  Kristen Arnold informed me that he loves his mother and his brother and uncle wants her dead.  Kristen Arnold stated that his mother lived with him and he has cared for her.   [name]. Trejon Duford, RN

## 2015-12-06 NOTE — Progress Notes (Signed)
Spoke with Barbara CowerJason after Dr. Tyson AliasFeinstein discuss patient status and prognosis. Barbara CowerJason stated that he is comfortable making the decision for DNR for his mother.  Asked Barbara CowerJason if he would be available for a conference phone tomorrow with his brother to have another discussion about his mother. Barbara CowerJason stated that he will be available all day tomorrow and he will have his phone on him. He does not know if Harvie HeckRandy will be available tomorrow and if he will be able to contact him.  [name]. Tyja Gortney, RN

## 2015-12-06 NOTE — Progress Notes (Signed)
Attempting to reach son,  at numbers [phone] ( voice mail set up and message states this belongs to "Harvie HeckRandy"") as well as number [phone] ( voice mail set up and message identifies as Eliott Nineandolph Gerr ). LM on both numbers with brief description of pt's critical condition and the imperative need to speak with him. Nursing unit number given as well as PMT number [phone]. Number [phone] no one answers and no voice mail set up. Will continue to FU and arrange GOC discussion. Eduard RouxSarah Nicholle Falzon, ANP

## 2015-12-06 NOTE — Progress Notes (Signed)
PULMONARY / CRITICAL CARE MEDICINE   Name: Kristen Arnold MRN: [id number] DOB: 1946-02-07    ADMISSION DATE:  12/04/2015 CONSULTATION DATE:  12/04/15  REFERRING MD:  EDP  CHIEF COMPLAINT:  AMS  SUBJECTIVE:  RT reports weaning PEEP/FiO2 - remains on full support.  Levophed at 5 mcg, weaning.    VITAL SIGNS: BP 104/49 mmHg  Pulse 74  Temp(Src) 97.7 F (36.5 C) (Oral)  Resp 19  Ht 5' (1.524 m)  Wt 49.7 kg (109 lb 9.1 oz)  BMI 21.40 kg/m2  SpO2 100%  HEMODYNAMICS:    VENTILATOR SETTINGS: Vent Mode:  [-] PRVC FiO2 (%):  [50 %-60 %] 60 % Set Rate:  [16 bmp] 16 bmp Vt Set:  [480 mL] 480 mL PEEP:  [5 cmH20] 5 cmH20 Plateau Pressure:  [20 cmH20-27 cmH20] 23 cmH20  INTAKE / OUTPUT: I/O last 3 completed shifts: In: 7695.9 [I.V.:6455.2; Blood:674.5; NG/GT:16.3; IV Piggyback:550] Out: 1453 [Urine:1450; Stool:3]   PHYSICAL EXAMINATION: General: Chronically ill appearing elderly female, cachectic, in NAD. Neuro: opens eyes to touch, not fc HEENT: [place]/AT. PERRL, trach clean Cardiovascular: RRR, no M/R/G.  Lungs: coarse Abdomen: BS x 4, soft, NT/ND. J tube in place. Musculoskeletal: No gross deformities, no edema.  Skin: Intact, warm, no rashes.       LABS:  BMET  Recent Labs Lab 12/04/15 1928 12/05/15 0400 12/06/15 0533  NA 141 139 141  K 5.1 5.3* 4.1  CL 89* 90* 95*  CO2 38* 34* 33*  BUN 169* 164* 141*  CREATININE 1.51* 1.66* 1.38*  GLUCOSE 207* 254* 184*    Electrolytes  Recent Labs Lab 12/04/15 1928 12/05/15 0400 12/06/15 0533  CALCIUM 9.1 8.1* 7.5*  MG  --  2.3  --   PHOS  --  4.6  --     CBC  Recent Labs Lab 12/04/15 1928 12/05/15 0400 12/06/15 0533  WBC 39.4* 59.1* 35.1*  HGB 6.1* 7.3* 8.7*  HCT 20.8* 23.6* 26.3*  PLT 729* 653* 596*    Coag's No results for input(s): APTT, INR in the last 168 hours.  Sepsis Markers  Recent Labs Lab 12/04/15 2003 12/04/15 2230 12/05/15 1110  LATICACIDVEN 2.53* 3.4* 2.1*    ABG  Recent  Labs Lab 12/04/15 1912 12/05/15 0325  PHART 7.356 7.341*  PCO2ART 82.7* 69.6*  PO2ART 72.0* 178*    Liver Enzymes  Recent Labs Lab 12/04/15 1928 12/06/15 0533  AST 26 21  ALT 14 15  ALKPHOS 240* 100  BILITOT 0.5 0.5  ALBUMIN 1.2* 1.1*    Cardiac Enzymes  Recent Labs Lab 12/05/15 0400 12/05/15 1110  TROPONINI 0.27* 0.18*    Glucose  Recent Labs Lab 12/05/15 1217 12/05/15 1635 12/05/15 1739 12/05/15 1952 12/05/15 2359 12/06/15 0353  GLUCAP 121* 184* 180* 172* 170* 175*    Imaging Dg Chest Port 1 View  12/06/2015  CLINICAL DATA:  Acute respiratory failure EXAM: PORTABLE CHEST 1 VIEW COMPARISON:  Yesterday FINDINGS: Tracheostomy tube remains seated. The trachea is likely distended by the cuff. Left IJ central line with tip at the SVC level. The left upper extremity PICC is been removed. Progressive opacification of the left chest with worsened volume loss, likely interval lower lobe collapse. Left pleural effusion which could be loculated. COPD. These results will be called to the ordering clinician or representative by the Radiologist Assistant, and communication documented in the PACS or zVision Dashboard. IMPRESSION: 1. Tracheostomy tube with widened trachea suggesting cuff over inflation. 2. Interval collapse of the left lower lobe  and increased pleural effusion, possibly loculated. History suggests underlying pneumonia and parapneumonic effusion. 3. COPD. Electronically Signed   By: Marnee SpringJonathon  Watts M.D.   On: 12/06/2015 07:29     STUDIES:  CXR 05/04 > left HCAP  CULTURES: Blood reflex 05/04 >> klebsiella pneumonia, enterococcus, pseudomonas aeruginosa and carbapenem resistance BCx2 5/4 >>  Urine 05/04 >> Sputum 05/04 >>  ANTIBIOTICS: Vanc 05/04 >> Zosyn 05/04 >> Cipro 05/04 >>  SIGNIFICANT EVENTS: 5/04  Admitted with septic shock presumed due to HCAP as well as anemia (Hgb 6.1)  LINES/TUBES: Trach (chronic) >> J tube (chronic) >> L PICC (chronic)  >> will remove 05/04 as it is leaking. CVL L IJ 5/04 >>  DISCUSSION: 70 y.o. F with chronic trach / J tube from [facility], admitted 05/04 with septic shock and anemia.  ASSESSMENT / PLAN:  PULMONARY A: Chronic tracheostomy / vent dependence. LLL HCAP. Hx COPD. Cuff overdistention? New collapse / effusion / ATX left - PNA P:   Peep increase needed  Chest pt left mucomyust Bronch would be futile Continued vent is futile and medically ineffective pcxr in am  Keep same MV Checking cuff pressure  CARDIOVASCULAR A:  Septic shock - presumed due to HCAP. P:  Continue levophed as needed to maintain MAP > 60 Goal to off Tele Pos balance  INFECTIOUS A:   Septic shock - presumed due to HCAP.  Hx pseudomonal and coag neg staph bacteremia last admission in Feb GNR / GPC Bacteremia  Hx C.diff. PNA left, effusion P:   Abx as above (vanc / zosyn / cipro) Follow cultures as above pcxr ID abx Wound care  HEMATOLOGIC A:   Anemia - acute on chronic.  Hgb now down to 6.1. VTE Prophylaxis. Hx DVT. Sacral decubitus ulcer stage IV P:  SCD's / Heparin CBC in AM  NEUROLOGIC A:   AMS - unclear etiology, likely due to sepsis. Hx anxiety / depression, chronic pain. P:   Monitor clinically. Limit sedating meds. Once improved neur, restart to avoid WD  - lorazepam  sertraline. Continue Namenda Consider immediate comfort care, calling son  RENAL A:   AKI - in setting of shock. P:   NS @ 75. BMP in AM.  GASTROINTESTINAL A:   GI prophylaxis. Nutrition. P:   SUP: Pantoprazole. TF hold, peg fxn in ? Hold off replacement until d/w son comfort?  ENDOCRINE A:   DM. P:   SSI.   Family updated: No family at bedside. Will need to discuss goals of care with family. - called son now  Interdisciplinary Family Meeting v Palliative Care Meeting:  Due by: 12/10/15.  Ccm time 35 min   Mcarthur Rossettianiel J. Tyson AliasFeinstein, MD, FACP Pgr: [phone] [facility]

## 2015-12-06 NOTE — Progress Notes (Addendum)
Patient ID: Kristen Arnold, female   DOB: 10/23/1945, 70 y.o.   MRN: [id number] I have had extensive discussions with family son. We discussed patients current circumstances and organ failures. We also discussed patient's prior wishes under circumstances such as this. Family has decided to NOT perform resuscitation if arrest but to continue current medical support for now. NO escalation i will talk to both sons 10 am Sunday to move toward comfort  Any further aggressive care would be medically ineffective and futile   Mcarthur Rossettianiel J. Tyson AliasFeinstein, MD, FACP Pgr: [phone] [facility]

## 2015-12-06 NOTE — Progress Notes (Signed)
Kristen Arnold, has another son name Kristen Arnold. Cell number- [phone]. Will inform Maralyn SagoSarah when she is done with the family conference she is in.  [name]. [name], RN

## 2015-12-06 NOTE — Progress Notes (Addendum)
Spoke with Big Lotsob Summer's, Leggett & PlattJudy Brother. Informed him of difficulty in reaching Mount CrawfordRandy on [phone].Rob stated that when Harvie HeckRandy drinks it hard to get a hold of him.  Rob stated that Harvie HeckRandy does not activate his voicemail and patient's does have another son name Barbara CowerJason. Barbara CowerJason resides with Cleone SlimRob, his uncle.    [name]. Esma Kilts, RN

## 2015-12-06 NOTE — Progress Notes (Signed)
Subjective: Intubated, sedated   Antibiotics:  Anti-infectives    Start     Dose/Rate Route Frequency Ordered Stop   12/06/15 2200  levofloxacin (LEVAQUIN) IVPB 500 mg  Status:  Discontinued     500 mg 100 mL/hr over 60 Minutes Intravenous Every 48 hours 12/04/15 2229 12/04/15 2229   12/05/15 2000  vancomycin (VANCOCIN) 500 mg in sodium chloride 0.9 % 100 mL IVPB     500 mg 100 mL/hr over 60 Minutes Intravenous Every 24 hours 12/04/15 2229     12/05/15 1800  vancomycin (VANCOCIN) 50 mg/mL oral solution 500 mg  Status:  Discontinued     500 mg Oral Every 6 hours 12/05/15 1531 12/05/15 1606   12/05/15 1700  metroNIDAZOLE (FLAGYL) IVPB 500 mg     500 mg 100 mL/hr over 60 Minutes Intravenous Every 8 hours 12/05/15 1606     12/05/15 1100  ceftazidime-avibactam (AVYCAZ) 0.94 g in dextrose 5 % 50 mL IVPB     0.94 g 25 mL/hr over 2 Hours Intravenous Every 12 hours 12/05/15 1008     12/05/15 1015  ceftazidime-avibactam (AVYCAZ) 0.94 g in dextrose 5 % 50 mL IVPB  Status:  Discontinued     0.94 g 25 mL/hr over 2 Hours Intravenous Every 12 hours 12/05/15 1008 12/05/15 1008   12/05/15 0400  piperacillin-tazobactam (ZOSYN) IVPB 3.375 g  Status:  Discontinued     3.375 g 12.5 mL/hr over 240 Minutes Intravenous Every 8 hours 12/04/15 2229 12/05/15 1006   12/04/15 2300  ciprofloxacin (CIPRO) IVPB 200 mg  Status:  Discontinued     200 mg 100 mL/hr over 60 Minutes Intravenous Every 24 hours 12/04/15 2235 12/05/15 1006   12/04/15 2230  levofloxacin (LEVAQUIN) IVPB 750 mg  Status:  Discontinued     750 mg 100 mL/hr over 90 Minutes Intravenous  Once 12/04/15 2229 12/04/15 2229   12/04/15 1930  vancomycin (VANCOCIN) IVPB 1000 mg/200 mL premix     1,000 mg 200 mL/hr over 60 Minutes Intravenous  Once 12/04/15 1924 12/04/15 2041   12/04/15 1930  piperacillin-tazobactam (ZOSYN) IVPB 3.375 g     3.375 g 100 mL/hr over 30 Minutes Intravenous  Once 12/04/15 1924 12/04/15 1958       Medications: Scheduled Meds: . sodium chloride   Intravenous Once  . acetylcysteine  4 mL Nebulization BID  . albuterol  2.5 mg Nebulization Q12H  . antiseptic oral rinse  7 mL Mouth Rinse QID  . ceftazidime avibactam (AVYCAZ) IVPB  0.94 g Intravenous Q12H  . chlorhexidine gluconate (SAGE KIT)  15 mL Mouth Rinse BID  . collagenase   Topical Daily  . heparin  5,000 Units Subcutaneous Q8H  . insulin aspart  0-9 Units Subcutaneous Q4H  . memantine  5 mg Per Tube BID  . metronidazole  500 mg Intravenous Q8H  . pantoprazole (PROTONIX) IV  40 mg Intravenous QHS  . sodium chloride flush  10-40 mL Intracatheter Q12H  . vancomycin  500 mg Intravenous Q24H   Continuous Infusions: . sodium chloride 75 mL/hr at 12/06/15 0910  . feeding supplement (VITAL 1.5 CAL) Stopped (12/05/15 1450)  . norepinephrine (LEVOPHED) Adult infusion 2 mcg/min (12/06/15 1130)   PRN Meds:.sodium chloride, sodium chloride flush    Objective: Weight change: 9 lb 9.1 oz (4.34 kg)  Intake/Output Summary (Last 24 hours) at 12/06/15 1433 Last data filed at 12/06/15 1211  Gross per 24 hour  Intake 2153.95 ml  Output  1250 ml  Net 903.95 ml   Blood pressure 106/56, pulse 86, temperature 97.8 F (36.6 C), temperature source Oral, resp. rate 24, height 5' (1.524 m), weight 109 lb 9.1 oz (49.7 kg), SpO2 96 %. Temp:  [97.4 F (36.3 C)-98.3 F (36.8 C)] 97.8 F (36.6 C) (05/06 1100) Pulse Rate:  [46-86] 86 (05/06 1400) Resp:  [14-27] 24 (05/06 1400) BP: (85-118)/(44-61) 106/56 mmHg (05/06 1400) SpO2:  [86 %-100 %] 96 % (05/06 1400) FiO2 (%):  [40 %-60 %] 40 % (05/06 1300) Weight:  [109 lb 9.1 oz (49.7 kg)] 109 lb 9.1 oz (49.7 kg) (05/06 0500)  Physical Exam: General: Intubated, sedated,  HEENT: Tracheostomy in place CVS regular rate, normal r,  no murmur rubs or gallops Chest: Coarse breath sounds throughout Abdomen: nondistended, normal bowel sounds, Extremities: no  clubbing or edema noted  bilaterally Skin: no rashes Lymph: no new lymphadenopathy Neuro: nonfocal  CBC: CBC Latest Ref Rng 12/06/2015 12/05/2015 12/04/2015  WBC 4.0 - 10.5 K/uL 35.1(H) 59.1(HH) 39.4(H)  Hemoglobin 12.0 - 15.0 g/dL 8.7(L) 7.3(L) 6.1(LL)  Hematocrit 36.0 - 46.0 % 26.3(L) 23.6(L) 20.8(L)  Platelets 150 - 400 K/uL 596(H) 653(H) 729(H)       BMET  Recent Labs  12/05/15 0400 12/06/15 0533  NA 139 141  K 5.3* 4.1  CL 90* 95*  CO2 34* 33*  GLUCOSE 254* 184*  BUN 164* 141*  CREATININE 1.66* 1.38*  CALCIUM 8.1* 7.5*     Liver Panel   Recent Labs  12/04/15 1928 12/06/15 0533  PROT 6.3* 5.5*  ALBUMIN 1.2* 1.1*  AST 26 21  ALT 14 15  ALKPHOS 240* 100  BILITOT 0.5 0.5       Sedimentation Rate No results for input(s): ESRSEDRATE in the last 72 hours. C-Reactive Protein No results for input(s): CRP in the last 72 hours.  Micro Results: Recent Results (from the past 720 hour(s))  Blood Culture (routine x 2)     Status: None (Preliminary result)   Collection Time: 12/04/15  7:28 PM  Result Value Ref Range Status   Specimen Description BLOOD PICC LINE  Final   Special Requests BOTTLES DRAWN AEROBIC AND ANAEROBIC 5CC  Final   Culture  Setup Time   Final    Organism ID to follow GRAM POSITIVE COCCI GRAM NEGATIVE RODS IN BOTH AEROBIC AND ANAEROBIC BOTTLES CRITICAL RESULT CALLED TO, READ BACK BY AND VERIFIED WITH: CLamont Snowball D AT [place] ON [id number] BY Rhea Bleacher    Culture GRAM NEGATIVE RODS  Final   Report Status PENDING  Incomplete  Blood Culture (routine x 2)     Status: None (Preliminary result)   Collection Time: 12/04/15  7:28 PM  Result Value Ref Range Status   Specimen Description BLOOD PICC LINE  Final   Special Requests BOTTLES DRAWN AEROBIC AND ANAEROBIC 5CC  Final   Culture  Setup Time   Final    GRAM POSITIVE COCCI GRAM NEGATIVE RODS IN BOTH AEROBIC AND ANAEROBIC BOTTLES CRITICAL RESULT CALLED TO, READ BACK BY AND VERIFIED WITH: CLamont Snowball D AT [place]  ON [id number] BY Rhea Bleacher    Culture GRAM NEGATIVE RODS  Final   Report Status PENDING  Incomplete  Blood Culture ID Panel (Reflexed)     Status: Abnormal   Collection Time: 12/04/15  7:28 PM  Result Value Ref Range Status   Enterococcus species DETECTED (A) NOT DETECTED Final    Comment: CRITICAL RESULT CALLED TO, READ BACK BY AND  VERIFIED WITH: C. STEWART, PHARM D AT 9983 ON [id number] BY S. YARBROUGH    Vancomycin resistance NOT DETECTED NOT DETECTED Final   Listeria monocytogenes NOT DETECTED NOT DETECTED Final   Staphylococcus species NOT DETECTED NOT DETECTED Final   Staphylococcus aureus NOT DETECTED NOT DETECTED Final   Methicillin resistance NOT DETECTED NOT DETECTED Final   Streptococcus species NOT DETECTED NOT DETECTED Final   Streptococcus agalactiae NOT DETECTED NOT DETECTED Final   Streptococcus pneumoniae NOT DETECTED NOT DETECTED Final   Streptococcus pyogenes NOT DETECTED NOT DETECTED Final   Acinetobacter baumannii NOT DETECTED NOT DETECTED Final   Enterobacteriaceae species NOT DETECTED NOT DETECTED Final   Enterobacter cloacae complex NOT DETECTED NOT DETECTED Final   Escherichia coli NOT DETECTED NOT DETECTED Final   Klebsiella oxytoca NOT DETECTED NOT DETECTED Final   Klebsiella pneumoniae DETECTED (A) NOT DETECTED Final    Comment: CRITICAL RESULT CALLED TO, READ BACK BY AND VERIFIED WITH: C. STEWART,PHARM D AT 0855 ON [id number] BY S. YARBROUGH    Proteus species NOT DETECTED NOT DETECTED Final   Serratia marcescens NOT DETECTED NOT DETECTED Final   Carbapenem resistance DETECTED (A) NOT DETECTED Final    Comment: CRITICAL RESULT CALLED TO, READ BACK BY AND VERIFIED WITH: C. Lamont Snowball D AT 0855 ON [id number] BY S. YARBROUGH AND NOTIFIED NATALIE DAVIS IN INFECTION CONTROL AT 0900 ON [id number] BY S. YARBROUGH    Haemophilus influenzae NOT DETECTED NOT DETECTED Final   Neisseria meningitidis NOT DETECTED NOT DETECTED Final   Pseudomonas aeruginosa DETECTED (A) NOT  DETECTED Final    Comment: CRITICAL RESULT CALLED TO, READ BACK BY AND VERIFIED WITH: CLamont Snowball D AT 0855 ON [id number] BY S. YARBROUGH    Candida albicans NOT DETECTED NOT DETECTED Final   Candida glabrata NOT DETECTED NOT DETECTED Final   Candida krusei NOT DETECTED NOT DETECTED Final   Candida parapsilosis NOT DETECTED NOT DETECTED Final   Candida tropicalis NOT DETECTED NOT DETECTED Final  Urine culture     Status: None   Collection Time: 12/04/15  7:48 PM  Result Value Ref Range Status   Specimen Description URINE, CATHETERIZED  Final   Special Requests NONE  Final   Culture NO GROWTH 1 DAY  Final   Report Status 12/06/2015 FINAL  Final  MRSA PCR Screening     Status: Abnormal   Collection Time: 12/05/15 12:04 AM  Result Value Ref Range Status   MRSA by PCR POSITIVE (A) NEGATIVE Final    Comment:        The GeneXpert MRSA Assay (FDA approved for NASAL specimens only), is one component of a comprehensive MRSA colonization surveillance program. It is not intended to diagnose MRSA infection nor to guide or monitor treatment for MRSA infections. RESULT CALLED TO, READ BACK BY AND VERIFIED WITH: Filbert Berthold RN 10:40 12/05/15 ([email or web address])   Wound culture     Status: None (Preliminary result)   Collection Time: 12/05/15  7:30 AM  Result Value Ref Range Status   Specimen Description WOUND  Final   Special Requests SACRAL  Final   Gram Stain PENDING  Incomplete   Culture   Final    Culture reincubated for better growth Performed at [facility]    Report Status PENDING  Incomplete  C difficile quick scan w PCR reflex     Status: Abnormal   Collection Time: 12/05/15  6:01 PM  Result Value Ref Range Status   C Diff antigen POSITIVE (A) NEGATIVE Final  C Diff toxin POSITIVE (A) NEGATIVE Final   C Diff interpretation Positive for toxigenic C. difficile  Final    Comment: CRITICAL RESULT CALLED TO, READ BACK BY AND VERIFIED WITH: Bryson Dames RN 1929 12/05/15 A BROWNING      Studies/Results: Dg Chest Port 1 View  12/06/2015  CLINICAL DATA:  Acute respiratory failure EXAM: PORTABLE CHEST 1 VIEW COMPARISON:  Yesterday FINDINGS: Tracheostomy tube remains seated. The trachea is likely distended by the cuff. Left IJ central line with tip at the SVC level. The left upper extremity PICC is been removed. Progressive opacification of the left chest with worsened volume loss, likely interval lower lobe collapse. Left pleural effusion which could be loculated. COPD. These results will be called to the ordering clinician or representative by the Radiologist Assistant, and communication documented in the PACS or zVision Dashboard. IMPRESSION: 1. Tracheostomy tube with widened trachea suggesting cuff over inflation. 2. Interval collapse of the left lower lobe and increased pleural effusion, possibly loculated. History suggests underlying pneumonia and parapneumonic effusion. 3. COPD. Electronically Signed   By: Monte Fantasia M.D.   On: 12/06/2015 07:29   Dg Chest Port 1 View  12/05/2015  CLINICAL DATA:  Central line placement EXAM: PORTABLE CHEST 1 VIEW COMPARISON:  12/04/2015 FINDINGS: Tracheostomy. Left subclavian venous catheter with tip over the mid SVC likely at the junction of the brachiocephalic vein. New right jugular venous catheter with tip over the low SVC. No pneumothorax. Normal heart size and pulmonary vascularity. Small left pleural effusion with basilar atelectasis or infiltration. Focal airspace disease in the left upper lung. Followup after resolution of acute process is recommended to exclude underlying nodule. Probable emphysematous changes in the lungs. IMPRESSION: Appliances appear in satisfactory position. Left pleural effusion with basilar atelectasis or infiltration. Focal nodular infiltration in the left upper lung. Follow-up after resolution of acute process suggested. Electronically Signed   By: Lucienne Capers M.D.   On: 12/05/2015 02:16   Dg Chest Port 1  View  12/04/2015  CLINICAL DATA:  Altered mental status.  Respiratory failure. EXAM: PORTABLE CHEST 1 VIEW COMPARISON:  09/26/2015 FINDINGS: Tracheostomy is centered on the tracheal air column. Left upper extremity PICC line extends to the expected location of the SVC just above the azygos vein junction. There is consolidation and effusion in the left base. This may represent pneumonia with parapneumonic effusion. Right lung is clear. Pulmonary vasculature is normal. Hilar and mediastinal contours are unremarkable. IMPRESSION: Left base consolidation and effusion. Suspect pneumonia. Followup PA and lateral chest X-ray is recommended in 3-4 weeks following trial of antibiotic therapy to ensure resolution and exclude underlying malignancy. Electronically Signed   By: Andreas Newport M.D.   On: 12/04/2015 19:35      Assessment/Plan:  INTERVAL HISTORY:   Polymicrobial cultures still incubating for Sensis  CDI + by antigen and toxin   Active Problems:   Sepsis ([place])   Encounter for central line placement    Tona Qualley is a 70 y.o. female with  Polymicrobial bacteremia with Enterococcus, CRE klebsiella, pseudomonas in blood with sepsis now with CDI  #1 polymicrobial bacteremia continue Avycaz, vanco, flagyl  #2 CDI: start NG vancomycin, continue IV flagyl  Prognosis is abysmal.     LOS: 2 days   Alcide Evener 12/06/2015, 2:33 PM

## 2015-12-07 ENCOUNTER — Inpatient Hospital Stay (HOSPITAL_COMMUNITY): Payer: Medicare Other

## 2015-12-07 DIAGNOSIS — B961 Klebsiella pneumoniae [K. pneumoniae] as the cause of diseases classified elsewhere: Secondary | ICD-10-CM

## 2015-12-07 LAB — GLUCOSE, CAPILLARY
GLUCOSE-CAPILLARY: 108 mg/dL — AB (ref 65–99)
GLUCOSE-CAPILLARY: 113 mg/dL — AB (ref 65–99)
GLUCOSE-CAPILLARY: 115 mg/dL — AB (ref 65–99)
Glucose-Capillary: 120 mg/dL — ABNORMAL HIGH (ref 65–99)
Glucose-Capillary: 115 mg/dL — ABNORMAL HIGH (ref 65–99)
Glucose-Capillary: 96 mg/dL (ref 65–99)

## 2015-12-07 MED ORDER — SODIUM CHLORIDE 0.9 % IV BOLUS (SEPSIS)
500.0000 mL | Freq: Once | INTRAVENOUS | Status: AC
  Administered 2015-12-07: 500 mL via INTRAVENOUS

## 2015-12-07 NOTE — Progress Notes (Signed)
Subjective: Intubated   Antibiotics:  Anti-infectives    Start     Dose/Rate Route Frequency Ordered Stop   12/06/15 2200  levofloxacin (LEVAQUIN) IVPB 500 mg  Status:  Discontinued     500 mg 100 mL/hr over 60 Minutes Intravenous Every 48 hours 12/04/15 2229 12/04/15 2229   12/06/15 1600  vancomycin (VANCOCIN) 50 mg/mL oral solution 500 mg     500 mg Oral Every 6 hours 12/06/15 1451 12/20/15 1159   12/06/15 1445  vancomycin (VANCOCIN) 50 mg/mL oral solution 500 mg  Status:  Discontinued     500 mg Oral Every 6 hours 12/06/15 1436 12/06/15 1451   12/05/15 2000  vancomycin (VANCOCIN) 500 mg in sodium chloride 0.9 % 100 mL IVPB     500 mg 100 mL/hr over 60 Minutes Intravenous Every 24 hours 12/04/15 2229     12/05/15 1800  vancomycin (VANCOCIN) 50 mg/mL oral solution 500 mg  Status:  Discontinued     500 mg Oral Every 6 hours 12/05/15 1531 12/05/15 1606   12/05/15 1700  metroNIDAZOLE (FLAGYL) IVPB 500 mg     500 mg 100 mL/hr over 60 Minutes Intravenous Every 8 hours 12/05/15 1606     12/05/15 1100  ceftazidime-avibactam (AVYCAZ) 0.94 g in dextrose 5 % 50 mL IVPB     0.94 g 25 mL/hr over 2 Hours Intravenous Every 12 hours 12/05/15 1008     12/05/15 1015  ceftazidime-avibactam (AVYCAZ) 0.94 g in dextrose 5 % 50 mL IVPB  Status:  Discontinued     0.94 g 25 mL/hr over 2 Hours Intravenous Every 12 hours 12/05/15 1008 12/05/15 1008   12/05/15 0400  piperacillin-tazobactam (ZOSYN) IVPB 3.375 g  Status:  Discontinued     3.375 g 12.5 mL/hr over 240 Minutes Intravenous Every 8 hours 12/04/15 2229 12/05/15 1006   12/04/15 2300  ciprofloxacin (CIPRO) IVPB 200 mg  Status:  Discontinued     200 mg 100 mL/hr over 60 Minutes Intravenous Every 24 hours 12/04/15 2235 12/05/15 1006   12/04/15 2230  levofloxacin (LEVAQUIN) IVPB 750 mg  Status:  Discontinued     750 mg 100 mL/hr over 90 Minutes Intravenous  Once 12/04/15 2229 12/04/15 2229   12/04/15 1930  vancomycin (VANCOCIN) IVPB 1000  mg/200 mL premix     1,000 mg 200 mL/hr over 60 Minutes Intravenous  Once 12/04/15 1924 12/04/15 2041   12/04/15 1930  piperacillin-tazobactam (ZOSYN) IVPB 3.375 g     3.375 g 100 mL/hr over 30 Minutes Intravenous  Once 12/04/15 1924 12/04/15 1958      Medications: Scheduled Meds: . sodium chloride   Intravenous Once  . acetylcysteine  4 mL Nebulization BID  . albuterol  2.5 mg Nebulization Q12H  . antiseptic oral rinse  7 mL Mouth Rinse QID  . ceftazidime avibactam (AVYCAZ) IVPB  0.94 g Intravenous Q12H  . chlorhexidine gluconate (SAGE KIT)  15 mL Mouth Rinse BID  . Chlorhexidine Gluconate Cloth  6 each Topical Q0600  . collagenase   Topical Daily  . heparin  5,000 Units Subcutaneous Q8H  . insulin aspart  0-9 Units Subcutaneous Q4H  . memantine  5 mg Per Tube BID  . metronidazole  500 mg Intravenous Q8H  . mupirocin ointment  1 application Nasal BID  . pantoprazole (PROTONIX) IV  40 mg Intravenous QHS  . sodium chloride flush  10-40 mL Intracatheter Q12H  . vancomycin  500 mg Oral Q6H  . vancomycin  500 mg Intravenous Q24H   Continuous Infusions: . sodium chloride 75 mL/hr at 12/07/15 1800  . feeding supplement (VITAL 1.5 CAL) Stopped (12/05/15 1450)  . norepinephrine (LEVOPHED) Adult infusion Stopped (12/07/15 1051)   PRN Meds:.sodium chloride, morphine injection, morphine injection, sodium chloride flush    Objective: Weight change: -4 lb 3 oz (-1.9 kg)  Intake/Output Summary (Last 24 hours) at 12/07/15 1901 Last data filed at 12/07/15 1800  Gross per 24 hour  Intake 3517.54 ml  Output    410 ml  Net 3107.54 ml   Blood pressure 82/41, pulse 68, temperature 98.3 F (36.8 C), temperature source Oral, resp. rate 16, height 5' (1.524 m), weight 105 lb 6.1 oz (47.8 kg), SpO2 98 %. Temp:  [97.8 F (36.6 C)-98.9 F (37.2 C)] 98.3 F (36.8 C) (05/07 1552) Pulse Rate:  [66-84] 68 (05/07 1800) Resp:  [13-27] 16 (05/07 1800) BP: (77-115)/(41-67) 82/41 mmHg (05/07  1800) SpO2:  [87 %-99 %] 98 % (05/07 1800) FiO2 (%):  [40 %-50 %] 50 % (05/07 1638) Weight:  [105 lb 6.1 oz (47.8 kg)] 105 lb 6.1 oz (47.8 kg) (05/07 0500)  Physical Exam: General: awake tracks HEENT: Tracheostomy in place CVS regular rate, normal r,  no murmur rubs or gallops Chest: Coarse breath sounds throughout Abdomen: nondistended, normal bowel sounds, Extremities: no  clubbing or edema noted bilaterally Skin: no rashes Lymph: no new lymphadenopathy Neuro: nonfocal  CBC: CBC Latest Ref Rng 12/06/2015 12/05/2015 12/04/2015  WBC 4.0 - 10.5 K/uL 35.1(H) 59.1(HH) 39.4(H)  Hemoglobin 12.0 - 15.0 g/dL 8.7(L) 7.3(L) 6.1(LL)  Hematocrit 36.0 - 46.0 % 26.3(L) 23.6(L) 20.8(L)  Platelets 150 - 400 K/uL 596(H) 653(H) 729(H)       BMET  Recent Labs  12/05/15 0400 12/06/15 0533  NA 139 141  K 5.3* 4.1  CL 90* 95*  CO2 34* 33*  GLUCOSE 254* 184*  BUN 164* 141*  CREATININE 1.66* 1.38*  CALCIUM 8.1* 7.5*     Liver Panel   Recent Labs  12/04/15 1928 12/06/15 0533  PROT 6.3* 5.5*  ALBUMIN 1.2* 1.1*  AST 26 21  ALT 14 15  ALKPHOS 240* 100  BILITOT 0.5 0.5       Sedimentation Rate No results for input(s): ESRSEDRATE in the last 72 hours. C-Reactive Protein No results for input(s): CRP in the last 72 hours.  Micro Results: Recent Results (from the past 720 hour(s))  Blood Culture (routine x 2)     Status: Abnormal (Preliminary result)   Collection Time: 12/04/15  7:28 PM  Result Value Ref Range Status   Specimen Description BLOOD PICC LINE  Final   Special Requests BOTTLES DRAWN AEROBIC AND ANAEROBIC 5CC  Final   Culture  Setup Time   Final    Organism ID to follow GRAM POSITIVE COCCI GRAM NEGATIVE RODS IN BOTH AEROBIC AND ANAEROBIC BOTTLES CRITICAL RESULT CALLED TO, READ BACK BY AND VERIFIED WITH: C. STEWART, PHARM D AT [place] ON [id number] BY S. YARBROUGH    Culture (A)  Final    KLEBSIELLA PNEUMONIAE SUSCEPTIBILITIES TO FOLLOW PSEUDOMONAS AERUGINOSA     Report Status PENDING  Incomplete  Blood Culture (routine x 2)     Status: Abnormal (Preliminary result)   Collection Time: 12/04/15  7:28 PM  Result Value Ref Range Status   Specimen Description BLOOD PICC LINE  Final   Special Requests BOTTLES DRAWN AEROBIC AND ANAEROBIC 5CC  Final   Culture  Setup Time   Final  GRAM POSITIVE COCCI GRAM NEGATIVE RODS IN BOTH AEROBIC AND ANAEROBIC BOTTLES CRITICAL RESULT CALLED TO, READ BACK BY AND VERIFIED WITH: C. Nicole [facility], PHARM D AT [place] ON [id number] BY Rhea Bleacher    Culture KLEBSIELLA PNEUMONIAE PSEUDOMONAS AERUGINOSA  (A)  Final   Report Status PENDING  Incomplete  Blood Culture ID Panel (Reflexed)     Status: Abnormal   Collection Time: 12/04/15  7:28 PM  Result Value Ref Range Status   Enterococcus species DETECTED (A) NOT DETECTED Final    Comment: CRITICAL RESULT CALLED TO, READ BACK BY AND VERIFIED WITH: C. STEWART, PHARM D AT 0855 ON [id number] BY S. YARBROUGH    Vancomycin resistance NOT DETECTED NOT DETECTED Final   Listeria monocytogenes NOT DETECTED NOT DETECTED Final   Staphylococcus species NOT DETECTED NOT DETECTED Final   Staphylococcus aureus NOT DETECTED NOT DETECTED Final   Methicillin resistance NOT DETECTED NOT DETECTED Final   Streptococcus species NOT DETECTED NOT DETECTED Final   Streptococcus agalactiae NOT DETECTED NOT DETECTED Final   Streptococcus pneumoniae NOT DETECTED NOT DETECTED Final   Streptococcus pyogenes NOT DETECTED NOT DETECTED Final   Acinetobacter baumannii NOT DETECTED NOT DETECTED Final   Enterobacteriaceae species NOT DETECTED NOT DETECTED Final   Enterobacter cloacae complex NOT DETECTED NOT DETECTED Final   Escherichia coli NOT DETECTED NOT DETECTED Final   Klebsiella oxytoca NOT DETECTED NOT DETECTED Final   Klebsiella pneumoniae DETECTED (A) NOT DETECTED Final    Comment: CRITICAL RESULT CALLED TO, READ BACK BY AND VERIFIED WITH: C. STEWART,PHARM D AT 0855 ON [id number] BY S. YARBROUGH    Proteus  species NOT DETECTED NOT DETECTED Final   Serratia marcescens NOT DETECTED NOT DETECTED Final   Carbapenem resistance DETECTED (A) NOT DETECTED Final    Comment: CRITICAL RESULT CALLED TO, READ BACK BY AND VERIFIED WITH: C. STEWART,PHARM D AT 0855 ON [id number] BY S. YARBROUGH AND NOTIFIED NATALIE DAVIS IN INFECTION CONTROL AT 0900 ON [id number] BY S. YARBROUGH    Haemophilus influenzae NOT DETECTED NOT DETECTED Final   Neisseria meningitidis NOT DETECTED NOT DETECTED Final   Pseudomonas aeruginosa DETECTED (A) NOT DETECTED Final    Comment: CRITICAL RESULT CALLED TO, READ BACK BY AND VERIFIED WITH: CLamont Snowball D AT 0855 ON [id number] BY S. YARBROUGH    Candida albicans NOT DETECTED NOT DETECTED Final   Candida glabrata NOT DETECTED NOT DETECTED Final   Candida krusei NOT DETECTED NOT DETECTED Final   Candida parapsilosis NOT DETECTED NOT DETECTED Final   Candida tropicalis NOT DETECTED NOT DETECTED Final  Urine culture     Status: None   Collection Time: 12/04/15  7:48 PM  Result Value Ref Range Status   Specimen Description URINE, CATHETERIZED  Final   Special Requests NONE  Final   Culture NO GROWTH 1 DAY  Final   Report Status 12/06/2015 FINAL  Final  MRSA PCR Screening     Status: Abnormal   Collection Time: 12/05/15 12:04 AM  Result Value Ref Range Status   MRSA by PCR POSITIVE (A) NEGATIVE Final    Comment:        The GeneXpert MRSA Assay (FDA approved for NASAL specimens only), is one component of a comprehensive MRSA colonization surveillance program. It is not intended to diagnose MRSA infection nor to guide or monitor treatment for MRSA infections. RESULT CALLED TO, READ BACK BY AND VERIFIED WITH: Filbert Berthold RN 10:40 12/05/15 ([email or web address])   Wound culture     Status: None (Preliminary result)  Collection Time: 12/05/15  7:30 AM  Result Value Ref Range Status   Specimen Description WOUND  Final   Special Requests SACRAL  Final   Gram Stain PENDING  Incomplete   Culture    Final    Culture reincubated for better growth Performed at [facility]    Report Status PENDING  Incomplete  C difficile quick scan w PCR reflex     Status: Abnormal   Collection Time: 12/05/15  6:01 PM  Result Value Ref Range Status   C Diff antigen POSITIVE (A) NEGATIVE Final   C Diff toxin POSITIVE (A) NEGATIVE Final   C Diff interpretation Positive for toxigenic C. difficile  Final    Comment: CRITICAL RESULT CALLED TO, READ BACK BY AND VERIFIED WITH: Bryson Dames RN [id number] 12/05/15 A [place]   Culture, respiratory (tracheal aspirate)     Status: None (Preliminary result)   Collection Time: 12/05/15  8:53 PM  Result Value Ref Range Status   Specimen Description TRACHEAL ASPIRATE  Final   Special Requests NONE  Final   Gram Stain PENDING  Incomplete   Culture   Final    MODERATE PSEUDOMONAS AERUGINOSA Performed at [facility]    Report Status PENDING  Incomplete    Studies/Results: Dg Chest Port 1 View  12/07/2015  CLINICAL DATA:  Patient with history of pneumonia, COPD and diabetes. EXAM: PORTABLE CHEST 1 VIEW COMPARISON:  Chest radiograph 12/06/2015. FINDINGS: Tracheostomy tube terminates in the mid trachea. The cup appears to be hyperinflated. Left IJ central venous catheter tip projects over the superior vena cava. Multiple monitoring leads overlie the patient. Stable cardiac and mediastinal contours. Interval decrease in size of now small left pleural effusion. Persistent heterogeneous opacities left lung base and left apex. No pneumothorax. IMPRESSION: Persistent overinflation of the tracheostomy cuff. Interval decrease in size of left pleural effusion with persistent left lower and upper lung airspace opacities. Electronically Signed   By: Lovey Newcomer M.D.   On: 12/07/2015 08:07   Dg Chest Port 1 View  12/06/2015  CLINICAL DATA:  Acute respiratory failure EXAM: PORTABLE CHEST 1 VIEW COMPARISON:  Yesterday FINDINGS: Tracheostomy tube remains seated. The trachea is  likely distended by the cuff. Left IJ central line with tip at the SVC level. The left upper extremity PICC is been removed. Progressive opacification of the left chest with worsened volume loss, likely interval lower lobe collapse. Left pleural effusion which could be loculated. COPD. These results will be called to the ordering clinician or representative by the Radiologist Assistant, and communication documented in the PACS or zVision Dashboard. IMPRESSION: 1. Tracheostomy tube with widened trachea suggesting cuff over inflation. 2. Interval collapse of the left lower lobe and increased pleural effusion, possibly loculated. History suggests underlying pneumonia and parapneumonic effusion. 3. COPD. Electronically Signed   By: Monte Fantasia M.D.   On: 12/06/2015 07:29      Assessment/Plan:  INTERVAL HISTORY:   Polymicrobial cultures still incubating for Sensis  CDI + by antigen and toxin  11/07/15:    Active Problems:   Sepsis ([place])   Encounter for central line placement   Clostridium difficile colitis   CRKP (carbapenem-resistant Klebsiella pneumoniae) infection   Palliative care encounter   Acute respiratory failure ([place])    Kristen Arnold is a 70 y.o. female with  Polymicrobial bacteremia with Enterococcus, CRE klebsiella, pseudomonas in blood with sepsis now with CDI  #1 Polymicrobial bacteremia continue Avycaz, vanco, flagyl  #2 CDI: she has no  access for enteric drugs so only using IV flagyl at present  Prognosis is abysmal.     LOS: 3 days   Kristen Arnold 12/07/2015, 7:01 PM

## 2015-12-07 NOTE — Progress Notes (Signed)
PULMONARY / CRITICAL CARE MEDICINE   Name: Kristen ChessJudy Don MRN: [id number] DOB: 08/29/1945    ADMISSION DATE:  12/04/2015 CONSULTATION DATE:  12/04/15  REFERRING MD:  EDP  CHIEF COMPLAINT:  AMS  SUBJECTIVE:  leve remain slow but improved, peep 10   VITAL SIGNS: BP 115/63 mmHg  Pulse 80  Temp(Src) 98.6 F (37 C) (Oral)  Resp 21  Ht 5' (1.524 m)  Wt 47.8 kg (105 lb 6.1 oz)  BMI 20.58 kg/m2  SpO2 95%  HEMODYNAMICS:    VENTILATOR SETTINGS: Vent Mode:  [-] PRVC FiO2 (%):  [40 %-50 %] 50 % Set Rate:  [16 bmp] 16 bmp Vt Set:  [480 mL] 480 mL PEEP:  [8 cmH20-10 cmH20] 8 cmH20 Plateau Pressure:  [17 cmH20-27 cmH20] 17 cmH20  INTAKE / OUTPUT: I/O last 3 completed shifts: In: 4394.4 [I.V.:3644.4; IV Piggyback:750] Out: 975 [Urine:975]   PHYSICAL EXAMINATION: General: Chronically ill appearing elderly female, cachectic, in NAD. Neuro: opens eyes to touch, not fc, contracted uppers chronic HEENT: /AT. PERRL, trach clean Cardiovascular: s1 s2 RRR  Lungs: coarse, left Abdomen: BS x 4, soft, NT/ND. J tube in place. Musculoskeletal: No gross deformities, no edema.  Skin: Intact, warm, no rashes.       LABS:  BMET  Recent Labs Lab 12/04/15 1928 12/05/15 0400 12/06/15 0533  NA 141 139 141  K 5.1 5.3* 4.1  CL 89* 90* 95*  CO2 38* 34* 33*  BUN 169* 164* 141*  CREATININE 1.51* 1.66* 1.38*  GLUCOSE 207* 254* 184*    Electrolytes  Recent Labs Lab 12/04/15 1928 12/05/15 0400 12/06/15 0533  CALCIUM 9.1 8.1* 7.5*  MG  --  2.3  --   PHOS  --  4.6  --     CBC  Recent Labs Lab 12/04/15 1928 12/05/15 0400 12/06/15 0533  WBC 39.4* 59.1* 35.1*  HGB 6.1* 7.3* 8.7*  HCT 20.8* 23.6* 26.3*  PLT 729* 653* 596*    Coag's No results for input(s): APTT, INR in the last 168 hours.  Sepsis Markers  Recent Labs Lab 12/04/15 2003 12/04/15 2230 12/05/15 1110  LATICACIDVEN 2.53* 3.4* 2.1*    ABG  Recent Labs Lab 12/04/15 1912 12/05/15 0325  PHART  7.356 7.341*  PCO2ART 82.7* 69.6*  PO2ART 72.0* 178*    Liver Enzymes  Recent Labs Lab 12/04/15 1928 12/06/15 0533  AST 26 21  ALT 14 15  ALKPHOS 240* 100  BILITOT 0.5 0.5  ALBUMIN 1.2* 1.1*    Cardiac Enzymes  Recent Labs Lab 12/05/15 0400 12/05/15 1110  TROPONINI 0.27* 0.18*    Glucose  Recent Labs Lab 12/06/15 1143 12/06/15 1625 12/06/15 1942 12/07/15 0006 12/07/15 0353 12/07/15 0818  GLUCAP 171* 182* 162* 115* 113* 120*    Imaging Dg Chest Port 1 View  12/07/2015  CLINICAL DATA:  Patient with history of pneumonia, COPD and diabetes. EXAM: PORTABLE CHEST 1 VIEW COMPARISON:  Chest radiograph 12/06/2015. FINDINGS: Tracheostomy tube terminates in the mid trachea. The cup appears to be hyperinflated. Left IJ central venous catheter tip projects over the superior vena cava. Multiple monitoring leads overlie the patient. Stable cardiac and mediastinal contours. Interval decrease in size of now small left pleural effusion. Persistent heterogeneous opacities left lung base and left apex. No pneumothorax. IMPRESSION: Persistent overinflation of the tracheostomy cuff. Interval decrease in size of left pleural effusion with persistent left lower and upper lung airspace opacities. Electronically Signed   By: Annia Beltrew  Davis M.D.   On: 12/07/2015 08:07  STUDIES:  CXR 05/04 > left HCAP  CULTURES: Blood reflex 05/04 >> klebsiella pneumonia, enterococcus, pseudomonas aeruginosa and carbapenem resistance BCx2 5/4 >> gram neg rod>>> Urine 05/04 >> Sputum 05/04 >>pseudomons aur  ANTIBIOTICS: Vanc 05/04 >> Zosyn 05/04 >> Cipro 05/04 >>  SIGNIFICANT EVENTS: 5/04  Admitted with septic shock presumed due to HCAP as well as anemia (Hgb 6.1) 5/6- collapse, peep, dnr 5/7- some improved aeration left  LINES/TUBES: Trach (chronic) >> J tube (chronic) >> L PICC (chronic) >> will remove 05/04 as it is leaking. CVL L IJ 5/04 >>  DISCUSSION: 70 y.o. F with chronic trach / J tube  from [facility], admitted 05/04 with septic shock and anemia.  ASSESSMENT / PLAN:  PULMONARY A: Chronic tracheostomy / vent dependence. LLL HCAP. Hx COPD. Cuff overdistention? New collapse / effusion / ATX left - PNA P:   Peep consider to 8, okay to 50% Unable sbt would fail  Chest pt left, consider dc after 24 hr mucomyust allow to dc in 2 more doses Bronch would be futile Continued vent is futile and medically ineffective pcxr in am   CARDIOVASCULAR A:  Septic shock - presumed due to HCAP. P:  Continue levophed as needed to maintain MAP > 60, once off, will NOT restart given family discussions  Goal to off Tele Pos balance  INFECTIOUS A:   Septic shock - presumed due to HCAP.  Hx pseudomonal and coag neg staph bacteremia last admission in Feb GNR / GPC Bacteremia  Hx C.diff. PNA left withj collapse, pseudomonas P:   Abx as above (vanc / zosyn / cipro) Follow sens pseudo ID abx  HEMATOLOGIC A:   Anemia - acute on chronic.  Hgb now down to 6.1. VTE Prophylaxis. Hx DVT. Sacral decubitus ulcer stage IV P:  SCD's / Heparin  NEUROLOGIC A:   AMS - unclear etiology, likely due to sepsis. Hx anxiety / depression, chronic pain. Pain and suffering Medically  Ineffective to have heroics P:   Monitor clinically. Limit sedating meds. Add low dose ativan, prior med sertraline. Continue Namenda Consider immediate comfort care, calling son now with pall care, I spoke to them myself 5/6  RENAL A:   AKI - in setting of shock. P:   NS @ 50 Bolus and dc pressors if map okay BMP in AM.  GASTROINTESTINAL A:   GI prophylaxis. Nutrition. P:   SUP: Pantoprazole. TF hold, peg fxn in ? Hold off replacement until d/w son comfort? No consult at this time in middle of talks for comfort  ENDOCRINE A:   DM. P:   SSI.   Family updated: pall care talking to sons, I also spoke to them./ NO escallation, she needs comfort. Continued support is futile and allowing her  to suffer with NO benefit  Interdisciplinary Family Meeting v Palliative Care Meeting:  Due by: 12/10/15.  Ccm time 30 min   Mcarthur Rossetti. Tyson Alias, MD, FACP Pgr: [phone] [facility]

## 2015-12-07 NOTE — Progress Notes (Signed)
Pts son Harvie HeckRandy called at about 1430 stating "I just got out of the shower and I was supposed to call today about my mother." Explained to Harvie HeckRandy that the Palliative Care MD had tried calling him this morning. I asked Harvie HeckRandy if it was okay to place him on hold for a couple minutes while I tried to contact the MD. Harvie HeckRandy stated this was ok. The call was placed on hold for a couple minutes while I contacted the Palliative Care NP. Palliative Care was paged around 1433 and when I returned to the call with Harvie HeckRandy the call had been disconnected.

## 2015-12-07 NOTE — Progress Notes (Signed)
Pts son Harvie HeckRandy called back around 1745. This nurse updated Harvie HeckRandy on pts status today and plan on care for today. I explained that Pallative Care wanted to talk to him about his mother and her goals of care.   We had an in depth conversation about his mothers multi-drug resistant organisms and that she is requiring full vent support and that she has continued to have problems with her breathing. Harvie HeckRandy was also updated that his mother has required prn doses of Morphine to keep her comfortable today.   Harvie HeckRandy agreed that he didn't want his mother to suffer, but also stated "I love her." Emotional support was given. I told Harvie HeckRandy that the doctors were not currently on the unit but they really wanted to speak to him about his mother. I asked Harvie HeckRandy when would be a good time for the doctors to contact him tomorrow (Monday 12/08/15). Harvie HeckRandy stated "Anytime. I just got out of the hospital myself and have been sleeping a lot." Harvie HeckRandy seemed to understand that his mother hasn't been well in a long time and even stated "I don't want her to suffer."  I encouraged Harvie HeckRandy to try and be available for a phone calls from the doctors tomorrow that they would be reaching out to him again.

## 2015-12-07 NOTE — Consult Note (Signed)
                                                                                 Consultation Note Date: 12/07/2015   Patient Name: Kristen Arnold  DOB: 11/27/1945  MRN: [id number]  Age / Sex: 69 y.o., female  PCP: McKay Crowley, MD Referring Physician: Rakesh Alva V, MD  Reason for Consultation: Establishing goals of care, Family-Clinician negotiation, Hospice Evaluation, Psychosocial/spiritual support and Withdrawal of life-sustaining treatment  HPI/Patient Profile: 69 y.o. female  with past medical history of acute on chronic resp failure, COPD, DM, protein calorie malnutrition,dementia,  hemicolectomy for SBO in Dec 2016 requiring prolonged ventilation and was subsequently trached and PEG placed. She was hospitalized in 2/17 for asp pna; she also has a h/o c.diff; on 12/04/2015 pt admitted from [facility] after being unresponsive for 24 hours, hypotensive and hgb of 6.1. CXR revealed LLL airspace disease. She has multi-drug resistant organism positive bld cx. She has required full vent support here as well as at [facility], and despite abx, full aggressive care, pt has cont to decline as evidenced by new collapse of LLL. Leukocytosis has persisted and WBC 5/6 35.1 ( down from 59 ).  Clinical Assessment and Goals of Care: Pt is eldlery vent dependant female now with new collapse of LLL as well as multi-drug resistant PNA, She is cachetic with temporal wasting. No ability to communicate needs. She is on pressors with difiiculty weaning  NEXT OF KIN. Pt has 2 sons and [place] D[place] has been VERY DIFFICULT to reach.. Arranged conference call for 10am 5/7 but Randy Derr did not answer x 3 at number [phone]; 2x [phone];  [phone] x 2; son, Jason Derr [phone] reached  Per nursing later in the day, Randy called the unit about 2pm stating he "was in the shower" , she asked him to hold while she reached Dr. Feinstein but he hung up before she could do this . I called again at 1720 to number  [phone] and voice mail now set up; LM. His brother also stated he was going to try and reach him    SUMMARY OF RECOMMENDATIONS   Per conversation with one of patient's sons, Jason Derr, Jason shares that both he and his brother have heard in the past their mother state that she would not be want to kept alive on life support We discussed terminal extubation and he was in agreement I did ask him if he could continue and try to reach his brother and he stated that he would. I also stated that the palliative medicine team would continue to try to reach him Code Status/Advance Care Planning:  DNR    Symptom Management:   Pain: Continue with fentanyl when necessary  Dyspnea: Remains on the ventilator with full support at this point ;compassionate weaning the neck recommended by critical care medicine  Palliative Prophylaxis:   Bowel Regimen, Delirium Protocol, Frequent Pain Assessment, Palliative Wound Care and Turn Reposition   Psycho-social/Spiritual:   Desire for further Chaplaincy support:no  Prognosis:   < 4 weeks even in the setting of continued ventilator support to trach, patient not responding to antibiotics, continued leukocytosis, and new collapse of   left lower lobe  Discharge Planning: To Be Determined      Primary Diagnoses: Present on Admission:  . Sepsis (HCC)  I have reviewed the medical record, interviewed the patient and family, and examined the patient. The following aspects are pertinent.  Past Medical History  Diagnosis Date  . Bowel obstruction (HCC)     colectomy 06/2015  . Anemia   . COPD (chronic obstructive pulmonary disease) (HCC)   . Dementia   . Acute on chronic respiratory failure (HCC)   . Anxiety   . Depression   . Diabetes mellitus without complication (HCC)   . DVT (deep venous thrombosis) (HCC)   . Recurrent Clostridium difficile diarrhea   . History of Pseudomonas pneumonia   . Severe protein-calorie malnutrition (HCC)   .  Chronic pain    Social History   Social History  . Marital Status: Unknown    Spouse Name: N/A  . Number of Children: N/A  . Years of Education: N/A   Social History Main Topics  . Smoking status: Former Smoker    Types: Cigarettes  . Smokeless tobacco: Never Used  . Alcohol Use: No  . Drug Use: No  . Sexual Activity: No   Other Topics Concern  . None   Social History Narrative   History reviewed. No pertinent family history. Scheduled Meds: . sodium chloride   Intravenous Once  . acetylcysteine  4 mL Nebulization BID  . albuterol  2.5 mg Nebulization Q12H  . antiseptic oral rinse  7 mL Mouth Rinse QID  . ceftazidime avibactam (AVYCAZ) IVPB  0.94 g Intravenous Q12H  . chlorhexidine gluconate (SAGE KIT)  15 mL Mouth Rinse BID  . Chlorhexidine Gluconate Cloth  6 each Topical Q0600  . collagenase   Topical Daily  . heparin  5,000 Units Subcutaneous Q8H  . insulin aspart  0-9 Units Subcutaneous Q4H  . memantine  5 mg Per Tube BID  . metronidazole  500 mg Intravenous Q8H  . mupirocin ointment  1 application Nasal BID  . pantoprazole (PROTONIX) IV  40 mg Intravenous QHS  . sodium chloride flush  10-40 mL Intracatheter Q12H  . vancomycin  500 mg Oral Q6H  . vancomycin  500 mg Intravenous Q24H   Continuous Infusions: . sodium chloride 75 mL/hr at 12/07/15 0900  . feeding supplement (VITAL 1.5 CAL) Stopped (12/05/15 1450)  . norepinephrine (LEVOPHED) Adult infusion 2 mcg/min (12/07/15 0900)   PRN Meds:.sodium chloride, morphine injection, morphine injection, sodium chloride flush Medications Prior to Admission:  Prior to Admission medications   Medication Sig Start Date End Date Taking? Authorizing Provider  acetaminophen (TYLENOL) 325 MG tablet 650 mg by PEG Tube route every 4 (four) hours as needed for moderate pain.   Yes Historical Provider, MD  carvedilol (COREG) 3.125 MG tablet Take 3.125 mg by mouth 2 (two) times daily with a meal.   Yes Historical Provider, MD    cefTRIAXone 1 g in dextrose 5 % 50 mL Inject 1 g into the vein daily. 1 gram IV every 24 hours for 7 days for UTI   Yes Historical Provider, MD  chlorhexidine (PERIDEX) 0.12 % solution Use as directed 15 mLs in the mouth or throat 2 (two) times daily.   Yes Historical Provider, MD  insulin regular (NOVOLIN R,HUMULIN R) 100 units/mL injection Inject 5 Units into the skin 4 (four) times daily.   Yes Historical Provider, MD  ipratropium-albuterol (DUONEB) 0.5-2.5 (3) MG/3ML SOLN Take 3 mLs by nebulization every 6 (  six) hours.   Yes Historical Provider, MD  LORazepam (ATIVAN) 0.5 MG tablet Take 0.5 mg by mouth every 8 (eight) hours. Take 1.5 tablets as needed every 8 hours as needed for anxiety.   Yes Historical Provider, MD  magnesium oxide (MAG-OX) 400 MG tablet 400 mg by PEG Tube route 3 (three) times daily.   Yes Historical Provider, MD  memantine (NAMENDA) 5 MG tablet Take 5 mg by mouth 2 (two) times daily.   Yes Historical Provider, MD  metoCLOPramide (REGLAN) 5 MG tablet 5 mg by PEG Tube route every 6 (six) hours.   Yes Historical Provider, MD  Nutritional Supplements (FEEDING SUPPLEMENT, VITAL AF 1.2 CAL,) LIQD Place 1,000 mLs into feeding tube daily. 10/01/15  Yes Erick Colace, NP  pantoprazole (PROTONIX) 40 MG tablet 40 mg by PEG Tube route 2 (two) times daily.   Yes Historical Provider, MD  sertraline (ZOLOFT) 50 MG tablet Take 50 mg by mouth daily.   Yes Historical Provider, MD  vitamin C (ASCORBIC ACID) 500 MG tablet 500 mg by PEG Tube route daily.   Yes Historical Provider, MD  ciprofloxacin (CIPRO) 400 MG/200ML SOLN Inject 200 mLs (400 mg total) into the vein every 8 (eight) hours. 10/01/15   Erick Colace, NP  Dextrose-Sodium Chloride (DEXTROSE 5 % AND 0.45% NACL) infusion At 75 ml/hr 10/01/15   Erick Colace, NP  fluconazole (DIFLUCAN) 100-0.9 MG/50ML-% SOLN IVPB Inject 50 mLs (100 mg total) into the vein daily. 10/01/15   Erick Colace, NP  vancomycin (VANCOCIN) 1 GM/200ML SOLN Inject  200 mLs (1,000 mg total) into the vein daily. 10/01/15   Erick Colace, NP  vancomycin (VANCOCIN) 50 mg/mL oral solution Take 10 mLs (500 mg total) by mouth every 6 (six) hours. 10/01/15   Erick Colace, NP   Allergies  Allergen Reactions  . Codeine     Unknown reaction, listed on MAR   Review of Systems  Unable to perform ROS: Intubated    Physical Exam  Constitutional:  Cachetic, frail acutely ill elderly female  HENT:  Temporal wasting  Neck:  trach  Cardiovascular:  Hypotensive. On pressors  Pulmonary/Chest:  Trach; vent dependant  Neurological:  Her eyes are open but she is unable to shake her head yes/no; no attempts to mouth words  Skin: Skin is warm.  Nursing note and vitals reviewed.   Vital Signs: BP 108/58 mmHg  Pulse 80  Temp(Src) 98.6 F (37 C) (Oral)  Resp 21  Ht 5' (1.524 m)  Wt 47.8 kg (105 lb 6.1 oz)  BMI 20.58 kg/m2  SpO2 96% Pain Assessment: CPOT   Pain Score: Asleep   SpO2: SpO2: 96 % O2 Device:SpO2: 96 % O2 Flow Rate: .   IO: Intake/output summary:  Intake/Output Summary (Last 24 hours) at 12/07/15 0942 Last data filed at 12/07/15 0901  Gross per 24 hour  Intake 2996.01 ml  Output    435 ml  Net 2561.01 ml    LBM: Last BM Date: 12/06/15 Baseline Weight: Weight: 45.36 kg (100 lb) Most recent weight: Weight: 47.8 kg (105 lb 6.1 oz)     Palliative Assessment/Data:   Flowsheet Rows        Most Recent Value   Intake Tab    Referral Department  Critical care   Unit at Time of Referral  ICU   Palliative Care Primary Diagnosis  Sepsis/Infectious Disease   Date Notified  12/05/15   Palliative Care Type  New Palliative care  Reason for referral  Clarify Goals of Care   Date of Admission  12/04/15   Date first seen by Palliative Care  12/07/15   # of days Palliative referral response time  2 Day(s)   # of days IP prior to Palliative referral  1   Clinical Assessment    Palliative Performance Scale Score  20%   Pain Max last 24  hours  Not able to report   Pain Min Last 24 hours  Not able to report   Dyspnea Max Last 24 Hours  Not able to report   Dyspnea Min Last 24 hours  Not able to report   Nausea Max Last 24 Hours  Not able to report   Nausea Min Last 24 Hours  Not able to report   Anxiety Max Last 24 Hours  Not able to report   Anxiety Min Last 24 Hours  Not able to report   Other Max Last 24 Hours  Not able to report   Psychosocial & Spiritual Assessment    Palliative Care Outcomes       Time In: 0945 Time Out: 1100 Time Total: 75 min Greater than 50%  of this time was spent counseling and coordinating care related to the above assessment and plan. Staffed with Dr. Titus Mould  Signed by: Dory Horn, NP   Please contact Palliative Medicine Team phone at [phone] for questions and concerns.  For individual provider: See Shea Evans

## 2015-12-08 DIAGNOSIS — Z1624 Resistance to multiple antibiotics: Secondary | ICD-10-CM

## 2015-12-08 LAB — CULTURE, RESPIRATORY W GRAM STAIN

## 2015-12-08 LAB — GLUCOSE, CAPILLARY
GLUCOSE-CAPILLARY: 94 mg/dL (ref 65–99)
GLUCOSE-CAPILLARY: 85 mg/dL (ref 65–99)
Glucose-Capillary: 86 mg/dL (ref 65–99)
Glucose-Capillary: 91 mg/dL (ref 65–99)
Glucose-Capillary: 85 mg/dL (ref 65–99)

## 2015-12-08 MED ORDER — FAMOTIDINE 40 MG/5ML PO SUSR
20.0000 mg | Freq: Two times a day (BID) | ORAL | Status: DC
  Filled 2015-12-08: qty 2.5

## 2015-12-08 MED ORDER — GENTAMICIN SULFATE 40 MG/ML IJ SOLN
360.0000 mg | Freq: Once | INTRAMUSCULAR | Status: AC
  Administered 2015-12-08: 360 mg via INTRAVENOUS
  Filled 2015-12-08: qty 9

## 2015-12-08 NOTE — Progress Notes (Signed)
PULMONARY / CRITICAL CARE MEDICINE   Name: Kristen Arnold MRN: [id number] DOB: 05/09/1946    ADMISSION DATE:  12/04/2015 CONSULTATION DATE:  12/04/15  REFERRING MD:  EDP  CHIEF COMPLAINT:  AMS  70 year old woman transferred from [facility] due to being unresponsive for 24 hours. She was noted to be hypotensive with a hemoglobin of 6.1, chest x-ray showed a left lower lobe airspace disease. She had right hemicolectomy for small bowel obstruction in December 2016 and required prolonged mechanical ventilation and tracheostomy/ PEG placement. She was hospitalized in 09/2015 for aspiration pneumonia-blood cultures positive for coag-negative staph and Pseudomonas-related to pneumonia/ HCAP. she also has a history of recurrent Clostridium difficile diarrhea and severe protein calorie malnutrition    Subjective-  Off Levophed drip Does not appear to be in pain  VITAL SIGNS: BP 101/57 mmHg  Pulse 76  Temp(Src) 98.5 F (36.9 C) (Oral)  Resp 19  Ht 5' (1.524 m)  Wt 111 lb 15.9 oz (50.8 kg)  BMI 21.87 kg/m2  SpO2 94%  HEMODYNAMICS:    VENTILATOR SETTINGS: Vent Mode:  [-] PRVC FiO2 (%):  [50 %] 50 % Set Rate:  [16 bmp] 16 bmp Vt Set:  [480 mL] 480 mL PEEP:  [8 cmH20] 8 cmH20 Plateau Pressure:  [20 cmH20-25 cmH20] 20 cmH20  INTAKE / OUTPUT: I/O last 3 completed shifts: In: 4642.5 [I.V.:3492.5; IV Piggyback:1150] Out: 1210 [Urine:1210]   PHYSICAL EXAMINATION: General: Chronically ill appearing elderly female, cachectic, in NAD. Neuro: opens eyes to touch, not fc, contracted uppers chronic HEENT: [place]/AT. PERRL, trach clean Cardiovascular: s1 s2 RRR  Lungs: coarse, left Abdomen: BS x 4, soft, NT/ND. J tube in place. Musculoskeletal: No gross deformities, no edema.  Skin: Intact, warm, no rashes.   LABS:  BMET  Recent Labs Lab 12/04/15 1928 12/05/15 0400 12/06/15 0533  NA 141 139 141  K 5.1 5.3* 4.1  CL 89* 90* 95*  CO2 38* 34* 33*  BUN 169* 164* 141*  CREATININE 1.51*  1.66* 1.38*  GLUCOSE 207* 254* 184*    Electrolytes  Recent Labs Lab 12/04/15 1928 12/05/15 0400 12/06/15 0533  CALCIUM 9.1 8.1* 7.5*  MG  --  2.3  --   PHOS  --  4.6  --     CBC  Recent Labs Lab 12/04/15 1928 12/05/15 0400 12/06/15 0533  WBC 39.4* 59.1* 35.1*  HGB 6.1* 7.3* 8.7*  HCT 20.8* 23.6* 26.3*  PLT 729* 653* 596*    Coag's No results for input(s): APTT, INR in the last 168 hours.  Sepsis Markers  Recent Labs Lab 12/04/15 2003 12/04/15 2230 12/05/15 1110  LATICACIDVEN 2.53* 3.4* 2.1*    ABG  Recent Labs Lab 12/04/15 1912 12/05/15 0325  PHART 7.356 7.341*  PCO2ART 82.7* 69.6*  PO2ART 72.0* 178*    Liver Enzymes  Recent Labs Lab 12/04/15 1928 12/06/15 0533  AST 26 21  ALT 14 15  ALKPHOS 240* 100  BILITOT 0.5 0.5  ALBUMIN 1.2* 1.1*    Cardiac Enzymes  Recent Labs Lab 12/05/15 0400 12/05/15 1110  TROPONINI 0.27* 0.18*    Glucose  Recent Labs Lab 12/07/15 1148 12/07/15 1554 12/07/15 1933 12/08/15 0413 12/08/15 0759 12/08/15 1117  GLUCAP 115* 108* 96 94 85 86    Imaging No results found. STUDIES:  CXR 05/04 > left HCAP  CULTURES: Blood reflex 05/04 >> klebsiella pneumonia, enterococcus, pseudomonas aeruginosa and carbapenem resistance BCx2 5/4 >> gram neg rod>>> Urine 05/04 >> Sputum 05/04 >>pseudomons aur  ANTIBIOTICS: Vanc 05/04 >>  Zosyn 05/04 >>off Cipro 05/04 >>off Flagyl 5/5>> Ceftaz/ avibactam 5/5 >>  SIGNIFICANT EVENTS: 5/04  Admitted with septic shock presumed due to HCAP as well as anemia (Hgb 6.1) 5/6- collapse, peep, dnr 5/7- some improved aeration left  LINES/TUBES: Trach (chronic) >> J tube (chronic) >> L PICC (chronic) >> will remove 05/04 as it is leaking. CVL L IJ 5/04 >>  DISCUSSION: 70 y.o. F with chronic trach / J tube from [facility], admitted 05/04 with septic shock and anemia.  ASSESSMENT / PLAN:  PULMONARY A: Chronic tracheostomy / vent dependence. LLL HCAP. Hx  COPD. Cuff overdistention? New collapse / effusion / ATX left - PNA P:   Peep to 5   CARDIOVASCULAR A:  Septic shock - presumed due to HCAP. P:  Off  levophed , will NOT restart given family discussions    INFECTIOUS A:   Septic shock - presumed due to HCAP.  Hx pseudomonal and coag neg staph bacteremia last admission in Feb GNR / GPC Bacteremia  Hx C.diff. PNA left withj collapse, pseudomonas P:   Abx as above per ID   HEMATOLOGIC A:   Anemia - acute on chronic.  Hgb now down to 6.1. VTE Prophylaxis. Hx DVT. Sacral decubitus ulcer stage IV P:  SCD's / Heparin  NEUROLOGIC A:   AMS - unclear etiology, likely due to sepsis. Hx anxiety / depression, chronic pain. Pain and suffering Medically  Ineffective to have heroics P:   Add low dose ativan, prior med sertraline. Continue Namenda   RENAL A:   AKI - in setting of shock. P:   NS @ 50   GASTROINTESTINAL A:   GI prophylaxis. Protein cal -malnutrition. P:   SUP: Pantoprazole. Hold off PEJ replacement  ENDOCRINE A:   DM. P:   SSI.   Family updated: pall care talking to sons, I also spoke to them./ NO escallation,  Comfort planned  Interdisciplinary Family Meeting v Palliative Care Meeting:  Done, 5/7  [facility] M available as needed  Cyril Mourning MD. [facility]. [place] [facility] Pager [phone] If no response call [phone]   12/08/2015

## 2015-12-08 NOTE — Progress Notes (Signed)
Pharmacy Antibiotic Note  Kristen Arnold is a 70 y.o. female admitted on 12/04/2015 with AMS and unresponsiveness.  Pharmacy has been consulted for Avycaz, vancomycin, and gentamicin dosing.  Pt from [facility] with CRE (carbapenemase resistant Enterbacteriacae) Kleb pneumo, Pseudomonas, and Enterococcus bacteremia.  Also found with Pseudomonas pneumonia and sacral decubitus ulcer. Susceptibilities reveal that the CRE is pan resistant, the Pseudomonas in her bloodstream is only susceptible to gentamicin (I to cipro), and the Pseudomonas in her trach aspirate culture is only susceptible to cipro and gentamicin.  She is on D#4 of Avycaz (ceftazidime-avibactam) for the CRE kleb pneumo, which is very reliable for CRE infections. However, according to recent literature, there is only a ~50% chance that the [name] will cover the resistant pseudomonas.  Thus, we will add on gentamicin per discussion with ID - Dr. Ninetta LightsHatcher.  Will give a one time dose of gentamicin at 7 mg/kg and check a 10-hr level to ensure no toxicity and to decide the right interval for her.   Of note, Enterococcus was isolated on BCID but not resulted in culture yet.  Called micro lab - Molli HazardMatthew said there were too many GNR in the culture to isolate the Enterococcus (which is a GPC). Will hopefully have that isolated tomorrow.  Plan: - Continue Vancomycin 500 mg IV q24h - Continue Avycaz 0.94 gm IV q12h - Give gentamicin 360 mg IV x 1  - Check a 10-hr gent level - Goal gent trough <0.5-1 - F/u decisions about comfort care  Height: 5' (152.4 cm) Weight: 111 lb 15.9 oz (50.8 kg) IBW/kg (Calculated) : 45.5  Temp (24hrs), Avg:98.1 F (36.7 C), Min:97.5 F (36.4 C), Max:98.5 F (36.9 C)   Recent Labs Lab 12/04/15 1928 12/04/15 2003 12/04/15 2230 12/05/15 0400 12/05/15 1110 12/06/15 0533  WBC 39.4*  --   --  59.1*  --  35.1*  CREATININE 1.51*  --   --  1.66*  --  1.38*  LATICACIDVEN  --  2.53* 3.4*  --  2.1*  --     Estimated  Creatinine Clearance: 27.2 mL/min (by C-G formula based on Cr of 1.38).    Allergies  Allergen Reactions  . Codeine     Unknown reaction, listed on MAR    Antimicrobials this admission: Zosyn 5/4 >> 5/5 Cipro 5/5 >> 5/5 Vancomycin 5/4 >> Avycaz 5/5 >> Gentamicin 5/8 >>  Dose adjustments this admission: Will get 10-hr gent level  Microbiology results: 5/4 BCx: - CRE Kleb pneumo (pan R) - Pseudomonas (R to cefepime, ceftazidime, imipenem, I to cipro, S to gent) - Enterococcus (Sensitivities pending) 5/5 TA - Pseudomonas (S to cipro and gentamicin)   Thank you for allowing pharmacy to be a part of this patient's care.  Lanie Schelling L. Roseanne RenoStewart, PharmD PGY2 Infectious Diseases Pharmacy Resident Pager: [phone] 12/08/2015 2:54 PM

## 2015-12-08 NOTE — Progress Notes (Signed)
CSW consult acknowledged re: Patient from [facility]. CSW following for disposition and goals of care.      Lance MussAshley Gardner,MSW, LCSW [facility]M Clinical Social Worker [phone]

## 2015-12-08 NOTE — Progress Notes (Addendum)
Unable to contact Randy(son) X2 today. Spoke with Son Barbara CowerJason this evening. I explained that everything possible has been done to help his mother but in the setting of her resistant infections and irreversible disease and poor QOL that she would likely not survive this hospitalization-that any interventions at this juncture may just be prolonging her death and contributing to her suffering. Son Barbara CowerJason endorses full comfort care. I explained that the medical team felt it would be appropriate to allow for a natural death to occur by discontinuing mechanical ventilation and keeping her comfortable with sedation and pain medication. He agrees that this is what he would want and the wants her suffering to end. The conflict currently is that he nor any of his other family have financial resources to travel here. He tells me he is trying to make arrangements but knows she may die before that can happen. He was very depressed sounding over the phone and has his own health problems that he is dealing with-I do not believe he will make the trip. He is ok not seeing her before she dies- it causes sadness for him clearly but in general it sounds like his struggles are serious enough to prevent his travel- unfortunately I am not aware of any resources that could help him.   Would advise medical team to focus on comfort care and allow for a natural death to occur including removal of mechanical ventilation and pressors and that any additional interventions geared towards sustaining her life are at best futile measures and not inline with comfort as desired by the available family.  Time: 9AM-9:35AM Total Time: 35 min Greater than 50%  of this time was spent counseling and coordinating care related to the above assessment and plan.  Anderson MaltaElizabeth Annagrace Carr, DO Palliative Medicine [phone]

## 2015-12-08 NOTE — Progress Notes (Signed)
INFECTIOUS DISEASE PROGRESS NOTE  ID: Kristen Arnold is a 70 y.o. female with  Active Problems:   Sepsis ([place])   Encounter for central line placement   Clostridium difficile colitis   CRKP (carbapenem-resistant Klebsiella pneumoniae) infection   Palliative care encounter   Acute respiratory failure ([place])  Subjective: Responds to voice but does not follow commands.   Abtx:  Anti-infectives    Start     Dose/Rate Route Frequency Ordered Stop   12/06/15 2200  levofloxacin (LEVAQUIN) IVPB 500 mg  Status:  Discontinued     500 mg 100 mL/hr over 60 Minutes Intravenous Every 48 hours 12/04/15 2229 12/04/15 2229   12/06/15 1600  vancomycin (VANCOCIN) 50 mg/mL oral solution 500 mg     500 mg Oral Every 6 hours 12/06/15 1451 12/20/15 1159   12/06/15 1445  vancomycin (VANCOCIN) 50 mg/mL oral solution 500 mg  Status:  Discontinued     500 mg Oral Every 6 hours 12/06/15 1436 12/06/15 1451   12/05/15 2000  vancomycin (VANCOCIN) 500 mg in sodium chloride 0.9 % 100 mL IVPB     500 mg 100 mL/hr over 60 Minutes Intravenous Every 24 hours 12/04/15 2229     12/05/15 1800  vancomycin (VANCOCIN) 50 mg/mL oral solution 500 mg  Status:  Discontinued     500 mg Oral Every 6 hours 12/05/15 1531 12/05/15 1606   12/05/15 1700  metroNIDAZOLE (FLAGYL) IVPB 500 mg     500 mg 100 mL/hr over 60 Minutes Intravenous Every 8 hours 12/05/15 1606     12/05/15 1100  ceftazidime-avibactam (AVYCAZ) 0.94 g in dextrose 5 % 50 mL IVPB     0.94 g 25 mL/hr over 2 Hours Intravenous Every 12 hours 12/05/15 1008     12/05/15 1015  ceftazidime-avibactam (AVYCAZ) 0.94 g in dextrose 5 % 50 mL IVPB  Status:  Discontinued     0.94 g 25 mL/hr over 2 Hours Intravenous Every 12 hours 12/05/15 1008 12/05/15 1008   12/05/15 0400  piperacillin-tazobactam (ZOSYN) IVPB 3.375 g  Status:  Discontinued     3.375 g 12.5 mL/hr over 240 Minutes Intravenous Every 8 hours 12/04/15 2229 12/05/15 1006   12/04/15 2300  ciprofloxacin (CIPRO)  IVPB 200 mg  Status:  Discontinued     200 mg 100 mL/hr over 60 Minutes Intravenous Every 24 hours 12/04/15 2235 12/05/15 1006   12/04/15 2230  levofloxacin (LEVAQUIN) IVPB 750 mg  Status:  Discontinued     750 mg 100 mL/hr over 90 Minutes Intravenous  Once 12/04/15 2229 12/04/15 2229   12/04/15 1930  vancomycin (VANCOCIN) IVPB 1000 mg/200 mL premix     1,000 mg 200 mL/hr over 60 Minutes Intravenous  Once 12/04/15 1924 12/04/15 2041   12/04/15 1930  piperacillin-tazobactam (ZOSYN) IVPB 3.375 g     3.375 g 100 mL/hr over 30 Minutes Intravenous  Once 12/04/15 1924 12/04/15 1958      Medications:  Scheduled: . antiseptic oral rinse  7 mL Mouth Rinse QID  . ceftazidime avibactam (AVYCAZ) IVPB  0.94 g Intravenous Q12H  . chlorhexidine gluconate (SAGE KIT)  15 mL Mouth Rinse BID  . Chlorhexidine Gluconate Cloth  6 each Topical Q0600  . collagenase   Topical Daily  . famotidine  20 mg Per Tube BID  . heparin  5,000 Units Subcutaneous Q8H  . insulin aspart  0-9 Units Subcutaneous Q4H  . memantine  5 mg Per Tube BID  . metronidazole  500 mg Intravenous Q8H  .  mupirocin ointment  1 application Nasal BID  . vancomycin  500 mg Oral Q6H  . vancomycin  500 mg Intravenous Q24H    Objective: Vital signs in last 24 hours: Temp:  [97.5 F (36.4 C)-98.4 F (36.9 C)] 97.6 F (36.4 C) (05/08 0800) Pulse Rate:  [25-82] 76 (05/08 0800) Resp:  [13-24] 19 (05/08 0800) BP: (71-101)/(41-62) 101/57 mmHg (05/08 0800) SpO2:  [87 %-100 %] 94 % (05/08 0800) FiO2 (%):  [50 %] 50 % (05/08 0825) Weight:  [50.8 kg (111 lb 15.9 oz)] 50.8 kg (111 lb 15.9 oz) (05/08 0500)   General appearance: alert and no distress Resp: diminished breath sounds bilaterally Cardio: regular rate and rhythm GI: normal findings: soft, non-tender and abnormal findings:  hypoactive bowel sounds  Lab Results  Recent Labs  12/06/15 0533  WBC 35.1*  HGB 8.7*  HCT 26.3*  NA 141  K 4.1  CL 95*  CO2 33*  BUN 141*    CREATININE 1.38*   Liver Panel  Recent Labs  12/06/15 0533  PROT 5.5*  ALBUMIN 1.1*  AST 21  ALT 15  ALKPHOS 100  BILITOT 0.5   Sedimentation Rate No results for input(s): ESRSEDRATE in the last 72 hours. C-Reactive Protein No results for input(s): CRP in the last 72 hours.  Microbiology: Recent Results (from the past 240 hour(s))  Blood Culture (routine x 2)     Status: Abnormal (Preliminary result)   Collection Time: 12/04/15  7:28 PM  Result Value Ref Range Status   Specimen Description BLOOD PICC LINE  Final   Special Requests BOTTLES DRAWN AEROBIC AND ANAEROBIC 5CC  Final   Culture  Setup Time   Final    Organism ID to follow GRAM POSITIVE COCCI GRAM NEGATIVE RODS IN BOTH AEROBIC AND ANAEROBIC BOTTLES CRITICAL RESULT CALLED TO, READ BACK BY AND VERIFIED WITH: C. STEWART, PHARM D AT [place] ON [id number] BY S. YARBROUGH    Culture (A)  Final    KLEBSIELLA PNEUMONIAE SUSCEPTIBILITIES TO FOLLOW PSEUDOMONAS AERUGINOSA    Report Status PENDING  Incomplete  Blood Culture (routine x 2)     Status: Abnormal (Preliminary result)   Collection Time: 12/04/15  7:28 PM  Result Value Ref Range Status   Specimen Description BLOOD PICC LINE  Final   Special Requests BOTTLES DRAWN AEROBIC AND ANAEROBIC 5CC  Final   Culture  Setup Time   Final    GRAM POSITIVE COCCI GRAM NEGATIVE RODS IN BOTH AEROBIC AND ANAEROBIC BOTTLES CRITICAL RESULT CALLED TO, READ BACK BY AND VERIFIED WITH: C. Nicole Kindred, PHARM D AT [place] ON [phone] BY Rhea Bleacher    Culture KLEBSIELLA PNEUMONIAE PSEUDOMONAS AERUGINOSA  (A)  Final   Report Status PENDING  Incomplete  Blood Culture ID Panel (Reflexed)     Status: Abnormal   Collection Time: 12/04/15  7:28 PM  Result Value Ref Range Status   Enterococcus species DETECTED (A) NOT DETECTED Final    Comment: CRITICAL RESULT CALLED TO, READ BACK BY AND VERIFIED WITH: C. STEWART, PHARM D AT 0855 ON [phone] BY S. YARBROUGH    Vancomycin resistance NOT DETECTED NOT  DETECTED Final   Listeria monocytogenes NOT DETECTED NOT DETECTED Final   Staphylococcus species NOT DETECTED NOT DETECTED Final   Staphylococcus aureus NOT DETECTED NOT DETECTED Final   Methicillin resistance NOT DETECTED NOT DETECTED Final   Streptococcus species NOT DETECTED NOT DETECTED Final   Streptococcus agalactiae NOT DETECTED NOT DETECTED Final   Streptococcus pneumoniae NOT DETECTED NOT  DETECTED Final   Streptococcus pyogenes NOT DETECTED NOT DETECTED Final   Acinetobacter baumannii NOT DETECTED NOT DETECTED Final   Enterobacteriaceae species NOT DETECTED NOT DETECTED Final   Enterobacter cloacae complex NOT DETECTED NOT DETECTED Final   Escherichia coli NOT DETECTED NOT DETECTED Final   Klebsiella oxytoca NOT DETECTED NOT DETECTED Final   Klebsiella pneumoniae DETECTED (A) NOT DETECTED Final    Comment: CRITICAL RESULT CALLED TO, READ BACK BY AND VERIFIED WITH: C. STEWART,PHARM D AT 0855 ON [phone] BY S. YARBROUGH    Proteus species NOT DETECTED NOT DETECTED Final   Serratia marcescens NOT DETECTED NOT DETECTED Final   Carbapenem resistance DETECTED (A) NOT DETECTED Final    Comment: CRITICAL RESULT CALLED TO, READ BACK BY AND VERIFIED WITH: C. Delsa Sale D AT 0855 ON [phone] BY S. YARBROUGH AND NOTIFIED NATALIE DAVIS IN INFECTION CONTROL AT 0900 ON [phone] BY S. YARBROUGH    Haemophilus influenzae NOT DETECTED NOT DETECTED Final   Neisseria meningitidis NOT DETECTED NOT DETECTED Final   Pseudomonas aeruginosa DETECTED (A) NOT DETECTED Final    Comment: CRITICAL RESULT CALLED TO, READ BACK BY AND VERIFIED WITH: CDelsa Sale D AT 0855 ON [phone] BY S. YARBROUGH    Candida albicans NOT DETECTED NOT DETECTED Final   Candida glabrata NOT DETECTED NOT DETECTED Final   Candida krusei NOT DETECTED NOT DETECTED Final   Candida parapsilosis NOT DETECTED NOT DETECTED Final   Candida tropicalis NOT DETECTED NOT DETECTED Final  Urine culture     Status: None   Collection Time:  12/04/15  7:48 PM  Result Value Ref Range Status   Specimen Description URINE, CATHETERIZED  Final   Special Requests NONE  Final   Culture NO GROWTH 1 DAY  Final   Report Status 12/06/2015 FINAL  Final  MRSA PCR Screening     Status: Abnormal   Collection Time: 12/05/15 12:04 AM  Result Value Ref Range Status   MRSA by PCR POSITIVE (A) NEGATIVE Final    Comment:        The GeneXpert MRSA Assay (FDA approved for NASAL specimens only), is one component of a comprehensive MRSA colonization surveillance program. It is not intended to diagnose MRSA infection nor to guide or monitor treatment for MRSA infections. RESULT CALLED TO, READ BACK BY AND VERIFIED WITH: Heywood Footman RN 10:40 12/05/15 ([email or web address])   Wound culture     Status: None (Preliminary result)   Collection Time: 12/05/15  7:30 AM  Result Value Ref Range Status   Specimen Description WOUND  Final   Special Requests SACRAL  Final   Gram Stain   Final    FEW WBC PRESENT, PREDOMINANTLY PMN RARE SQUAMOUS EPITHELIAL CELLS PRESENT ABUNDANT GRAM NEGATIVE RODS MODERATE GRAM POSITIVE COCCI IN PAIRS FEW GRAM POSITIVE RODS Performed at [facility]    Culture   Final    ABUNDANT PSEUDOMONAS AERUGINOSA Performed at [facility]    Report Status PENDING  Incomplete  C difficile quick scan w PCR reflex     Status: Abnormal   Collection Time: 12/05/15  6:01 PM  Result Value Ref Range Status   C Diff antigen POSITIVE (A) NEGATIVE Final   C Diff toxin POSITIVE (A) NEGATIVE Final   C Diff interpretation Positive for toxigenic C. difficile  Final    Comment: CRITICAL RESULT CALLED TO, READ BACK BY AND VERIFIED WITHKathlen Mody RN 1929 12/05/15 A BROWNING   Culture, respiratory (tracheal aspirate)     Status:  None   Collection Time: 12/05/15  8:53 PM  Result Value Ref Range Status   Specimen Description TRACHEAL ASPIRATE  Final   Special Requests NONE  Final   Gram Stain   Final    ABUNDANT WBC PRESENT, PREDOMINANTLY  PMN FEW SQUAMOUS EPITHELIAL CELLS PRESENT ABUNDANT GRAM NEGATIVE RODS Performed at [facility]    Culture   Final    MODERATE PSEUDOMONAS AERUGINOSA Performed at [facility]    Report Status 12/08/2015 FINAL  Final   Organism ID, Bacteria PSEUDOMONAS AERUGINOSA  Final      Susceptibility   Pseudomonas aeruginosa - MIC*    CEFEPIME 16 INTERMEDIATE Intermediate     CEFTAZIDIME 32 RESISTANT Resistant     CIPROFLOXACIN 1 SENSITIVE Sensitive     GENTAMICIN <=1 SENSITIVE Sensitive     IMIPENEM >=16 RESISTANT Resistant     TOBRAMYCIN <=1 SENSITIVE Sensitive     * MODERATE PSEUDOMONAS AERUGINOSA    Studies/Results: Dg Chest Port 1 View  12/07/2015  CLINICAL DATA:  Patient with history of pneumonia, COPD and diabetes. EXAM: PORTABLE CHEST 1 VIEW COMPARISON:  Chest radiograph 12/06/2015. FINDINGS: Tracheostomy tube terminates in the mid trachea. The cup appears to be hyperinflated. Left IJ central venous catheter tip projects over the superior vena cava. Multiple monitoring leads overlie the patient. Stable cardiac and mediastinal contours. Interval decrease in size of now small left pleural effusion. Persistent heterogeneous opacities left lung base and left apex. No pneumothorax. IMPRESSION: Persistent overinflation of the tracheostomy cuff. Interval decrease in size of left pleural effusion with persistent left lower and upper lung airspace opacities. Electronically Signed   By: Lovey Newcomer M.D.   On: 12/07/2015 08:07     Assessment/Plan: Polymicrobial BCx- pseudomonas, klebsiella (KPC), enterococcus (non-VRE).  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 Will see if they can do testing on her isolate.   Cont flagyl IV til we can get po access for po vanco.   No change in her PEG while EOL  discussions.   Total days of antibiotics: avycaz, flagyl, vanco         Bobby Rumpf Infectious Diseases (pager) [phone] [email or web address] 12/08/2015, 10:07 AM  LOS: 4 days

## 2015-12-08 NOTE — Progress Notes (Addendum)
[place] TEAM 1 - Stepdown/ICU TEAM  Kristen Arnold  PJA:[id number] DOB: [birth year] DOA: 12/04/2015 PCP: Verneita Griffes, MD    Brief Narrative:  70 y.o. 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 She had a hospitalization to [facility] ICU 09/26/15 through 10/01/15 for aspiration PNA. During that admission she failed a swallow eval so her chronic G tube was exchanged for J tube by IR. Blood cultures that admission were noteable for coag neg staph and pseudomonas aeruginosa, sputum cultures were also noteable for pseudomonas (sputum resistant to both imipenem and zosyn; sputum and blood were both only sensitive to cipro). Due to her hx of C.diff, she was started on oral vanc per recs from ID.  Following her course of abx, she was discharged back to [facility].  On 05/04, she was brought to [facility] ED due to reports of AMS w/ minimal responsiveness x 24 hours. In the ED she was found to have septic shock presumed due to LLL HCAP as well as anemia with Hgb 6.1. She was started on levophed and 2u PRBC were ordered.  Significant Events: 5/4 Admitted with septic shock presumed due to HCAP as well as anemia (Hgb 6.1) 5/6 L lung collapse, peep, dnr 5/7 some improved aeration left  Assessment & Plan:  Chronic tracheostomy / vent dependence Care per PCCM   LLL HCAP w/ septic shock  Required levophed earlier in hospital stay - Hx pseudomonal and coag neg staph bacteremia Feb  Polymicrobial bacteremia with Enterococcus, CRE klebsiella, pseudomonas  abx per ID suggestions   LLL collapse  Care per PCCM - ongoing chest PT - increased PEEP   C diff colitis On Vanc per ID    COPD No evidence of acute exacerbation at this time   Anemia - acute on chronic Hgb presently stable   Recent Labs Lab 12/04/15 1928 12/05/15 0400 12/06/15 0533  HGB 6.1* 7.3* 8.7*   Hx DVT  Sacral decubitus ulcer stage IV Care as  per WOC Team  Altered mental status  likely due to sepsis and severe uremia   Hx anxiety / depression + chronic pain  AKI - in setting of shock crt appears to be slowly improving  Recent Labs Lab 12/04/15 1928 12/05/15 0400 12/06/15 0533  CREATININE 1.51* 1.66* 1.38*    Nutrition   DM CBG stable   MRSA screen +   DVT prophylaxis: SQ heparin  Code Status: NO CODE BLUE - DNR  Family Communication: no family present at time of exam  Disposition Plan: anticipate transition to full comfort w/ terminal wean    Consultants:  ID PCCM Palliative Care   Procedures:  CVL L IJ 5/04 >  Antimicrobials:  Vanc 05/04 > Zosyn 05/04 > Cipro 05/04 >  Subjective: Pt is non-communicative.  She opens her eyes, and appears to follow the examiner, but is unable to provide a hx, or follow simple commands.    Objective: Blood pressure 101/57, pulse 76, temperature 97.6 F (36.4 C), temperature source Oral, resp. rate 19, height 5' (1.524 m), weight 50.8 kg (111 lb 15.9 oz), SpO2 94 %.  Intake/Output Summary (Last 24 hours) at 12/08/15 0845 Last data filed at 12/08/15 0800  Gross per 24 hour  Intake 2709.03 ml  Output   1150 ml  Net 1559.03 ml   Filed Weights   12/06/15 0500 12/07/15 0500 12/08/15 0500  Weight: 49.7 kg (109 lb 9.1 oz) 47.8 kg (105  lb 6.1 oz) 50.8 kg (111 lb 15.9 oz)    Examination: General: No acute respiratory distress evident on vent  Lungs: poor air movement B bases - no wheezing  Cardiovascular: Regular rate and rhythm without murmur gallop or rub  Abdomen: Nontender, nondistended, soft, bowel sounds positive, no rebound, no ascites, no appreciable mass - bilious leaking from apparent prior PEG tube insertion site  Extremities: No significant cyanosis, clubbing, or edema bilateral lower extremities   CBC:  Recent Labs Lab 12/04/15 1928 12/05/15 0400 12/06/15 0533  WBC 39.4* 59.1* 35.1*  NEUTROABS 36.3*  --   --   HGB 6.1* 7.3* 8.7*  HCT  20.8* 23.6* 26.3*  MCV 87.4 89.7 86.8  PLT 729* 653* 712*   Basic Metabolic Panel:  Recent Labs Lab 12/04/15 1928 12/05/15 0400 12/06/15 0533  NA 141 139 141  K 5.1 5.3* 4.1  CL 89* 90* 95*  CO2 38* 34* 33*  GLUCOSE 207* 254* 184*  BUN 169* 164* 141*  CREATININE 1.51* 1.66* 1.38*  CALCIUM 9.1 8.1* 7.5*  MG  --  2.3  --   PHOS  --  4.6  --    GFR: Estimated Creatinine Clearance: 27.2 mL/min (by C-G formula based on Cr of 1.38).   Liver Function Tests:  Recent Labs Lab 12/04/15 1928 12/06/15 0533  AST 26 21  ALT 14 15  ALKPHOS 240* 100  BILITOT 0.5 0.5  PROT 6.3* 5.5*  ALBUMIN 1.2* 1.1*   Cardiac Enzymes:  Recent Labs Lab 12/05/15 0400 12/05/15 1110  TROPONINI 0.27* 0.18*   CBG:  Recent Labs Lab 12/07/15 1148 12/07/15 1554 12/07/15 1933 12/08/15 0413 12/08/15 0759  GLUCAP 115* 108* 96 94 85   Urine analysis:    Component Value Date/Time   COLORURINE YELLOW 12/04/2015 1948   APPEARANCEUR CLOUDY* 12/04/2015 1948   LABSPEC 1.017 12/04/2015 1948   PHURINE 5.5 12/04/2015 1948   GLUCOSEU NEGATIVE 12/04/2015 1948   HGBUR LARGE* 12/04/2015 1948   [place] NEGATIVE 12/04/2015 1948   [place] NEGATIVE 12/04/2015 1948   PROTEINUR 30* 12/04/2015 1948   NITRITE NEGATIVE 12/04/2015 1948   LEUKOCYTESUR MODERATE* 12/04/2015 1948    Recent Results (from the past 240 hour(s))  Blood Culture (routine x 2)     Status: Abnormal (Preliminary result)   Collection Time: 12/04/15  7:28 PM  Result Value Ref Range Status   Specimen Description BLOOD PICC LINE  Final   Special Requests BOTTLES DRAWN AEROBIC AND ANAEROBIC 5CC  Final   Culture  Setup Time   Final    Organism ID to follow [place] IN BOTH AEROBIC AND ANAEROBIC BOTTLES CRITICAL RESULT CALLED TO, READ BACK BY AND VERIFIED WITH: C. STEWART, PHARM D AT [place] ON [phone] BY S. YARBROUGH    Culture (A)  Final    KLEBSIELLA PNEUMONIAE SUSCEPTIBILITIES TO  FOLLOW PSEUDOMONAS AERUGINOSA    Report Status PENDING  Incomplete  Blood Culture (routine x 2)     Status: Abnormal (Preliminary result)   Collection Time: 12/04/15  7:28 PM  Result Value Ref Range Status   Specimen Description BLOOD PICC LINE  Final   Special Requests BOTTLES DRAWN AEROBIC AND ANAEROBIC 5CC  Final   Culture  Setup Time   Final    GRAM POSITIVE COCCI GRAM NEGATIVE RODS IN BOTH AEROBIC AND ANAEROBIC BOTTLES CRITICAL RESULT CALLED TO, READ BACK BY AND VERIFIED WITH: CNicole Kindred, PHARM D AT [place] ON [phone] BY Rhea Bleacher    Culture  KLEBSIELLA PNEUMONIAE PSEUDOMONAS AERUGINOSA  (A)  Final   Report Status PENDING  Incomplete  Blood Culture ID Panel (Reflexed)     Status: Abnormal   Collection Time: 12/04/15  7:28 PM  Result Value Ref Range Status   Enterococcus species DETECTED (A) NOT DETECTED Final    Comment: CRITICAL RESULT CALLED TO, READ BACK BY AND VERIFIED WITH: C. STEWART, PHARM D AT 0855 ON [id number] BY S. YARBROUGH    Vancomycin resistance NOT DETECTED NOT DETECTED Final   Listeria monocytogenes NOT DETECTED NOT DETECTED Final   Staphylococcus species NOT DETECTED NOT DETECTED Final   Staphylococcus aureus NOT DETECTED NOT DETECTED Final   Methicillin resistance NOT DETECTED NOT DETECTED Final   Streptococcus species NOT DETECTED NOT DETECTED Final   Streptococcus agalactiae NOT DETECTED NOT DETECTED Final   Streptococcus pneumoniae NOT DETECTED NOT DETECTED Final   Streptococcus pyogenes NOT DETECTED NOT DETECTED Final   Acinetobacter baumannii NOT DETECTED NOT DETECTED Final   Enterobacteriaceae species NOT DETECTED NOT DETECTED Final   Enterobacter cloacae complex NOT DETECTED NOT DETECTED Final   Escherichia coli NOT DETECTED NOT DETECTED Final   Klebsiella oxytoca NOT DETECTED NOT DETECTED Final   Klebsiella pneumoniae DETECTED (A) NOT DETECTED Final    Comment: CRITICAL RESULT CALLED TO, READ BACK BY AND VERIFIED WITH: C. STEWART,PHARM D AT 0855 ON  [id number] BY S. YARBROUGH    Proteus species NOT DETECTED NOT DETECTED Final   Serratia marcescens NOT DETECTED NOT DETECTED Final   Carbapenem resistance DETECTED (A) NOT DETECTED Final    Comment: CRITICAL RESULT CALLED TO, READ BACK BY AND VERIFIED WITH: C. STEWART,PHARM D AT 0855 ON [phone] BY S. YARBROUGH AND NOTIFIED NATALIE DAVIS IN INFECTION CONTROL AT 0900 ON [phone] BY S. YARBROUGH    Haemophilus influenzae NOT DETECTED NOT DETECTED Final   Neisseria meningitidis NOT DETECTED NOT DETECTED Final   Pseudomonas aeruginosa DETECTED (A) NOT DETECTED Final    Comment: CRITICAL RESULT CALLED TO, READ BACK BY AND VERIFIED WITH: CLamont Snowball D AT 0855 ON [phone] BY S. YARBROUGH    Candida albicans NOT DETECTED NOT DETECTED Final   Candida glabrata NOT DETECTED NOT DETECTED Final   Candida krusei NOT DETECTED NOT DETECTED Final   Candida parapsilosis NOT DETECTED NOT DETECTED Final   Candida tropicalis NOT DETECTED NOT DETECTED Final  Urine culture     Status: None   Collection Time: 12/04/15  7:48 PM  Result Value Ref Range Status   Specimen Description URINE, CATHETERIZED  Final   Special Requests NONE  Final   Culture NO GROWTH 1 DAY  Final   Report Status 12/06/2015 FINAL  Final  MRSA PCR Screening     Status: Abnormal   Collection Time: 12/05/15 12:04 AM  Result Value Ref Range Status   MRSA by PCR POSITIVE (A) NEGATIVE Final    Comment:        The GeneXpert MRSA Assay (FDA approved for NASAL specimens only), is one component of a comprehensive MRSA colonization surveillance program. It is not intended to diagnose MRSA infection nor to guide or monitor treatment for MRSA infections. RESULT CALLED TO, READ BACK BY AND VERIFIED WITH: Filbert Berthold RN 10:40 12/05/15 ([email or web address])   Wound culture     Status: None (Preliminary result)   Collection Time: 12/05/15  7:30 AM  Result Value Ref Range Status   Specimen Description WOUND  Final   Special Requests SACRAL  Final   Gram  Stain   Final  FEW WBC PRESENT, PREDOMINANTLY PMN RARE SQUAMOUS EPITHELIAL CELLS PRESENT ABUNDANT GRAM NEGATIVE RODS MODERATE GRAM POSITIVE COCCI IN PAIRS FEW GRAM POSITIVE RODS Performed at [facility]    Culture   Final    ABUNDANT PSEUDOMONAS AERUGINOSA Performed at [facility]    Report Status PENDING  Incomplete  C difficile quick scan w PCR reflex     Status: Abnormal   Collection Time: 12/05/15  6:01 PM  Result Value Ref Range Status   C Diff antigen POSITIVE (A) NEGATIVE Final   C Diff toxin POSITIVE (A) NEGATIVE Final   C Diff interpretation Positive for toxigenic C. difficile  Final    Comment: CRITICAL RESULT CALLED TO, READ BACK BY AND VERIFIED WITH: Bryson Dames RN 1929 12/05/15 A BROWNING   Culture, respiratory (tracheal aspirate)     Status: None   Collection Time: 12/05/15  8:53 PM  Result Value Ref Range Status   Specimen Description TRACHEAL ASPIRATE  Final   Special Requests NONE  Final   Gram Stain   Final    ABUNDANT WBC PRESENT, PREDOMINANTLY PMN FEW SQUAMOUS EPITHELIAL CELLS PRESENT ABUNDANT GRAM NEGATIVE RODS Performed at [facility]    Culture   Final    MODERATE PSEUDOMONAS AERUGINOSA Performed at [facility]    Report Status 12/08/2015 FINAL  Final   Organism ID, Bacteria PSEUDOMONAS AERUGINOSA  Final      Susceptibility   Pseudomonas aeruginosa - MIC*    CEFEPIME 16 INTERMEDIATE Intermediate     CEFTAZIDIME 32 RESISTANT Resistant     CIPROFLOXACIN 1 SENSITIVE Sensitive     GENTAMICIN <=1 SENSITIVE Sensitive     IMIPENEM >=16 RESISTANT Resistant     TOBRAMYCIN <=1 SENSITIVE Sensitive     * MODERATE PSEUDOMONAS AERUGINOSA      Scheduled Meds: . sodium chloride   Intravenous Once  . antiseptic oral rinse  7 mL Mouth Rinse QID  . ceftazidime avibactam (AVYCAZ) IVPB  0.94 g Intravenous Q12H  . chlorhexidine gluconate (SAGE KIT)  15 mL Mouth Rinse BID  . Chlorhexidine Gluconate Cloth  6 each Topical Q0600   . collagenase   Topical Daily  . heparin  5,000 Units Subcutaneous Q8H  . insulin aspart  0-9 Units Subcutaneous Q4H  . memantine  5 mg Per Tube BID  . metronidazole  500 mg Intravenous Q8H  . mupirocin ointment  1 application Nasal BID  . pantoprazole (PROTONIX) IV  40 mg Intravenous QHS  . sodium chloride flush  10-40 mL Intracatheter Q12H  . vancomycin  500 mg Oral Q6H  . vancomycin  500 mg Intravenous Q24H   Continuous Infusions: . sodium chloride 75 mL/hr at 12/08/15 0726  . feeding supplement (VITAL 1.5 CAL) Stopped (12/05/15 1450)  . norepinephrine (LEVOPHED) Adult infusion Stopped (12/07/15 1051)     LOS: 4 days    Time spent: 35 minutes   Cherene Altes, MD [facility] Office  [phone] Pager - Text Page per Amion as per below:  On-Call/Text Page:      [email or web address]      password [email or web address]  If 7PM-7AM, please contact night-coverage [email or web address] Password [email or web address] 12/08/2015, 8:45 AM

## 2015-12-09 DIAGNOSIS — A0472 Enterocolitis due to Clostridium difficile, not specified as recurrent: Secondary | ICD-10-CM | POA: Diagnosis present

## 2015-12-09 DIAGNOSIS — B965 Pseudomonas (aeruginosa) (mallei) (pseudomallei) as the cause of diseases classified elsewhere: Secondary | ICD-10-CM | POA: Diagnosis present

## 2015-12-09 DIAGNOSIS — B952 Enterococcus as the cause of diseases classified elsewhere: Secondary | ICD-10-CM

## 2015-12-09 DIAGNOSIS — R7881 Bacteremia: Secondary | ICD-10-CM | POA: Diagnosis present

## 2015-12-09 LAB — GLUCOSE, CAPILLARY
GLUCOSE-CAPILLARY: 87 mg/dL (ref 65–99)
GLUCOSE-CAPILLARY: 87 mg/dL (ref 65–99)
GLUCOSE-CAPILLARY: 90 mg/dL (ref 65–99)
GLUCOSE-CAPILLARY: 103 mg/dL — AB (ref 65–99)
GLUCOSE-CAPILLARY: 108 mg/dL — AB (ref 65–99)
Glucose-Capillary: 90 mg/dL (ref 65–99)

## 2015-12-09 LAB — BASIC METABOLIC PANEL
Anion gap: 13 (ref 5–15)
BUN: 63 mg/dL — ABNORMAL HIGH (ref 6–20)
CHLORIDE: 115 mmol/L — AB (ref 101–111)
CO2: 22 mmol/L (ref 22–32)
Calcium: 7.6 mg/dL — ABNORMAL LOW (ref 8.9–10.3)
Creatinine, Ser: 1.04 mg/dL — ABNORMAL HIGH (ref 0.44–1.00)
GFR calc non Af Amer: 53 mL/min — ABNORMAL LOW (ref >60)
Glucose, Bld: 123 mg/dL — ABNORMAL HIGH (ref 65–99)
POTASSIUM: 3.6 mmol/L (ref 3.5–5.1)
SODIUM: 150 mmol/L — AB (ref 135–145)

## 2015-12-09 LAB — GENTAMICIN LEVEL, RANDOM: GENTAMICIN RM: 11.6 ug/mL

## 2015-12-09 LAB — VANCOMYCIN, TROUGH: Vancomycin Tr: 20 ug/mL (ref 10.0–20.0)

## 2015-12-09 MED ORDER — VANCOMYCIN HCL 500 MG IV SOLR
500.0000 mg | INTRAVENOUS | Status: DC
  Administered 2015-12-09 – 2015-12-11 (×3): 500 mg via INTRAVENOUS
  Filled 2015-12-09 (×4): qty 500

## 2015-12-09 NOTE — Progress Notes (Signed)
Pharmacy Antibiotic Note  Kristen Arnold is a 70 y.o. female admitted on 12/04/2015 with bacteremia.  Pharmacy has been consulted for gentamicin dosing. Rec'd gent 7mg /kg but ~11-hour level was well above expected at 11.6; pt is not a candidate for extended-interval dosing.  Plan: Random gent level w/ am labs tomorrow to assess when to begin standard dosing.  Height: 5' (152.4 cm) Weight: 110 lb 10.7 oz (50.2 kg) IBW/kg (Calculated) : 45.5  Temp (24hrs), Avg:98 F (36.7 C), Min:97.4 F (36.3 C), Max:98.5 F (36.9 C)   Recent Labs Lab 12/04/15 1928 12/04/15 2003 12/04/15 2230 12/05/15 0400 12/05/15 1110 12/06/15 0533 12/09/15 0255  WBC 39.4*  --   --  59.1*  --  35.1*  --   CREATININE 1.51*  --   --  1.66*  --  1.38*  --   LATICACIDVEN  --  2.53* 3.4*  --  2.1*  --   --   GENTRANDOM  --   --   --   --   --   --  11.6    Estimated Creatinine Clearance: 27.2 mL/min (by C-G formula based on Cr of 1.38).    Allergies  Allergen Reactions  . Codeine     Unknown reaction, listed on MAR    Antimicrobials this admission: Zosyn 5/4 >> 5/5 Cipro 5/5 >> 5/5 Vancomycin 5/4 >> Avycaz 5/5 >> Gentamicin 5/8 >>  Dose adjustments this admission: 5/9 11-hr gent level 11.6  --> hold off on next dose  Microbiology results: 5/4 BCx: - CRE Kleb pneumo (pan R) - Pseudomonas (R to cefepime, ceftazidime, imipenem, I to cipro, S to gent) - Enterococcus (Sensitivities pending) 5/5 TA - Pseudomonas (S to cipro and gentamicin)  Thank you for allowing pharmacy to be a part of this patient's care.  Vernard GamblesVeronda Shar Paez, PharmD, BCPS  12/09/2015 5:22 AM

## 2015-12-09 NOTE — Progress Notes (Signed)
[facility] TEAM 1 - Stepdown/ICU TEAM Progress Note  Iysis Germain EPP:[id number] DOB: 1946-05-07 DOA: 12/04/2015 PCP: Verneita Griffes, MD  Admit HPI / Brief Narrative: 70 y.o. female WF PMHx Tracheostomy after failure to wean from vent following right hemicolectomy for bowel obstruction in December 2016. She had a hospitalization to [facility] ICU 09/26/15 through 10/01/15 for aspiration PNA. During that admission she failed a swallow eval so her chronic G tube was exchanged for J tube by IR. Blood cultures that admission were noteable for coag neg staph and pseudomonas aeruginosa, sputum cultures were also noteable for pseudomonas (sputum resistant to both imipenem and zosyn; sputum and blood were both only sensitive to cipro). Due to her hx of C.diff, she was started on oral vanc per recs from ID. Following her course of abx, she was discharged back to [facility].  On 05/04, she was brought to [facility] ED due to reports of AMS w/ minimal responsiveness x 24 hours. In the ED she was found to have septic shock presumed due to LLL HCAP as well as anemia with Hgb 6.1. She was started on levophed and 2u PRBC were ordered.  HPI/Subjective: 5/9 patient will open eyes with sternal rub, when asked about pain would point to her stomach. On vent unable to communicate.  Assessment/Plan: Chronic tracheostomy / vent dependence -Care per PCCM  -Spoke with Dr. Rhea Pink Palliative Care after completing conversation initially agreed that sons wanted patient to be on terminal wean/comfort care. Relayed this information to [place] PCCM who examined patient and also found patient alert, and therefore was hasn't been to perform terminal wean. Was going to speak with Dr. Hilma Favors and then we would determine plan of care. Unfortunately was unable to touch base with either Dr. later in the day. Would maintain patient as DO NOT RESUSCITATE, and attempt to contact Dr. Hilma Favors and Dr. Elsworth Soho in the a.m. to determine final  goals of care.  LLL HCAP w/ septic shock  -Required levophed earlier in hospital stay -Off pressors - Hx pseudomonal and coag neg staph bacteremia Feb  Polymicrobial bacteremia with Enterococcus, CRE klebsiella, pseudomonas  abx per ID suggestions   LLL collapse  Care per PCCM - ongoing chest PT - increased PEEP   C diff colitis -On Vanc per ID   COPD No evidence of acute exacerbation at this time   Anemia - acute on chronic Hgb presently stable   Hx DVT  Sacral decubitus ulcer stage IV Care as per [facility] Team  Altered mental status  likely due to sepsis and severe uremia   Hx anxiety / depression + chronic pain  AKI - in setting of shock crt appears to be slowly improving  Nutrition  DM Type 2  CBG stable   Goals of care -Per Palliative Care note 5/8 Son Corene Cornea endorses full comfort care. I explained that the medical team felt it would be appropriate to allow for a natural death to occur by discontinuing mechanical ventilation and keeping her comfortable with sedation and pain medication. He agrees that this is what he would want and the wants her suffering to end -See chronic tracheostomy  Code Status: FULL Family Communication: no family present at time of exam Disposition Plan: Comfort care???    Consultants: ID PCCM Palliative Care   Procedure/Significant Events: 5/4 Admitted with septic shock presumed due to HCAP as well as anemia (Hgb 6.1) 5/6 Lt lung collapse, peep, dnr 5/7 some improved aeration left   Culture   Antibiotics:  Vanc 05/04 > Zosyn 05/04 > Cipro 05/04 >   DVT prophylaxis: Subcutaneous heparin   Devices    LINES / TUBES:  CVL L IJ 5/04 >    Continuous Infusions: . sodium chloride 75 mL/hr at 12/09/15 1956    Objective: VITAL SIGNS: Temp: 97.7 F (36.5 C) (05/09 2000) Temp Source: Oral (05/09 2000) BP: 129/67 mmHg (05/09 2000) Pulse Rate: 92 (05/09 2035) SPO2; FIO2:   Intake/Output Summary (Last 24  hours) at 12/09/15 2047 Last data filed at 12/09/15 2000  Gross per 24 hour  Intake   2135 ml  Output    975 ml  Net   1160 ml     Exam: General: Patient opens eyes to sternal rub, positive Acute respiratory distress (on vent) Eyes: negative scleral hemorrhage, negative icterus ENT: Negative Runny nose, negative gingival bleeding, Neck:  Negative scars, masses, torticollis, lymphadenopathy, JVD, trachea tracheostomy in place, negative bleeding or signs of infection. Lungs: Clear to auscultation bilaterally without wheezes or crackles Cardiovascular: Regular rate and rhythm without murmur gallop or rub normal S1 and S2 Abdomen: Ostomy in place draining brownish fluid, negative abdominal pain to palpation (although patient points to abdomen when asked about pain) , nondistended, negative bowel sounds, no rebound, no ascites, no appreciable mass Extremities: No significant cyanosis, clubbing, or edema bilateral lower extremities Psychiatric:  Unable to assess secondary to tracheostomy  Neurologic:  Follows commands, moves all extremities to command.   Data Reviewed: Basic Metabolic Panel:  Recent Labs Lab 12/04/15 1928 12/05/15 0400 12/06/15 0533  NA 141 139 141  K 5.1 5.3* 4.1  CL 89* 90* 95*  CO2 38* 34* 33*  GLUCOSE 207* 254* 184*  BUN 169* 164* 141*  CREATININE 1.51* 1.66* 1.38*  CALCIUM 9.1 8.1* 7.5*  MG  --  2.3  --   PHOS  --  4.6  --    Liver Function Tests:  Recent Labs Lab 12/04/15 1928 12/06/15 0533  AST 26 21  ALT 14 15  ALKPHOS 240* 100  BILITOT 0.5 0.5  PROT 6.3* 5.5*  ALBUMIN 1.2* 1.1*   No results for input(s): LIPASE, AMYLASE in the last 168 hours. No results for input(s): AMMONIA in the last 168 hours. CBC:  Recent Labs Lab 12/04/15 1928 12/05/15 0400 12/06/15 0533  WBC 39.4* 59.1* 35.1*  NEUTROABS 36.3*  --   --   HGB 6.1* 7.3* 8.7*  HCT 20.8* 23.6* 26.3*  MCV 87.4 89.7 86.8  PLT 729* 653* 596*   Cardiac Enzymes:  Recent  Labs Lab 12/05/15 0400 12/05/15 1110  TROPONINI 0.27* 0.18*   BNP (last 3 results) No results for input(s): BNP in the last 8760 hours.  ProBNP (last 3 results) No results for input(s): PROBNP in the last 8760 hours.  CBG:  Recent Labs Lab 12/09/15 0029 12/09/15 0422 12/09/15 0817 12/09/15 1630 12/09/15 1959  GLUCAP 90 87 87 103* 108*    Recent Results (from the past 240 hour(s))  Blood Culture (routine x 2)     Status: Abnormal (Preliminary result)   Collection Time: 12/04/15  7:28 PM  Result Value Ref Range Status   Specimen Description BLOOD PICC LINE  Final   Special Requests BOTTLES DRAWN AEROBIC AND ANAEROBIC 5CC  Final   Culture  Setup Time   Final    Organism ID to follow GRAM POSITIVE COCCI GRAM NEGATIVE RODS IN BOTH AEROBIC AND ANAEROBIC BOTTLES CRITICAL RESULT CALLED TO, READ BACK BY AND VERIFIED WITH: C. STEWART, PHARM D AT  0855 ON [id number] BY Rhea Bleacher    Culture (A)  Final    KLEBSIELLA PNEUMONIAE CARBAPENEMASE RESISTANT ENTEROBACTERIACAE PSEUDOMONAS AERUGINOSA ENTEROCOCCUS SPECIES RESULT CALLED TO, READ BACK BY AND VERIFIED WITH: DR HATCHER 12/08/15 @ 28 M VESTAL NOTIFIED M DAVIS W/ INFECTION PREVENTION 12/08/15 @ 1100 M VESTAL SUSCEPTIBILITIES TO FOLLOW    Report Status PENDING  Incomplete   Organism ID, Bacteria KLEBSIELLA PNEUMONIAE  Final   Organism ID, Bacteria PSEUDOMONAS AERUGINOSA  Final      Susceptibility   Klebsiella pneumoniae - MIC*    AMPICILLIN >=32 RESISTANT Resistant     CEFAZOLIN >=64 RESISTANT Resistant     CEFEPIME 16 RESISTANT Resistant     CEFTAZIDIME >=64 RESISTANT Resistant     CEFTRIAXONE >=64 RESISTANT Resistant     CIPROFLOXACIN >=4 RESISTANT Resistant     GENTAMICIN >=16 RESISTANT Resistant     IMIPENEM 8 RESISTANT Resistant     TRIMETH/SULFA >=320 RESISTANT Resistant     AMPICILLIN/SULBACTAM >=32 RESISTANT Resistant     PIP/TAZO >=128 RESISTANT Resistant     * KLEBSIELLA PNEUMONIAE   Pseudomonas aeruginosa - MIC*     CEFTAZIDIME >=64 RESISTANT Resistant     CIPROFLOXACIN 2 INTERMEDIATE Intermediate     GENTAMICIN <=1 SENSITIVE Sensitive     IMIPENEM >=16 RESISTANT Resistant     CEFEPIME 32 RESISTANT Resistant     * PSEUDOMONAS AERUGINOSA  Blood Culture (routine x 2)     Status: Abnormal (Preliminary result)   Collection Time: 12/04/15  7:28 PM  Result Value Ref Range Status   Specimen Description BLOOD PICC LINE  Final   Special Requests BOTTLES DRAWN AEROBIC AND ANAEROBIC 5CC  Final   Culture  Setup Time   Final    GRAM POSITIVE COCCI GRAM NEGATIVE RODS IN BOTH AEROBIC AND ANAEROBIC BOTTLES CRITICAL RESULT CALLED TO, READ BACK BY AND VERIFIED WITH: C. STEWART, PHARM D AT [place] ON [id number] BY S. YARBROUGH    Culture (A)  Final    KLEBSIELLA PNEUMONIAE PSEUDOMONAS AERUGINOSA ENTEROCOCCUS SPECIES SUSCEPTIBILITIES TO FOLLOW    Report Status PENDING  Incomplete  Blood Culture ID Panel (Reflexed)     Status: Abnormal   Collection Time: 12/04/15  7:28 PM  Result Value Ref Range Status   Enterococcus species DETECTED (A) NOT DETECTED Final    Comment: CRITICAL RESULT CALLED TO, READ BACK BY AND VERIFIED WITH: C. STEWART, PHARM D AT 0855 ON [id number] BY S. YARBROUGH    Vancomycin resistance NOT DETECTED NOT DETECTED Final   Listeria monocytogenes NOT DETECTED NOT DETECTED Final   Staphylococcus species NOT DETECTED NOT DETECTED Final   Staphylococcus aureus NOT DETECTED NOT DETECTED Final   Methicillin resistance NOT DETECTED NOT DETECTED Final   Streptococcus species NOT DETECTED NOT DETECTED Final   Streptococcus agalactiae NOT DETECTED NOT DETECTED Final   Streptococcus pneumoniae NOT DETECTED NOT DETECTED Final   Streptococcus pyogenes NOT DETECTED NOT DETECTED Final   Acinetobacter baumannii NOT DETECTED NOT DETECTED Final   Enterobacteriaceae species NOT DETECTED NOT DETECTED Final   Enterobacter cloacae complex NOT DETECTED NOT DETECTED Final   Escherichia coli NOT DETECTED NOT DETECTED  Final   Klebsiella oxytoca NOT DETECTED NOT DETECTED Final   Klebsiella pneumoniae DETECTED (A) NOT DETECTED Final    Comment: CRITICAL RESULT CALLED TO, READ BACK BY AND VERIFIED WITH: C. STEWART,PHARM D AT 0855 ON [id number] BY S. YARBROUGH    Proteus species NOT DETECTED NOT DETECTED Final   Serratia marcescens NOT DETECTED NOT  DETECTED Final   Carbapenem resistance DETECTED (A) NOT DETECTED Final    Comment: CRITICAL RESULT CALLED TO, READ BACK BY AND VERIFIED WITH: C. STEWART,PHARM D AT 0855 ON [id number] BY S. YARBROUGH AND NOTIFIED NATALIE DAVIS IN INFECTION CONTROL AT 0900 ON [id number] BY S. YARBROUGH    Haemophilus influenzae NOT DETECTED NOT DETECTED Final   Neisseria meningitidis NOT DETECTED NOT DETECTED Final   Pseudomonas aeruginosa DETECTED (A) NOT DETECTED Final    Comment: CRITICAL RESULT CALLED TO, READ BACK BY AND VERIFIED WITH: CLamont Snowball D AT 0855 ON [id number] BY S. YARBROUGH    Candida albicans NOT DETECTED NOT DETECTED Final   Candida glabrata NOT DETECTED NOT DETECTED Final   Candida krusei NOT DETECTED NOT DETECTED Final   Candida parapsilosis NOT DETECTED NOT DETECTED Final   Candida tropicalis NOT DETECTED NOT DETECTED Final  Urine culture     Status: None   Collection Time: 12/04/15  7:48 PM  Result Value Ref Range Status   Specimen Description URINE, CATHETERIZED  Final   Special Requests NONE  Final   Culture NO GROWTH 1 DAY  Final   Report Status 12/06/2015 FINAL  Final  MRSA PCR Screening     Status: Abnormal   Collection Time: 12/05/15 12:04 AM  Result Value Ref Range Status   MRSA by PCR POSITIVE (A) NEGATIVE Final    Comment:        The GeneXpert MRSA Assay (FDA approved for NASAL specimens only), is one component of a comprehensive MRSA colonization surveillance program. It is not intended to diagnose MRSA infection nor to guide or monitor treatment for MRSA infections. RESULT CALLED TO, READ BACK BY AND VERIFIED WITH: Filbert Berthold RN 10:40 12/05/15  ([email or web address])   Wound culture     Status: None (Preliminary result)   Collection Time: 12/05/15  7:30 AM  Result Value Ref Range Status   Specimen Description WOUND  Final   Special Requests SACRAL  Final   Gram Stain   Final    FEW WBC PRESENT, PREDOMINANTLY PMN RARE SQUAMOUS EPITHELIAL CELLS PRESENT ABUNDANT GRAM NEGATIVE RODS MODERATE GRAM POSITIVE COCCI IN PAIRS FEW GRAM POSITIVE RODS Performed at [facility]    Culture   Final    ABUNDANT PSEUDOMONAS AERUGINOSA Performed at [facility]    Report Status PENDING  Incomplete  C difficile quick scan w PCR reflex     Status: Abnormal   Collection Time: 12/05/15  6:01 PM  Result Value Ref Range Status   C Diff antigen POSITIVE (A) NEGATIVE Final   C Diff toxin POSITIVE (A) NEGATIVE Final   C Diff interpretation Positive for toxigenic C. difficile  Final    Comment: CRITICAL RESULT CALLED TO, READ BACK BY AND VERIFIED WITH: Bryson Dames RN 1929 12/05/15 A BROWNING   Culture, respiratory (tracheal aspirate)     Status: None   Collection Time: 12/05/15  8:53 PM  Result Value Ref Range Status   Specimen Description TRACHEAL ASPIRATE  Final   Special Requests NONE  Final   Gram Stain   Final    ABUNDANT WBC PRESENT, PREDOMINANTLY PMN FEW SQUAMOUS EPITHELIAL CELLS PRESENT ABUNDANT GRAM NEGATIVE RODS Performed at [facility]    Culture   Final    MODERATE PSEUDOMONAS AERUGINOSA Performed at [facility]    Report Status 12/08/2015 FINAL  Final   Organism ID, Bacteria PSEUDOMONAS AERUGINOSA  Final      Susceptibility   Pseudomonas aeruginosa - MIC*  CEFEPIME 16 INTERMEDIATE Intermediate     CEFTAZIDIME 32 RESISTANT Resistant     CIPROFLOXACIN 1 SENSITIVE Sensitive     GENTAMICIN <=1 SENSITIVE Sensitive     IMIPENEM >=16 RESISTANT Resistant     TOBRAMYCIN <=1 SENSITIVE Sensitive     * MODERATE PSEUDOMONAS AERUGINOSA  Culture, blood (Routine X 2) w Reflex to ID Panel     Status: None (Preliminary  result)   Collection Time: 12/09/15 12:00 PM  Result Value Ref Range Status   Specimen Description BLOOD RIGHT ARM  Final   Special Requests IN PEDIATRIC BOTTLE 3CC  Final   Culture PENDING  Incomplete   Report Status PENDING  Incomplete  Culture, blood (routine x 2)     Status: None (Preliminary result)   Collection Time: 12/09/15 12:20 PM  Result Value Ref Range Status   Specimen Description BLOOD RIGHT HAND  Final   Special Requests IN PEDIATRIC BOTTLE 3CC  Final   Culture PENDING  Incomplete   Report Status PENDING  Incomplete     Studies:  Recent x-ray studies have been reviewed in detail by the Attending Physician  Scheduled Meds:  Scheduled Meds: . antiseptic oral rinse  7 mL Mouth Rinse QID  . ceftazidime avibactam (AVYCAZ) IVPB  0.94 g Intravenous Q12H  . chlorhexidine gluconate (SAGE KIT)  15 mL Mouth Rinse BID  . Chlorhexidine Gluconate Cloth  6 each Topical Q0600  . collagenase   Topical Daily  . heparin  5,000 Units Subcutaneous Q8H  . insulin aspart  0-9 Units Subcutaneous Q4H  . metronidazole  500 mg Intravenous Q8H  . mupirocin ointment  1 application Nasal BID  . vancomycin  500 mg Oral Q6H  . vancomycin  500 mg Intravenous Q24H    Time spent on care of this patient: 40 mins   Tiwana Chavis, Geraldo Docker , MD  [facility] Office  [phone] Pager - [phone]  On-Call/Text Page:      [email or web address]      password [email or web address]  If 7PM-7AM, please contact night-coverage [email or web address] Password [email or web address] 12/09/2015, 8:47 PM   LOS: 5 days   Care during the described time interval was provided by me .  I have reviewed this patient's available data, including medical history, events of note, physical examination, and all test results as part of my evaluation. I have personally reviewed and interpreted all radiology studies.   Dia Crawford, MD [phone] Pager

## 2015-12-09 NOTE — Progress Notes (Signed)
RT note- patient transported to 2600 with transfer orders.

## 2015-12-09 NOTE — Progress Notes (Signed)
Pharmacy Antibiotic Note  Kristen Arnold is a 70 y.o. female admitted on 12/04/2015 with bacteremia.  Pharmacy has been consulted for vancomycin and gentamicin dosing.   Now day #6 of abx for KPC + PSA + Enterococcus bacteremia. Afebrile. VT tonight was therapeutic at 20. Since borderline high will push back vancomycin dose a few hours.  Plan: Continue vancomycin 500mg  IV Q24H Continue Avycaz 0.94gm IV Q12H Continue PO vanc 500 PO Q6H (none given yet b/c no access) + Flagyl 500mg  IV Q8H per MD  Height: 5' (152.4 cm) Weight: 109 lb 12.6 oz (49.8 kg) IBW/kg (Calculated) : 45.5  Temp (24hrs), Avg:97.7 F (36.5 C), Min:97.4 F (36.3 C), Max:98 F (36.7 C)   Recent Labs Lab 12/04/15 1928 12/04/15 2003 12/04/15 2230 12/05/15 0400 12/05/15 1110 12/06/15 0533 12/09/15 0255 12/09/15 2000  WBC 39.4*  --   --  59.1*  --  35.1*  --   --   CREATININE 1.51*  --   --  1.66*  --  1.38*  --  1.04*  LATICACIDVEN  --  2.53* 3.4*  --  2.1*  --   --   --   VANCOTROUGH  --   --   --   --   --   --   --  20  GENTRANDOM  --   --   --   --   --   --  11.6  --     Estimated Creatinine Clearance: 36.2 mL/min (by C-G formula based on Cr of 1.04).    Allergies  Allergen Reactions  . Codeine     Unknown reaction, listed on MAR    Antimicrobials this admission: Zosyn 5/4 >> 5/5 Cipro 5/5 >> 5/5 Vancomycin 5/4 >> Avycaz 5/5 >> Gentamicin 5/8 >>  Dose adjustments this admission: 5/9 11-hr gent level 11.6  --> hold off on next dose  Microbiology results: 5/4 BCx: - CRE Kleb pneumo (pan R) - Pseudomonas (R to cefepime, ceftazidime, imipenem, I to cipro, S to gent) - Enterococcus (Sensitivities pending) 5/5 TA - Pseudomonas (S to cipro and gentamicin)  Thank you for allowing pharmacy to be a part of this patient's care.  Enzo BiNathan Robbyn Hodkinson, PharmD, BCPS Clinical Pharmacist Pager [phone] 12/09/2015 9:05 PM

## 2015-12-09 NOTE — Progress Notes (Signed)
PULMONARY / CRITICAL CARE MEDICINE   Name: Kristen Arnold MRN: [id number] DOB: [birth year]    ADMISSION DATE:  12/04/2015 CONSULTATION DATE:  12/04/15  REFERRING MD:  EDP  CHIEF COMPLAINT:  AMS  70 year old woman transferred from [facility] due to being unresponsive for 24 hours. She was noted to be hypotensive with a hemoglobin of 6.1, chest x-ray showed a left lower lobe airspace disease. She had right hemicolectomy for small bowel obstruction in December 2016 and required prolonged mechanical ventilation and tracheostomy/ PEG placement. She was hospitalized in 09/2015 for aspiration pneumonia-blood cultures positive for coag-negative staph and Pseudomonas-related to pneumonia/ HCAP. she also has a history of recurrent Clostridium difficile diarrhea and severe protein calorie malnutrition    Subjective-  Off Levophed drip Does not appear to be in pain  VITAL SIGNS: BP 117/63 mmHg  Pulse 81  Temp(Src) 97.7 F (36.5 C) (Oral)  Resp 21  Ht 5' (1.524 m)  Wt 110 lb 10.7 oz (50.2 kg)  BMI 21.61 kg/m2  SpO2 98%  HEMODYNAMICS:    VENTILATOR SETTINGS: Vent Mode:  [-] CPAP;PSV FiO2 (%):  [30 %-50 %] 40 % Set Rate:  [16 bmp] 16 bmp Vt Set:  [480 mL] 480 mL PEEP:  [5 cmH20-8 cmH20] 5 cmH20 Pressure Support:  [10 cmH20] 10 cmH20 Plateau Pressure:  [16 cmH20-21 cmH20] 18 cmH20  INTAKE / OUTPUT: I/O last 3 completed shifts: In: 3482.5 [I.V.:2662.5; Other:70; IV Piggyback:750] Out: 2400 [Urine:1675; Other:725]   PHYSICAL EXAMINATION: General: Chronically ill appearing elderly female, cachectic, in NAD,alert. Neuro: opens eyes to touch,  Following commands, contracted uppers chronic HEENT: Kristen Arnold. PERRL, trach clean Cardiovascular: s1 s2 RRR  Lungs: coarse, left Abdomen: BS x 4, soft, NT/ND. PEG out, pouch to site with bilious drainage. Musculoskeletal: No gross deformities, no edema.  Skin: Intact, warm, no rashes.   LABS:  BMET  Recent Labs Lab 12/04/15 1928  12/05/15 0400 12/06/15 0533  NA 141 139 141  K 5.1 5.3* 4.1  CL 89* 90* 95*  CO2 38* 34* 33*  BUN 169* 164* 141*  CREATININE 1.51* 1.66* 1.38*  GLUCOSE 207* 254* 184*    Electrolytes  Recent Labs Lab 12/04/15 1928 12/05/15 0400 12/06/15 0533  CALCIUM 9.1 8.1* 7.5*  MG  --  2.3  --   PHOS  --  4.6  --     CBC  Recent Labs Lab 12/04/15 1928 12/05/15 0400 12/06/15 0533  WBC 39.4* 59.1* 35.1*  HGB 6.1* 7.3* 8.7*  HCT 20.8* 23.6* 26.3*  PLT 729* 653* 596*    Coag's No results for input(s): APTT, INR in the last 168 hours.  Sepsis Markers  Recent Labs Lab 12/04/15 2003 12/04/15 2230 12/05/15 1110  LATICACIDVEN 2.53* 3.4* 2.1*    ABG  Recent Labs Lab 12/04/15 1912 12/05/15 0325  PHART 7.356 7.341*  PCO2ART 82.7* 69.6*  PO2ART 72.0* 178*    Liver Enzymes  Recent Labs Lab 12/04/15 1928 12/06/15 0533  AST 26 21  ALT 14 15  ALKPHOS 240* 100  BILITOT 0.5 0.5  ALBUMIN 1.2* 1.1*    Cardiac Enzymes  Recent Labs Lab 12/05/15 0400 12/05/15 1110  TROPONINI 0.27* 0.18*    Glucose  Recent Labs Lab 12/08/15 1117 12/08/15 1545 12/08/15 2021 12/09/15 0029 12/09/15 0422 12/09/15 0817  GLUCAP 86 91 85 90 87 87    Imaging No results found. STUDIES:  CXR 05/04 > left HCAP  CULTURES: Blood reflex 05/04 >> klebsiella pneumonia, enterococcus, pseudomonas aeruginosa and carbapenem resistance BCx2 5/4 >>  gram neg rod>>> Urine 05/04 >> Sputum 05/04 >>pseudomons aur  ANTIBIOTICS: Vanc 05/04 >> Oral Vanc 05/04>>05/08 ( PEG d/c'd) Zosyn 05/04 >>off Cipro 05/04 >>off Flagyl 5/5>> Ceftaz/ avibactam 5/5 >>  SIGNIFICANT EVENTS: 5/04  Admitted with septic shock presumed due to HCAP as well as anemia (Hgb 6.1) 5/6- collapse, peep, dnr 5/7- some improved aeration left  LINES/TUBES: Trach (chronic) >> J tube (chronic) >> removed  L PICC (chronic) >> will remove 05/04 as it is leaking. CVL L IJ 5/04 >>  DISCUSSION: 70 y.o. F with  chronic trach / J tube from [facility], admitted 05/04 with septic shock and anemia.DNR status established  5/6, no escalation of care. Transfer to stepdown bed when available ASSESSMENT / PLAN:  PULMONARY A: Chronic tracheostomy / vent dependence. LLL HCAP. Hx COPD. Cuff overdistention? New collapse / effusion / ATX left - PNA Comfortable on 40%, rate of 16  P:   Peep to 5   CARDIOVASCULAR A:  Septic shock - presumed due to HCAP. Hemodynamically stable P:  Off  levophed , will NOT restart given family discussions    INFECTIOUS A:   Septic shock - presumed due to HCAP.  Hx pseudomonal and coag neg staph bacteremia last admission in Feb GNR / GPC Bacteremia  Hx C.diff. PNA left withj collapse, pseudomonas Not receiving PO Vanc 2/2 PEG removal( ID aware) P:   Abx as above per ID   HEMATOLOGIC A:   Anemia - acute on chronic.  Hgb now down to 6.1. VTE Prophylaxis. Hx DVT. Sacral decubitus ulcer stage IV P:  SCD's / Heparin  NEUROLOGIC A:   AMS - unclear etiology, likely due to sepsis. Hx anxiety / depression, chronic pain. Pain and suffering Medically  Ineffective to have heroics P:   Add low dose ativan, prior med sertraline. Continue Namenda   RENAL A:   AKI - in setting of shock. P:   NS @ 50   GASTROINTESTINAL A:   GI prophylaxis. Protein cal -malnutrition. P:   SUP: Pantoprazole. Hold off PEJ replacement  ENDOCRINE A:   DM. P:   SSI.   Family updated: pall care talking to sons, I also spoke to them./ NO escallation,  Comfort planned  Interdisciplinary Family Meeting v Palliative Care Meeting:  Done, 5/7  [facility] available as needed  Bevelyn NgoSarah F. Liona Wengert, AGACNP-BC [facility] Pulmonary/Critical Care Medicine Pager  [phone] 12/09/2015

## 2015-12-09 NOTE — Progress Notes (Signed)
INFECTIOUS DISEASE PROGRESS NOTE  ID: Kristen Arnold is a 70 y.o. female with  Active Problems:   Sepsis ([place])   Encounter for central line placement   Clostridium difficile colitis   CRKP (carbapenem-resistant Klebsiella pneumoniae) infection   Palliative care encounter   Acute respiratory failure ([place])  Subjective: Awake and alert,  Trying to mouth words.   Abtx:  Anti-infectives    Start     Dose/Rate Route Frequency Ordered Stop   12/08/15 1600  gentamicin (GARAMYCIN) 360 mg in dextrose 5 % 100 mL IVPB     360 mg 109 mL/hr over 60 Minutes Intravenous  Once 12/08/15 1457 12/08/15 1716   12/06/15 2200  levofloxacin (LEVAQUIN) IVPB 500 mg  Status:  Discontinued     500 mg 100 mL/hr over 60 Minutes Intravenous Every 48 hours 12/04/15 2229 12/04/15 2229   12/06/15 1600  vancomycin (VANCOCIN) 50 mg/mL oral solution 500 mg     500 mg Oral Every 6 hours 12/06/15 1451 12/20/15 1159   12/06/15 1445  vancomycin (VANCOCIN) 50 mg/mL oral solution 500 mg  Status:  Discontinued     500 mg Oral Every 6 hours 12/06/15 1436 12/06/15 1451   12/05/15 2000  vancomycin (VANCOCIN) 500 mg in sodium chloride 0.9 % 100 mL IVPB     500 mg 100 mL/hr over 60 Minutes Intravenous Every 24 hours 12/04/15 2229     12/05/15 1800  vancomycin (VANCOCIN) 50 mg/mL oral solution 500 mg  Status:  Discontinued     500 mg Oral Every 6 hours 12/05/15 1531 12/05/15 1606   12/05/15 1700  metroNIDAZOLE (FLAGYL) IVPB 500 mg     500 mg 100 mL/hr over 60 Minutes Intravenous Every 8 hours 12/05/15 1606     12/05/15 1100  ceftazidime-avibactam (AVYCAZ) 0.94 g in dextrose 5 % 50 mL IVPB     0.94 g 25 mL/hr over 2 Hours Intravenous Every 12 hours 12/05/15 1008     12/05/15 1015  ceftazidime-avibactam (AVYCAZ) 0.94 g in dextrose 5 % 50 mL IVPB  Status:  Discontinued     0.94 g 25 mL/hr over 2 Hours Intravenous Every 12 hours 12/05/15 1008 12/05/15 1008   12/05/15 0400  piperacillin-tazobactam (ZOSYN) IVPB 3.375 g   Status:  Discontinued     3.375 g 12.5 mL/hr over 240 Minutes Intravenous Every 8 hours 12/04/15 2229 12/05/15 1006   12/04/15 2300  ciprofloxacin (CIPRO) IVPB 200 mg  Status:  Discontinued     200 mg 100 mL/hr over 60 Minutes Intravenous Every 24 hours 12/04/15 2235 12/05/15 1006   12/04/15 2230  levofloxacin (LEVAQUIN) IVPB 750 mg  Status:  Discontinued     750 mg 100 mL/hr over 90 Minutes Intravenous  Once 12/04/15 2229 12/04/15 2229   12/04/15 1930  vancomycin (VANCOCIN) IVPB 1000 mg/200 mL premix     1,000 mg 200 mL/hr over 60 Minutes Intravenous  Once 12/04/15 1924 12/04/15 2041   12/04/15 1930  piperacillin-tazobactam (ZOSYN) IVPB 3.375 g     3.375 g 100 mL/hr over 30 Minutes Intravenous  Once 12/04/15 1924 12/04/15 1958      Medications:  Scheduled: . antiseptic oral rinse  7 mL Mouth Rinse QID  . ceftazidime avibactam (AVYCAZ) IVPB  0.94 g Intravenous Q12H  . chlorhexidine gluconate (SAGE KIT)  15 mL Mouth Rinse BID  . Chlorhexidine Gluconate Cloth  6 each Topical Q0600  . collagenase   Topical Daily  . heparin  5,000 Units Subcutaneous Q8H  .  insulin aspart  0-9 Units Subcutaneous Q4H  . metronidazole  500 mg Intravenous Q8H  . mupirocin ointment  1 application Nasal BID  . vancomycin  500 mg Oral Q6H  . vancomycin  500 mg Intravenous Q24H    Objective: Vital signs in last 24 hours: Temp:  [97.4 F (36.3 C)-98.5 F (36.9 C)] 97.7 F (36.5 C) (05/09 0820) Pulse Rate:  [65-89] 87 (05/09 1000) Resp:  [16-27] 27 (05/09 1000) BP: (87-125)/(49-73) 125/73 mmHg (05/09 1000) SpO2:  [93 %-100 %] 97 % (05/09 1000) FiO2 (%):  [30 %-50 %] 40 % (05/09 0837) Weight:  [50.2 kg (110 lb 10.7 oz)] 50.2 kg (110 lb 10.7 oz) (05/09 0400)   General appearance: alert and moderate distress Resp: rhonchi bilaterally Cardio: regular rate and rhythm GI: abnormal findings:  distended and hypoactive bowel sounds  Lab Results No results for input(s): WBC, HGB, HCT, NA, K, CL, CO2,  BUN, CREATININE, GLU in the last 72 hours.  Invalid input(s): PLATELETS Liver Panel No results for input(s): PROT, ALBUMIN, AST, ALT, ALKPHOS, BILITOT, BILIDIR, IBILI in the last 72 hours. Sedimentation Rate No results for input(s): ESRSEDRATE in the last 72 hours. C-Reactive Protein No results for input(s): CRP in the last 72 hours.  Microbiology: Recent Results (from the past 240 hour(s))  Blood Culture (routine x 2)     Status: Abnormal (Preliminary result)   Collection Time: 12/04/15  7:28 PM  Result Value Ref Range Status   Specimen Description BLOOD PICC LINE  Final   Special Requests BOTTLES DRAWN AEROBIC AND ANAEROBIC 5CC  Final   Culture  Setup Time   Final    Organism ID to follow [place] IN BOTH AEROBIC AND ANAEROBIC BOTTLES CRITICAL RESULT CALLED TO, READ BACK BY AND VERIFIED WITH: C. STEWART, PHARM D AT [place] ON [id number] BY Rhea Bleacher    Culture (A)  Final    KLEBSIELLA PNEUMONIAE CARBAPENEMASE RESISTANT ENTEROBACTERIACAE PSEUDOMONAS AERUGINOSA NOTIFIED M DAVIS W/ INFECTION PREVENTION 12/08/15 @ 1100 M VESTAL RESULT CALLED TO, READ BACK BY AND VERIFIED WITH: DR HATCHER 12/08/15 @ 1030 M VESTAL CULTURE REINCUBATED FOR BETTER GROWTH    Report Status PENDING  Incomplete   Organism ID, Bacteria KLEBSIELLA PNEUMONIAE  Final   Organism ID, Bacteria PSEUDOMONAS AERUGINOSA  Final      Susceptibility   Klebsiella pneumoniae - MIC*    AMPICILLIN >=32 RESISTANT Resistant     CEFAZOLIN >=64 RESISTANT Resistant     CEFEPIME 16 RESISTANT Resistant     CEFTAZIDIME >=64 RESISTANT Resistant     CEFTRIAXONE >=64 RESISTANT Resistant     CIPROFLOXACIN >=4 RESISTANT Resistant     GENTAMICIN >=16 RESISTANT Resistant     IMIPENEM 8 RESISTANT Resistant     TRIMETH/SULFA >=320 RESISTANT Resistant     AMPICILLIN/SULBACTAM >=32 RESISTANT Resistant     PIP/TAZO >=128 RESISTANT Resistant     * KLEBSIELLA PNEUMONIAE   Pseudomonas aeruginosa - MIC*     CEFTAZIDIME >=64 RESISTANT Resistant     CIPROFLOXACIN 2 INTERMEDIATE Intermediate     GENTAMICIN <=1 SENSITIVE Sensitive     IMIPENEM >=16 RESISTANT Resistant     CEFEPIME 32 RESISTANT Resistant     * PSEUDOMONAS AERUGINOSA  Blood Culture (routine x 2)     Status: Abnormal (Preliminary result)   Collection Time: 12/04/15  7:28 PM  Result Value Ref Range Status   Specimen Description BLOOD PICC LINE  Final   Special Requests BOTTLES DRAWN AEROBIC AND  ANAEROBIC 5CC  Final   Culture  Setup Time   Final    GRAM POSITIVE COCCI GRAM NEGATIVE RODS IN BOTH AEROBIC AND ANAEROBIC BOTTLES CRITICAL RESULT CALLED TO, READ BACK BY AND VERIFIED WITH: C. STEWART, PHARM D AT 0855 ON [id number] BY Rhea Bleacher    Culture (A)  Final    KLEBSIELLA PNEUMONIAE PSEUDOMONAS AERUGINOSA SUSCEPTIBILITIES PERFORMED ON PREVIOUS CULTURE WITHIN THE LAST 5 DAYS. CULTURE REINCUBATED FOR BETTER GROWTH    Report Status PENDING  Incomplete  Blood Culture ID Panel (Reflexed)     Status: Abnormal   Collection Time: 12/04/15  7:28 PM  Result Value Ref Range Status   Enterococcus species DETECTED (A) NOT DETECTED Final    Comment: CRITICAL RESULT CALLED TO, READ BACK BY AND VERIFIED WITH: C. STEWART, PHARM D AT 0855 ON [id number] BY S. YARBROUGH    Vancomycin resistance NOT DETECTED NOT DETECTED Final   Listeria monocytogenes NOT DETECTED NOT DETECTED Final   Staphylococcus species NOT DETECTED NOT DETECTED Final   Staphylococcus aureus NOT DETECTED NOT DETECTED Final   Methicillin resistance NOT DETECTED NOT DETECTED Final   Streptococcus species NOT DETECTED NOT DETECTED Final   Streptococcus agalactiae NOT DETECTED NOT DETECTED Final   Streptococcus pneumoniae NOT DETECTED NOT DETECTED Final   Streptococcus pyogenes NOT DETECTED NOT DETECTED Final   Acinetobacter baumannii NOT DETECTED NOT DETECTED Final   Enterobacteriaceae species NOT DETECTED NOT DETECTED Final   Enterobacter cloacae complex NOT DETECTED NOT  DETECTED Final   Escherichia coli NOT DETECTED NOT DETECTED Final   Klebsiella oxytoca NOT DETECTED NOT DETECTED Final   Klebsiella pneumoniae DETECTED (A) NOT DETECTED Final    Comment: CRITICAL RESULT CALLED TO, READ BACK BY AND VERIFIED WITH: C. STEWART,PHARM D AT 0855 ON [id number] BY S. YARBROUGH    Proteus species NOT DETECTED NOT DETECTED Final   Serratia marcescens NOT DETECTED NOT DETECTED Final   Carbapenem resistance DETECTED (A) NOT DETECTED Final    Comment: CRITICAL RESULT CALLED TO, READ BACK BY AND VERIFIED WITH: C. STEWART,PHARM D AT 0855 ON [id number] BY S. YARBROUGH AND NOTIFIED NATALIE DAVIS IN INFECTION CONTROL AT 0900 ON [id number] BY S. YARBROUGH    Haemophilus influenzae NOT DETECTED NOT DETECTED Final   Neisseria meningitidis NOT DETECTED NOT DETECTED Final   Pseudomonas aeruginosa DETECTED (A) NOT DETECTED Final    Comment: CRITICAL RESULT CALLED TO, READ BACK BY AND VERIFIED WITH: CLamont Snowball D AT 0855 ON [id number] BY S. YARBROUGH    Candida albicans NOT DETECTED NOT DETECTED Final   Candida glabrata NOT DETECTED NOT DETECTED Final   Candida krusei NOT DETECTED NOT DETECTED Final   Candida parapsilosis NOT DETECTED NOT DETECTED Final   Candida tropicalis NOT DETECTED NOT DETECTED Final  Urine culture     Status: None   Collection Time: 12/04/15  7:48 PM  Result Value Ref Range Status   Specimen Description URINE, CATHETERIZED  Final   Special Requests NONE  Final   Culture NO GROWTH 1 DAY  Final   Report Status 12/06/2015 FINAL  Final  MRSA PCR Screening     Status: Abnormal   Collection Time: 12/05/15 12:04 AM  Result Value Ref Range Status   MRSA by PCR POSITIVE (A) NEGATIVE Final    Comment:        The GeneXpert MRSA Assay (FDA approved for NASAL specimens only), is one component of a comprehensive MRSA colonization surveillance program. It is not intended to diagnose MRSA infection nor to  guide or monitor treatment for MRSA infections. RESULT CALLED  TO, READ BACK BY AND VERIFIED WITH: Filbert Berthold RN 10:40 12/05/15 ([email or web address])   Wound culture     Status: None (Preliminary result)   Collection Time: 12/05/15  7:30 AM  Result Value Ref Range Status   Specimen Description WOUND  Final   Special Requests SACRAL  Final   Gram Stain   Final    FEW WBC PRESENT, PREDOMINANTLY PMN RARE SQUAMOUS EPITHELIAL CELLS PRESENT ABUNDANT GRAM NEGATIVE RODS MODERATE GRAM POSITIVE COCCI IN PAIRS FEW GRAM POSITIVE RODS Performed at [facility]    Culture   Final    ABUNDANT PSEUDOMONAS AERUGINOSA Performed at [facility]    Report Status PENDING  Incomplete  C difficile quick scan w PCR reflex     Status: Abnormal   Collection Time: 12/05/15  6:01 PM  Result Value Ref Range Status   C Diff antigen POSITIVE (A) NEGATIVE Final   C Diff toxin POSITIVE (A) NEGATIVE Final   C Diff interpretation Positive for toxigenic C. difficile  Final    Comment: CRITICAL RESULT CALLED TO, READ BACK BY AND VERIFIED WITH: Bryson Dames RN 1929 12/05/15 A BROWNING   Culture, respiratory (tracheal aspirate)     Status: None   Collection Time: 12/05/15  8:53 PM  Result Value Ref Range Status   Specimen Description TRACHEAL ASPIRATE  Final   Special Requests NONE  Final   Gram Stain   Final    ABUNDANT WBC PRESENT, PREDOMINANTLY PMN FEW SQUAMOUS EPITHELIAL CELLS PRESENT ABUNDANT GRAM NEGATIVE RODS Performed at [facility]    Culture   Final    MODERATE PSEUDOMONAS AERUGINOSA Performed at [facility]    Report Status 12/08/2015 FINAL  Final   Organism ID, Bacteria PSEUDOMONAS AERUGINOSA  Final      Susceptibility   Pseudomonas aeruginosa - MIC*    CEFEPIME 16 INTERMEDIATE Intermediate     CEFTAZIDIME 32 RESISTANT Resistant     CIPROFLOXACIN 1 SENSITIVE Sensitive     GENTAMICIN <=1 SENSITIVE Sensitive     IMIPENEM >=16 RESISTANT Resistant     TOBRAMYCIN <=1 SENSITIVE Sensitive     * MODERATE PSEUDOMONAS AERUGINOSA     Studies/Results: No results found.   Assessment/Plan: Polymicrobial BCx- pseudomonas, klebsiella (KPC), enterococcus (non-VRE).  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 Cont flagyl IV til we can get po access for po vanco.  Will repeat BCx No change in her PEG while EOL discussions.  Gent x 1  Total days of antibiotics: 5 avycaz, flagyl, vanco         Bobby Rumpf Infectious Diseases (pager) [phone] [email or web address] 12/09/2015, 10:25 AM  LOS: 5 days

## 2015-12-10 LAB — CULTURE, BLOOD (ROUTINE X 2)

## 2015-12-10 LAB — GLUCOSE, CAPILLARY
GLUCOSE-CAPILLARY: 96 mg/dL (ref 65–99)
Glucose-Capillary: 103 mg/dL — ABNORMAL HIGH (ref 65–99)
Glucose-Capillary: 106 mg/dL — ABNORMAL HIGH (ref 65–99)
Glucose-Capillary: 117 mg/dL — ABNORMAL HIGH (ref 65–99)
Glucose-Capillary: 119 mg/dL — ABNORMAL HIGH (ref 65–99)
Glucose-Capillary: 75 mg/dL (ref 65–99)
Glucose-Capillary: 99 mg/dL (ref 65–99)

## 2015-12-10 LAB — WOUND CULTURE

## 2015-12-10 LAB — GENTAMICIN LEVEL, RANDOM: Gentamicin Rm: 4.3 ug/mL

## 2015-12-10 MED ORDER — FAMOTIDINE 40 MG/5ML PO SUSR: 20.0000 mg | Freq: Two times a day (BID) | ORAL | Status: DC

## 2015-12-10 MED ORDER — DEXTROSE 5 % IV SOLN
1.2500 g | Freq: Three times a day (TID) | INTRAVENOUS | Status: DC
  Administered 2015-12-10 – 2015-12-18 (×25): 1.25 g via INTRAVENOUS
  Filled 2015-12-10 (×42): qty 6

## 2015-12-10 MED ORDER — DEXTROSE-NACL 5-0.2 % IV SOLN
INTRAVENOUS | Status: DC
  Administered 2015-12-10: 1000 mL via INTRAVENOUS
  Administered 2015-12-11: 08:00:00 via INTRAVENOUS

## 2015-12-10 MED ORDER — LORAZEPAM 2 MG/ML IJ SOLN: 1.0000 mg | INTRAMUSCULAR | Status: DC | PRN

## 2015-12-10 MED ORDER — FAMOTIDINE IN NACL 20-0.9 MG/50ML-% IV SOLN
20.0000 mg | Freq: Two times a day (BID) | INTRAVENOUS | Status: DC
  Administered 2015-12-10 – 2015-12-16 (×12): 20 mg via INTRAVENOUS
  Filled 2015-12-10 (×12): qty 50

## 2015-12-10 MED ORDER — LORAZEPAM 2 MG/ML IJ SOLN
0.5000 mg | INTRAMUSCULAR | Status: DC | PRN
  Administered 2015-12-10 – 2015-12-18 (×9): 1 mg via INTRAVENOUS
  Filled 2015-12-10 (×10): qty 1

## 2015-12-10 MED ORDER — GENTAMICIN IN SALINE 1-0.9 MG/ML-% IV SOLN
100.0000 mg | INTRAVENOUS | Status: DC
  Administered 2015-12-10 – 2015-12-11 (×2): 100 mg via INTRAVENOUS
  Filled 2015-12-10 (×3): qty 100

## 2015-12-10 NOTE — Progress Notes (Signed)
INFECTIOUS DISEASE PROGRESS NOTE  ID: Kristen Arnold is a 70 y.o. female with  Active Problems:   Sepsis ([place])   Encounter for central line placement   Clostridium difficile colitis   CRKP (carbapenem-resistant Klebsiella pneumoniae) infection   Palliative care encounter   Acute respiratory failure ([place])   Bacteremia due to Enterococcus   Bacteremia due to Klebsiella pneumoniae   Bacteremia due to Pseudomonas   C. difficile colitis  Subjective: Awake and alert.   Abtx:  Anti-infectives    Start     Dose/Rate Route Frequency Ordered Stop   12/10/15 2100  gentamicin (GARAMYCIN) IVPB 100 mg     100 mg 200 mL/hr over 30 Minutes Intravenous Every 24 hours 12/10/15 1056     12/10/15 1200  ceftazidime-avibactam (AVYCAZ) 1.25 g in dextrose 5 % 50 mL IVPB     1.25 g 25 mL/hr over 2 Hours Intravenous Every 8 hours 12/10/15 1057     12/09/15 2300  vancomycin (VANCOCIN) 500 mg in sodium chloride 0.9 % 100 mL IVPB     500 mg 100 mL/hr over 60 Minutes Intravenous Every 24 hours 12/09/15 2109     12/08/15 1600  gentamicin (GARAMYCIN) 360 mg in dextrose 5 % 100 mL IVPB     360 mg 109 mL/hr over 60 Minutes Intravenous  Once 12/08/15 1457 12/08/15 1716   12/06/15 2200  levofloxacin (LEVAQUIN) IVPB 500 mg  Status:  Discontinued     500 mg 100 mL/hr over 60 Minutes Intravenous Every 48 hours 12/04/15 2229 12/04/15 2229   12/06/15 1600  vancomycin (VANCOCIN) 50 mg/mL oral solution 500 mg     500 mg Oral Every 6 hours 12/06/15 1451 12/20/15 1159   12/06/15 1445  vancomycin (VANCOCIN) 50 mg/mL oral solution 500 mg  Status:  Discontinued     500 mg Oral Every 6 hours 12/06/15 1436 12/06/15 1451   12/05/15 2000  vancomycin (VANCOCIN) 500 mg in sodium chloride 0.9 % 100 mL IVPB  Status:  Discontinued     500 mg 100 mL/hr over 60 Minutes Intravenous Every 24 hours 12/04/15 2229 12/09/15 2109   12/05/15 1800  vancomycin (VANCOCIN) 50 mg/mL oral solution 500 mg  Status:  Discontinued     500 mg  Oral Every 6 hours 12/05/15 1531 12/05/15 1606   12/05/15 1700  metroNIDAZOLE (FLAGYL) IVPB 500 mg     500 mg 100 mL/hr over 60 Minutes Intravenous Every 8 hours 12/05/15 1606     12/05/15 1100  ceftazidime-avibactam (AVYCAZ) 0.94 g in dextrose 5 % 50 mL IVPB  Status:  Discontinued     0.94 g 25 mL/hr over 2 Hours Intravenous Every 12 hours 12/05/15 1008 12/10/15 1057   12/05/15 1015  ceftazidime-avibactam (AVYCAZ) 0.94 g in dextrose 5 % 50 mL IVPB  Status:  Discontinued     0.94 g 25 mL/hr over 2 Hours Intravenous Every 12 hours 12/05/15 1008 12/05/15 1008   12/05/15 0400  piperacillin-tazobactam (ZOSYN) IVPB 3.375 g  Status:  Discontinued     3.375 g 12.5 mL/hr over 240 Minutes Intravenous Every 8 hours 12/04/15 2229 12/05/15 1006   12/04/15 2300  ciprofloxacin (CIPRO) IVPB 200 mg  Status:  Discontinued     200 mg 100 mL/hr over 60 Minutes Intravenous Every 24 hours 12/04/15 2235 12/05/15 1006   12/04/15 2230  levofloxacin (LEVAQUIN) IVPB 750 mg  Status:  Discontinued     750 mg 100 mL/hr over 90 Minutes Intravenous  Once 12/04/15 2229 12/04/15  2229   12/04/15 1930  vancomycin (VANCOCIN) IVPB 1000 mg/200 mL premix     1,000 mg 200 mL/hr over 60 Minutes Intravenous  Once 12/04/15 1924 12/04/15 2041   12/04/15 1930  piperacillin-tazobactam (ZOSYN) IVPB 3.375 g     3.375 g 100 mL/hr over 30 Minutes Intravenous  Once 12/04/15 1924 12/04/15 1958      Medications:  Scheduled: . antiseptic oral rinse  7 mL Mouth Rinse QID  . ceftazidime avibactam (AVYCAZ) IVPB  1.25 g Intravenous Q8H  . chlorhexidine gluconate (SAGE KIT)  15 mL Mouth Rinse BID  . Chlorhexidine Gluconate Cloth  6 each Topical Q0600  . collagenase   Topical Daily  . gentamicin  100 mg Intravenous Q24H  . heparin  5,000 Units Subcutaneous Q8H  . insulin aspart  0-9 Units Subcutaneous Q4H  . metronidazole  500 mg Intravenous Q8H  . mupirocin ointment  1 application Nasal BID  . vancomycin  500 mg Oral Q6H  .  vancomycin  500 mg Intravenous Q24H    Objective: Vital signs in last 24 hours: Temp:  [97.5 F (36.4 C)-97.8 F (36.6 C)] 97.5 F (36.4 C) (05/10 1252) Pulse Rate:  [73-100] 89 (05/10 1252) Resp:  [15-28] 26 (05/10 1252) BP: (94-144)/(50-83) 125/62 mmHg (05/10 1252) SpO2:  [92 %-100 %] 97 % (05/10 1252) FiO2 (%):  [40 %] 40 % (05/10 1217) Weight:  [49.8 kg (109 lb 12.6 oz)-53.7 kg (118 lb 6.2 oz)] 53.7 kg (118 lb 6.2 oz) (05/10 0311)   General appearance: alert and no distress Neck: L neck IJ clean Resp: diminished breath sounds bilaterally Cardio: regular rate and rhythm GI: normal findings: bowel sounds normal and soft, non-tender Extremities: edema none  Lab Results  Recent Labs  12/09/15 2000  NA 150*  K 3.6  CL 115*  CO2 22  BUN 63*  CREATININE 1.04*   Liver Panel No results for input(s): PROT, ALBUMIN, AST, ALT, ALKPHOS, BILITOT, BILIDIR, IBILI in the last 72 hours. Sedimentation Rate No results for input(s): ESRSEDRATE in the last 72 hours. C-Reactive Protein No results for input(s): CRP in the last 72 hours.  Microbiology: Recent Results (from the past 240 hour(s))  Blood Culture (routine x 2)     Status: Abnormal   Collection Time: 12/04/15  7:28 PM  Result Value Ref Range Status   Specimen Description BLOOD PICC LINE  Final   Special Requests BOTTLES DRAWN AEROBIC AND ANAEROBIC 5CC  Final   Culture  Setup Time   Final    Organism ID to follow GRAM POSITIVE COCCI GRAM NEGATIVE RODS IN BOTH AEROBIC AND ANAEROBIC BOTTLES CRITICAL RESULT CALLED TO, READ BACK BY AND VERIFIED WITH: C. STEWART, PHARM D AT 0855 ON [id number] BY Rhea Bleacher    Culture (A)  Final    KLEBSIELLA PNEUMONIAE CARBAPENEMASE RESISTANT ENTEROBACTERIACAE PSEUDOMONAS AERUGINOSA ENTEROCOCCUS SPECIES RESULT CALLED TO, READ BACK BY AND VERIFIED WITH: DR Sidney Kann 12/08/15 @ 56 M VESTAL NOTIFIED M DAVIS W/ INFECTION PREVENTION 12/08/15 @ 1100 M VESTAL    Report Status 12/10/2015 FINAL   Final   Organism ID, Bacteria KLEBSIELLA PNEUMONIAE  Final   Organism ID, Bacteria PSEUDOMONAS AERUGINOSA  Final   Organism ID, Bacteria ENTEROCOCCUS SPECIES  Final      Susceptibility   Klebsiella pneumoniae - MIC*    AMPICILLIN >=32 RESISTANT Resistant     CEFAZOLIN >=64 RESISTANT Resistant     CEFEPIME 16 RESISTANT Resistant     CEFTAZIDIME >=64 RESISTANT Resistant  CEFTRIAXONE >=64 RESISTANT Resistant     CIPROFLOXACIN >=4 RESISTANT Resistant     GENTAMICIN >=16 RESISTANT Resistant     IMIPENEM 8 RESISTANT Resistant     TRIMETH/SULFA >=320 RESISTANT Resistant     AMPICILLIN/SULBACTAM >=32 RESISTANT Resistant     PIP/TAZO >=128 RESISTANT Resistant     * KLEBSIELLA PNEUMONIAE   Pseudomonas aeruginosa - MIC*    CEFTAZIDIME >=64 RESISTANT Resistant     CIPROFLOXACIN 2 INTERMEDIATE Intermediate     GENTAMICIN <=1 SENSITIVE Sensitive     IMIPENEM >=16 RESISTANT Resistant     CEFEPIME 32 RESISTANT Resistant     * PSEUDOMONAS AERUGINOSA   Enterococcus species - MIC*    AMPICILLIN 4 RESISTANT Resistant     VANCOMYCIN 4 SENSITIVE Sensitive     GENTAMICIN SYNERGY RESISTANT Resistant     * ENTEROCOCCUS SPECIES  Blood Culture (routine x 2)     Status: Abnormal   Collection Time: 12/04/15  7:28 PM  Result Value Ref Range Status   Specimen Description BLOOD PICC LINE  Final   Special Requests BOTTLES DRAWN AEROBIC AND ANAEROBIC 5CC  Final   Culture  Setup Time   Final    GRAM POSITIVE COCCI GRAM NEGATIVE RODS IN BOTH AEROBIC AND ANAEROBIC BOTTLES CRITICAL RESULT CALLED TO, READ BACK BY AND VERIFIED WITH: C. STEWART, PHARM D AT 0855 ON [id number] BY Rhea Bleacher    Culture (A)  Final    KLEBSIELLA PNEUMONIAE PSEUDOMONAS AERUGINOSA ENTEROCOCCUS SPECIES SUSCEPTIBILITIES PERFORMED ON PREVIOUS CULTURE WITHIN THE LAST 5 DAYS.    Report Status 12/10/2015 FINAL  Final  Blood Culture ID Panel (Reflexed)     Status: Abnormal   Collection Time: 12/04/15  7:28 PM  Result Value Ref Range  Status   Enterococcus species DETECTED (A) NOT DETECTED Final    Comment: CRITICAL RESULT CALLED TO, READ BACK BY AND VERIFIED WITH: C. STEWART, PHARM D AT 0855 ON [id number] BY S. YARBROUGH    Vancomycin resistance NOT DETECTED NOT DETECTED Final   Listeria monocytogenes NOT DETECTED NOT DETECTED Final   Staphylococcus species NOT DETECTED NOT DETECTED Final   Staphylococcus aureus NOT DETECTED NOT DETECTED Final   Methicillin resistance NOT DETECTED NOT DETECTED Final   Streptococcus species NOT DETECTED NOT DETECTED Final   Streptococcus agalactiae NOT DETECTED NOT DETECTED Final   Streptococcus pneumoniae NOT DETECTED NOT DETECTED Final   Streptococcus pyogenes NOT DETECTED NOT DETECTED Final   Acinetobacter baumannii NOT DETECTED NOT DETECTED Final   Enterobacteriaceae species NOT DETECTED NOT DETECTED Final   Enterobacter cloacae complex NOT DETECTED NOT DETECTED Final   Escherichia coli NOT DETECTED NOT DETECTED Final   Klebsiella oxytoca NOT DETECTED NOT DETECTED Final   Klebsiella pneumoniae DETECTED (A) NOT DETECTED Final    Comment: CRITICAL RESULT CALLED TO, READ BACK BY AND VERIFIED WITH: C. STEWART,PHARM D AT 0855 ON [id number] BY S. YARBROUGH    Proteus species NOT DETECTED NOT DETECTED Final   Serratia marcescens NOT DETECTED NOT DETECTED Final   Carbapenem resistance DETECTED (A) NOT DETECTED Final    Comment: CRITICAL RESULT CALLED TO, READ BACK BY AND VERIFIED WITH: C. STEWART,PHARM D AT 0855 ON [id number] BY S. YARBROUGH AND NOTIFIED NATALIE DAVIS IN INFECTION CONTROL AT 0900 ON [id number] BY S. YARBROUGH    Haemophilus influenzae NOT DETECTED NOT DETECTED Final   Neisseria meningitidis NOT DETECTED NOT DETECTED Final   Pseudomonas aeruginosa DETECTED (A) NOT DETECTED Final    Comment: CRITICAL RESULT CALLED TO, READ BACK  BY AND VERIFIED WITH: Raenette Rover AT 0855 ON [id number] BY S. YARBROUGH    Candida albicans NOT DETECTED NOT DETECTED Final   Candida glabrata NOT  DETECTED NOT DETECTED Final   Candida krusei NOT DETECTED NOT DETECTED Final   Candida parapsilosis NOT DETECTED NOT DETECTED Final   Candida tropicalis NOT DETECTED NOT DETECTED Final  Urine culture     Status: None   Collection Time: 12/04/15  7:48 PM  Result Value Ref Range Status   Specimen Description URINE, CATHETERIZED  Final   Special Requests NONE  Final   Culture NO GROWTH 1 DAY  Final   Report Status 12/06/2015 FINAL  Final  MRSA PCR Screening     Status: Abnormal   Collection Time: 12/05/15 12:04 AM  Result Value Ref Range Status   MRSA by PCR POSITIVE (A) NEGATIVE Final    Comment:        The GeneXpert MRSA Assay (FDA approved for NASAL specimens only), is one component of a comprehensive MRSA colonization surveillance program. It is not intended to diagnose MRSA infection nor to guide or monitor treatment for MRSA infections. RESULT CALLED TO, READ BACK BY AND VERIFIED WITH: Filbert Berthold RN 10:40 12/05/15 ([email or web address])   Wound culture     Status: None   Collection Time: 12/05/15  7:30 AM  Result Value Ref Range Status   Specimen Description WOUND  Final   Special Requests SACRAL  Final   Gram Stain   Final    FEW WBC PRESENT, PREDOMINANTLY PMN RARE SQUAMOUS EPITHELIAL CELLS PRESENT ABUNDANT GRAM NEGATIVE RODS MODERATE GRAM POSITIVE COCCI IN PAIRS FEW GRAM POSITIVE RODS Performed at [facility]    Culture   Final    ABUNDANT PSEUDOMONAS AERUGINOSA DRUG RESISTANT Note: COLISTIN SENSITIVE 2 ug/mL ETEST results for this drug are "FOR INVESTIGATIONAL USE ONLY" and should NOT be used for clinical purposes. MUTLI CRITICAL RESULT CALLED TO, READ BACK BY AND VERIFIED WITH: CAROLYN (RN)@ 10:58AM ON  12/10/2015 HAJAM Performed at [facility]    Report Status 12/10/2015 FINAL  Final   Organism ID, Bacteria PSEUDOMONAS AERUGINOSA  Final      Susceptibility   Pseudomonas aeruginosa - MIC*    CEFEPIME 32 RESISTANT Resistant     CEFTAZIDIME >=64 RESISTANT  Resistant     CIPROFLOXACIN >=4 RESISTANT Resistant     GENTAMICIN <=1 SENSITIVE Sensitive     IMIPENEM >=16 RESISTANT Resistant     TOBRAMYCIN <=1 SENSITIVE Sensitive     * ABUNDANT PSEUDOMONAS AERUGINOSA  C difficile quick scan w PCR reflex     Status: Abnormal   Collection Time: 12/05/15  6:01 PM  Result Value Ref Range Status   C Diff antigen POSITIVE (A) NEGATIVE Final   C Diff toxin POSITIVE (A) NEGATIVE Final   C Diff interpretation Positive for toxigenic C. difficile  Final    Comment: CRITICAL RESULT CALLED TO, READ BACK BY AND VERIFIED WITH: Bryson Dames RN 1975 12/05/15 A BROWNING   Culture, respiratory (tracheal aspirate)     Status: None   Collection Time: 12/05/15  8:53 PM  Result Value Ref Range Status   Specimen Description TRACHEAL ASPIRATE  Final   Special Requests NONE  Final   Gram Stain   Final    ABUNDANT WBC PRESENT, PREDOMINANTLY PMN FEW SQUAMOUS EPITHELIAL CELLS PRESENT ABUNDANT GRAM NEGATIVE RODS Performed at [facility]    Culture   Final    MODERATE PSEUDOMONAS AERUGINOSA Performed at [facility]    Report Status 12/08/2015 FINAL  Final   Organism ID, Bacteria PSEUDOMONAS AERUGINOSA  Final      Susceptibility   Pseudomonas aeruginosa - MIC*    CEFEPIME 16 INTERMEDIATE Intermediate     CEFTAZIDIME 32 RESISTANT Resistant     CIPROFLOXACIN 1 SENSITIVE Sensitive     GENTAMICIN <=1 SENSITIVE Sensitive     IMIPENEM >=16 RESISTANT Resistant     TOBRAMYCIN <=1 SENSITIVE Sensitive     * MODERATE PSEUDOMONAS AERUGINOSA  Culture, blood (Routine X 2) w Reflex to ID Panel     Status: None (Preliminary result)   Collection Time: 12/09/15 12:00 PM  Result Value Ref Range Status   Specimen Description BLOOD RIGHT ARM  Final   Special Requests IN PEDIATRIC BOTTLE 3CC  Final   Culture  Setup Time   Final    GRAM POSITIVE COCCI IN CLUSTERS IN PEDIATRIC BOTTLE CRITICAL RESULT CALLED TO, READ BACK BY AND VERIFIED WITH: C [place] A T [id number] 12/10/15 BY  L BENFIELD    Culture PENDING  Incomplete   Report Status PENDING  Incomplete  Culture, blood (routine x 2)     Status: None (Preliminary result)   Collection Time: 12/09/15 12:20 PM  Result Value Ref Range Status   Specimen Description BLOOD RIGHT HAND  Final   Special Requests IN PEDIATRIC BOTTLE 3CC  Final   Culture PENDING  Incomplete   Report Status PENDING  Incomplete    Studies/Results: No results found.   Assessment/Plan: Polymicrobial BCx- pseudomonas, klebsiella (KPC), enterococcus (non-VRE).  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 Await sensi and ID of GPC in her 5-9 BCx Cont flagyl IV til we can get po access for po vanco.  Repeat BCx pending Cr improved.  Will watch while she is on gent No change in her PEG while EOL discussions.   Total days of antibiotics: 6 avycaz, flagyl, vanco 5-9 [facility] (pager) [phone] [email or web address] 12/10/2015, 12:53 PM  LOS: 6 days

## 2015-12-10 NOTE — Progress Notes (Addendum)
PULMONARY / CRITICAL CARE MEDICINE   Name: Kristen ChessJudy Ohanesian MRN: [id number] DOB: 01/31/1946    ADMISSION DATE:  12/04/2015 CONSULTATION DATE:  12/04/15  REFERRING MD:  EDP  CHIEF COMPLAINT:  AMS  70 year old woman transferred from [facility] due to being unresponsive for 24 hours. She was noted to be hypotensive with a hemoglobin of 6.1, chest x-ray showed a left lower lobe airspace disease. She had right hemicolectomy for small bowel obstruction in December 2016 and required prolonged mechanical ventilation and tracheostomy/ PEG placement. She was hospitalized in 09/2015 for aspiration pneumonia-blood cultures positive for coag-negative staph and Pseudomonas-related to pneumonia/ HCAP. she also has a history of recurrent Clostridium difficile diarrhea and severe protein calorie malnutrition    Subjective- Now transfer to SDU, remains on vent. Weaning this AM 10/5, RR up but otherwise tolerating well. Pain all over.  VITAL SIGNS: BP 132/65 mmHg  Pulse 93  Temp(Src) 97.6 F (36.4 C) (Oral)  Resp 28  Ht 5' (1.524 m)  Wt 53.7 kg (118 lb 6.2 oz)  BMI 23.12 kg/m2  SpO2 93%  HEMODYNAMICS:    VENTILATOR SETTINGS: Vent Mode:  [-] PSV;CPAP FiO2 (%):  [40 %] 40 % Set Rate:  [16 bmp] 16 bmp Vt Set:  [480 mL] 480 mL PEEP:  [5 cmH20] 5 cmH20 Pressure Support:  [10 cmH20] 10 cmH20 Plateau Pressure:  [16 cmH20-25 cmH20] 21 cmH20  INTAKE / OUTPUT: I/O last 3 completed shifts: In: 3147.5 [I.V.:2287.5; Other:110; IV Piggyback:750] Out: 1400 [Urine:1025; Other:375]   PHYSICAL EXAMINATION:  General: Chronically ill appearing elderly female, cachectic, in NAD,alert. Neuro: opens eyes spont,  Following commands, contracted uppers chronic. Attempting to speak HEENT: [place]/AT. PERRL, trach clean Cardiovascular: s1 s2 RRR  Lungs: coarse, left Abdomen: BS x 4, soft, NT/ND. PEG out, pouch to site with bilious drainage. Musculoskeletal: No gross deformities, no edema.  Skin: Intact, warm, no  rashes.   LABS:  BMET  Recent Labs Lab 12/05/15 0400 12/06/15 0533 12/09/15 2000  NA 139 141 150*  K 5.3* 4.1 3.6  CL 90* 95* 115*  CO2 34* 33* 22  BUN 164* 141* 63*  CREATININE 1.66* 1.38* 1.04*  GLUCOSE 254* 184* 123*    Electrolytes  Recent Labs Lab 12/05/15 0400 12/06/15 0533 12/09/15 2000  CALCIUM 8.1* 7.5* 7.6*  MG 2.3  --   --   PHOS 4.6  --   --     CBC  Recent Labs Lab 12/04/15 1928 12/05/15 0400 12/06/15 0533  WBC 39.4* 59.1* 35.1*  HGB 6.1* 7.3* 8.7*  HCT 20.8* 23.6* 26.3*  PLT 729* 653* 596*    Coag's No results for input(s): APTT, INR in the last 168 hours.  Sepsis Markers  Recent Labs Lab 12/04/15 2003 12/04/15 2230 12/05/15 1110  LATICACIDVEN 2.53* 3.4* 2.1*    ABG  Recent Labs Lab 12/04/15 1912 12/05/15 0325  PHART 7.356 7.341*  PCO2ART 82.7* 69.6*  PO2ART 72.0* 178*    Liver Enzymes  Recent Labs Lab 12/04/15 1928 12/06/15 0533  AST 26 21  ALT 14 15  ALKPHOS 240* 100  BILITOT 0.5 0.5  ALBUMIN 1.2* 1.1*    Cardiac Enzymes  Recent Labs Lab 12/05/15 0400 12/05/15 1110  TROPONINI 0.27* 0.18*    Glucose  Recent Labs Lab 12/09/15 1107 12/09/15 1630 12/09/15 1959 12/10/15 0019 12/10/15 0337 12/10/15 0819  GLUCAP 90 103* 108* 106* 103* 75    Imaging No results found. STUDIES:  CXR 05/04 > left HCAP  CULTURES: Blood reflex 05/04 >>  klebsiella pneumonia, enterococcus, pseudomonas aeruginosa and carbapenem resistance BCx2 5/4 >> gram neg rod>>> KLEBSIELLA PNEUMONIAE  PSEUDOMONAS AERUGINOSA  ENTEROCOCCUS SPECIES MDRO Urine 05/04 >> Sputum 05/04 >>pseudomons aur  ANTIBIOTICS: Vanc 05/04 >> Oral Vanc 05/04>>05/08 ( PEG d/c'd) Zosyn 05/04 >>off Cipro 05/04 >>off Flagyl 5/5>> Ceftaz/ avibactam 5/5 >>  SIGNIFICANT EVENTS: 5/04  Admitted with septic shock presumed due to HCAP as well as anemia (Hgb 6.1) 5/6- collapse, peep, dnr 5/7- some improved aeration left 5/9 to  SDU  LINES/TUBES: Trach (chronic) >> J tube (chronic) >> removed  L PICC (chronic) >> will remove 05/04 as it is leaking. CVL L IJ 5/04 >>  DISCUSSION: 70 y.o. F with chronic trach / J tube from [facility], admitted 05/04 with septic shock and anemia.DNR status established  5/6, no escalation of care.   ASSESSMENT / PLAN:  Chronic tracheostomy / vent dependence. LLL HCAP. COPD without acute exacerbation New collapse / effusion / ATX left - PNA -Full vent support -Daily SBT -Intermittent CXR  Septic shock - > resolved -Will not re-initiaite pressors  Septic shock - presumed due to HCAP.  Hx pseudomonal and coag neg staph bacteremia last admission in Feb Bacteremia klebsiella, pseudomonas, enterococcus MDRO Hx C.diff. PNA left withj collapse, pseudomonas Not receiving PO Vanc 2/2 PEG removal( ID aware) - ID following  Need to revisit GOC as she is now more awake and weaning.   Joneen Roach, AGACNP-BC [facility] [place] [facility]/Critical Care Pager [phone] or [phone]  12/10/2015 11:04 AM

## 2015-12-10 NOTE — Progress Notes (Signed)
[facility] TEAM 1 - Stepdown/ICU TEAM  Shilynn Hoch  YZK:[id number] DOB: [birth year] DOA: 12/04/2015 PCP: Hillary Bow, MD    Brief Narrative:  70 y.o. 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 She had a hospitalization to [facility] ICU 09/26/15 through 10/01/15 for aspiration PNA. During that admission she failed a swallow eval so her chronic G tube was exchanged for J tube by IR. Blood cultures that admission were noteable for coag neg staph and pseudomonas aeruginosa, sputum cultures were also noteable for pseudomonas (sputum resistant to both imipenem and zosyn; sputum and blood were both only sensitive to cipro). Due to her hx of C.diff, she was started on oral vanc per recs from ID.  Following her course of abx, she was discharged back to [facility].  On 05/04, she was brought to [facility] ED due to reports of AMS w/ minimal responsiveness x 24 hours. In the ED she was found to have septic shock presumed due to LLL HCAP as well as anemia with Hgb 6.1. She was started on levophed and 2u PRBC were ordered.  Significant Events: 5/4 Admitted with septic shock presumed due to HCAP as well as anemia (Hgb 6.1) 5/6 L lung collapse, peep, dnr 5/7 some improved aeration left  Assessment & Plan:  Chronic tracheostomy / vent dependence Care per PCCM   LLL HCAP w/ septic shock  Required levophed earlier in hospital stay - Hx pseudomonal and coag neg staph bacteremia Feb  Polymicrobial bacteremia with Enterococcus, CRE klebsiella, pseudomonas  abx per ID suggestions   LLL collapse  Care per PCCM - ongoing chest PT - increased PEEP   C diff colitis On flagyl per ID    COPD No evidence of acute exacerbation at this time   Anemia - acute on chronic Hgb presently stable/climbing    Recent Labs Lab 12/04/15 1928 12/05/15 0400 12/06/15 0533  HGB 6.1* 7.3* 8.7*   Hx DVT  Sacral decubitus ulcer stage  IV Care as per WOC Team  Altered mental status  likely due to sepsis and severe uremia   Hx anxiety / depression + chronic pain Appears quite anxious today - prn ativan   AKI - in setting of shock crt appears to be approaching normal   Recent Labs Lab 12/04/15 1928 12/05/15 0400 12/06/15 0533 12/09/15 2000  CREATININE 1.51* 1.66* 1.38* 1.04*    Severe malnutrition in context of chronic illness Presently has no means of ongoing nutritional support (J tube removed this hospitalization as it was not working) - will need to replace feeding tube if ongoing supportive care is to continue   DM CBG stable   MRSA screen +   DVT prophylaxis: SQ heparin  Code Status: NO CODE BLUE - DNR  Family Communication: no family present at time of exam  Disposition Plan: unclear presently - plan was to transition toward terminal wean, but pt more alert and conversant today - follow over next 24hrs    Consultants:  ID PCCM Palliative Care   Procedures:  CVL L IJ 5/04 >  Antimicrobials:  Vanc 05/04 > Zosyn 05/04 > Cipro 05/04 >  Subjective: The patient is much more alert today.  She can follow some simple commands.  She tells me she is very anxious.  She reports that she is dyspneic.  She cannot provide a more detailed history.  Objective: Blood pressure 139/82, pulse 99, temperature 97.5 F (36.4 C), temperature source  Oral, resp. rate 30, height 5' (1.524 m), weight 53.7 kg (118 lb 6.2 oz), SpO2 93 %.  Intake/Output Summary (Last 24 hours) at 12/10/15 1509 Last data filed at 12/10/15 0328  Gross per 24 hour  Intake   1225 ml  Output    350 ml  Net    875 ml   Filed Weights   12/09/15 0400 12/09/15 1524 12/10/15 0311  Weight: 50.2 kg (110 lb 10.7 oz) 49.8 kg (109 lb 12.6 oz) 53.7 kg (118 lb 6.2 oz)    Examination: General: No acute respiratory distress evident on vent - anxious - tachypneic  Lungs: poor air movement B bases  Cardiovascular: Tachycardic but  regular Abdomen: Nontender, soft, bowel sounds positive, no rebound, bilious fluid leaking from apparent prior PEG tube insertion site  Extremities: No significant cyanosis, clubbing, or edema bilateral lower extremities   CBC:  Recent Labs Lab 12/04/15 1928 12/05/15 0400 12/06/15 0533  WBC 39.4* 59.1* 35.1*  NEUTROABS 36.3*  --   --   HGB 6.1* 7.3* 8.7*  HCT 20.8* 23.6* 26.3*  MCV 87.4 89.7 86.8  PLT 729* 653* 673*   Basic Metabolic Panel:  Recent Labs Lab 12/04/15 1928 12/05/15 0400 12/06/15 0533 12/09/15 2000  NA 141 139 141 150*  K 5.1 5.3* 4.1 3.6  CL 89* 90* 95* 115*  CO2 38* 34* 33* 22  GLUCOSE 207* 254* 184* 123*  BUN 169* 164* 141* 63*  CREATININE 1.51* 1.66* 1.38* 1.04*  CALCIUM 9.1 8.1* 7.5* 7.6*  MG  --  2.3  --   --   PHOS  --  4.6  --   --    GFR: Estimated Creatinine Clearance: 36.2 mL/min (by C-G formula based on Cr of 1.04).   Liver Function Tests:  Recent Labs Lab 12/04/15 1928 12/06/15 0533  AST 26 21  ALT 14 15  ALKPHOS 240* 100  BILITOT 0.5 0.5  PROT 6.3* 5.5*  ALBUMIN 1.2* 1.1*   Cardiac Enzymes:  Recent Labs Lab 12/05/15 0400 12/05/15 1110  TROPONINI 0.27* 0.18*   CBG:  Recent Labs Lab 12/09/15 1959 12/10/15 0019 12/10/15 0337 12/10/15 0819 12/10/15 1249  GLUCAP 108* 106* 103* 75 99    Recent Results (from the past 240 hour(s))  Blood Culture (routine x 2)     Status: Abnormal   Collection Time: 12/04/15  7:28 PM  Result Value Ref Range Status   Specimen Description BLOOD PICC LINE  Final   Special Requests BOTTLES DRAWN AEROBIC AND ANAEROBIC 5CC  Final   Culture  Setup Time   Final    Organism ID to follow GRAM POSITIVE COCCI GRAM NEGATIVE RODS IN BOTH AEROBIC AND ANAEROBIC BOTTLES CRITICAL RESULT CALLED TO, READ BACK BY AND VERIFIED WITH: C. STEWART, PHARM D AT 0855 ON [id number] BY Rhea Bleacher    Culture (A)  Final    KLEBSIELLA PNEUMONIAE CARBAPENEMASE RESISTANT ENTEROBACTERIACAE PSEUDOMONAS  AERUGINOSA ENTEROCOCCUS SPECIES RESULT CALLED TO, READ BACK BY AND VERIFIED WITH: DR HATCHER 12/08/15 @ 86 M VESTAL NOTIFIED M DAVIS W/ INFECTION PREVENTION 12/08/15 @ 1100 M VESTAL    Report Status 12/10/2015 FINAL  Final   Organism ID, Bacteria KLEBSIELLA PNEUMONIAE  Final   Organism ID, Bacteria PSEUDOMONAS AERUGINOSA  Final   Organism ID, Bacteria ENTEROCOCCUS SPECIES  Final      Susceptibility   Klebsiella pneumoniae - MIC*    AMPICILLIN >=32 RESISTANT Resistant     CEFAZOLIN >=64 RESISTANT Resistant     CEFEPIME 16 RESISTANT  Resistant     CEFTAZIDIME >=64 RESISTANT Resistant     CEFTRIAXONE >=64 RESISTANT Resistant     CIPROFLOXACIN >=4 RESISTANT Resistant     GENTAMICIN >=16 RESISTANT Resistant     IMIPENEM 8 RESISTANT Resistant     TRIMETH/SULFA >=320 RESISTANT Resistant     AMPICILLIN/SULBACTAM >=32 RESISTANT Resistant     PIP/TAZO >=128 RESISTANT Resistant     * KLEBSIELLA PNEUMONIAE   Pseudomonas aeruginosa - MIC*    CEFTAZIDIME >=64 RESISTANT Resistant     CIPROFLOXACIN 2 INTERMEDIATE Intermediate     GENTAMICIN <=1 SENSITIVE Sensitive     IMIPENEM >=16 RESISTANT Resistant     CEFEPIME 32 RESISTANT Resistant     * PSEUDOMONAS AERUGINOSA   Enterococcus species - MIC*    AMPICILLIN 4 RESISTANT Resistant     VANCOMYCIN 4 SENSITIVE Sensitive     GENTAMICIN SYNERGY RESISTANT Resistant     * ENTEROCOCCUS SPECIES  Blood Culture (routine x 2)     Status: Abnormal   Collection Time: 12/04/15  7:28 PM  Result Value Ref Range Status   Specimen Description BLOOD PICC LINE  Final   Special Requests BOTTLES DRAWN AEROBIC AND ANAEROBIC 5CC  Final   Culture  Setup Time   Final    GRAM POSITIVE COCCI GRAM NEGATIVE RODS IN BOTH AEROBIC AND ANAEROBIC BOTTLES CRITICAL RESULT CALLED TO, READ BACK BY AND VERIFIED WITH: C. STEWART, PHARM D AT 0855 ON [id number] BY Lucienne Capers    Culture (A)  Final    KLEBSIELLA PNEUMONIAE PSEUDOMONAS AERUGINOSA ENTEROCOCCUS SPECIES SUSCEPTIBILITIES  PERFORMED ON PREVIOUS CULTURE WITHIN THE LAST 5 DAYS.    Report Status 12/10/2015 FINAL  Final  Blood Culture ID Panel (Reflexed)     Status: Abnormal   Collection Time: 12/04/15  7:28 PM  Result Value Ref Range Status   Enterococcus species DETECTED (A) NOT DETECTED Final    Comment: CRITICAL RESULT CALLED TO, READ BACK BY AND VERIFIED WITH: C. STEWART, PHARM D AT 0855 ON [id number] BY S. YARBROUGH    Vancomycin resistance NOT DETECTED NOT DETECTED Final   Listeria monocytogenes NOT DETECTED NOT DETECTED Final   Staphylococcus species NOT DETECTED NOT DETECTED Final   Staphylococcus aureus NOT DETECTED NOT DETECTED Final   Methicillin resistance NOT DETECTED NOT DETECTED Final   Streptococcus species NOT DETECTED NOT DETECTED Final   Streptococcus agalactiae NOT DETECTED NOT DETECTED Final   Streptococcus pneumoniae NOT DETECTED NOT DETECTED Final   Streptococcus pyogenes NOT DETECTED NOT DETECTED Final   Acinetobacter baumannii NOT DETECTED NOT DETECTED Final   Enterobacteriaceae species NOT DETECTED NOT DETECTED Final   Enterobacter cloacae complex NOT DETECTED NOT DETECTED Final   Escherichia coli NOT DETECTED NOT DETECTED Final   Klebsiella oxytoca NOT DETECTED NOT DETECTED Final   Klebsiella pneumoniae DETECTED (A) NOT DETECTED Final    Comment: CRITICAL RESULT CALLED TO, READ BACK BY AND VERIFIED WITH: C. STEWART,PHARM D AT 0855 ON [id number] BY S. YARBROUGH    Proteus species NOT DETECTED NOT DETECTED Final   Serratia marcescens NOT DETECTED NOT DETECTED Final   Carbapenem resistance DETECTED (A) NOT DETECTED Final    Comment: CRITICAL RESULT CALLED TO, READ BACK BY AND VERIFIED WITH: C. Delsa Sale D AT 0855 ON [phone] BY S. YARBROUGH AND NOTIFIED NATALIE DAVIS IN INFECTION CONTROL AT 0900 ON [id number] BY S. YARBROUGH    Haemophilus influenzae NOT DETECTED NOT DETECTED Final   Neisseria meningitidis NOT DETECTED NOT DETECTED Final   Pseudomonas aeruginosa DETECTED (A)  NOT DETECTED  Final    Comment: CRITICAL RESULT CALLED TO, READ BACK BY AND VERIFIED WITH: CLamont Snowball D AT 2536 ON [id number] BY S. YARBROUGH    Candida albicans NOT DETECTED NOT DETECTED Final   Candida glabrata NOT DETECTED NOT DETECTED Final   Candida krusei NOT DETECTED NOT DETECTED Final   Candida parapsilosis NOT DETECTED NOT DETECTED Final   Candida tropicalis NOT DETECTED NOT DETECTED Final  Urine culture     Status: None   Collection Time: 12/04/15  7:48 PM  Result Value Ref Range Status   Specimen Description URINE, CATHETERIZED  Final   Special Requests NONE  Final   Culture NO GROWTH 1 DAY  Final   Report Status 12/06/2015 FINAL  Final  MRSA PCR Screening     Status: Abnormal   Collection Time: 12/05/15 12:04 AM  Result Value Ref Range Status   MRSA by PCR POSITIVE (A) NEGATIVE Final    Comment:        The GeneXpert MRSA Assay (FDA approved for NASAL specimens only), is one component of a comprehensive MRSA colonization surveillance program. It is not intended to diagnose MRSA infection nor to guide or monitor treatment for MRSA infections. RESULT CALLED TO, READ BACK BY AND VERIFIED WITH: Filbert Berthold RN 10:40 12/05/15 ([email or web address])   Wound culture     Status: None   Collection Time: 12/05/15  7:30 AM  Result Value Ref Range Status   Specimen Description WOUND  Final   Special Requests SACRAL  Final   Gram Stain   Final    FEW WBC PRESENT, PREDOMINANTLY PMN RARE SQUAMOUS EPITHELIAL CELLS PRESENT ABUNDANT GRAM NEGATIVE RODS MODERATE GRAM POSITIVE COCCI IN PAIRS FEW GRAM POSITIVE RODS Performed at [facility]    Culture   Final    ABUNDANT PSEUDOMONAS AERUGINOSA DRUG RESISTANT Note: COLISTIN SENSITIVE 2 ug/mL ETEST results for this drug are "FOR INVESTIGATIONAL USE ONLY" and should NOT be used for clinical purposes. MUTLI CRITICAL RESULT CALLED TO, READ BACK BY AND VERIFIED WITH: CAROLYN (RN)@ 10:58AM ON  12/10/2015 HAJAM Performed at [facility]    Report  Status 12/10/2015 FINAL  Final   Organism ID, Bacteria PSEUDOMONAS AERUGINOSA  Final      Susceptibility   Pseudomonas aeruginosa - MIC*    CEFEPIME 32 RESISTANT Resistant     CEFTAZIDIME >=64 RESISTANT Resistant     CIPROFLOXACIN >=4 RESISTANT Resistant     GENTAMICIN <=1 SENSITIVE Sensitive     IMIPENEM >=16 RESISTANT Resistant     TOBRAMYCIN <=1 SENSITIVE Sensitive     * ABUNDANT PSEUDOMONAS AERUGINOSA  C difficile quick scan w PCR reflex     Status: Abnormal   Collection Time: 12/05/15  6:01 PM  Result Value Ref Range Status   C Diff antigen POSITIVE (A) NEGATIVE Final   C Diff toxin POSITIVE (A) NEGATIVE Final   C Diff interpretation Positive for toxigenic C. difficile  Final    Comment: CRITICAL RESULT CALLED TO, READ BACK BY AND VERIFIED WITH: Bryson Dames RN [id number] 12/05/15 A BROWNING   Culture, respiratory (tracheal aspirate)     Status: None   Collection Time: 12/05/15  8:53 PM  Result Value Ref Range Status   Specimen Description TRACHEAL ASPIRATE  Final   Special Requests NONE  Final   Gram Stain   Final    ABUNDANT WBC PRESENT, PREDOMINANTLY PMN FEW SQUAMOUS EPITHELIAL CELLS PRESENT ABUNDANT GRAM NEGATIVE RODS Performed at [facility]  Culture   Final    MODERATE PSEUDOMONAS AERUGINOSA Performed at [facility]    Report Status 12/08/2015 FINAL  Final   Organism ID, Bacteria PSEUDOMONAS AERUGINOSA  Final      Susceptibility   Pseudomonas aeruginosa - MIC*    CEFEPIME 16 INTERMEDIATE Intermediate     CEFTAZIDIME 32 RESISTANT Resistant     CIPROFLOXACIN 1 SENSITIVE Sensitive     GENTAMICIN <=1 SENSITIVE Sensitive     IMIPENEM >=16 RESISTANT Resistant     TOBRAMYCIN <=1 SENSITIVE Sensitive     * MODERATE PSEUDOMONAS AERUGINOSA  Culture, blood (Routine X 2) w Reflex to ID Panel     Status: None (Preliminary result)   Collection Time: 12/09/15 12:00 PM  Result Value Ref Range Status   Specimen Description BLOOD RIGHT ARM  Final   Special Requests  IN PEDIATRIC BOTTLE 3CC  Final   Culture  Setup Time   Final    GRAM POSITIVE COCCI IN CLUSTERS IN PEDIATRIC BOTTLE CRITICAL RESULT CALLED TO, READ BACK BY AND VERIFIED WITH: C [place] A T 0904 12/10/15 BY L BENFIELD    Culture NO GROWTH < 24 HOURS  Final   Report Status PENDING  Incomplete  Culture, blood (routine x 2)     Status: None (Preliminary result)   Collection Time: 12/09/15 12:20 PM  Result Value Ref Range Status   Specimen Description BLOOD RIGHT HAND  Final   Special Requests IN PEDIATRIC BOTTLE 3CC  Final   Culture NO GROWTH < 24 HOURS  Final   Report Status PENDING  Incomplete  Culture, blood (routine x 2)     Status: None (Preliminary result)   Collection Time: 12/09/15  3:45 PM  Result Value Ref Range Status   Specimen Description BLOOD LEFT ARM  Final   Special Requests IN PEDIATRIC BOTTLE 1.5CC  Final   Culture NO GROWTH < 24 HOURS  Final   Report Status PENDING  Incomplete      Scheduled Meds: . antiseptic oral rinse  7 mL Mouth Rinse QID  . ceftazidime avibactam (AVYCAZ) IVPB  1.25 g Intravenous Q8H  . chlorhexidine gluconate (SAGE KIT)  15 mL Mouth Rinse BID  . Chlorhexidine Gluconate Cloth  6 each Topical Q0600  . collagenase   Topical Daily  . gentamicin  100 mg Intravenous Q24H  . heparin  5,000 Units Subcutaneous Q8H  . insulin aspart  0-9 Units Subcutaneous Q4H  . metronidazole  500 mg Intravenous Q8H  . mupirocin ointment  1 application Nasal BID  . vancomycin  500 mg Oral Q6H  . vancomycin  500 mg Intravenous Q24H   Continuous Infusions: . sodium chloride 75 mL/hr at 12/09/15 1956     LOS: 6 days    Time spent: 25 minutes   Cherene Altes, MD [facility] Office  [phone] Pager - Text Page per Shea Evans as per below:  On-Call/Text Page:      [email or web address]      password [email or web address]  If 7PM-7AM, please contact night-coverage [email or web address] Password [email or web address] 12/10/2015, 3:09 PM

## 2015-12-10 NOTE — Progress Notes (Addendum)
Pharmacy Antibiotic Note  Kristen Arnold is a 70 y.o. female admitted on 12/04/2015 with bacteremia. Pt is on day #7 vancomycin, day #6 Avycaz, day #6 flagyl and day #3 gentamicin. She remains on flagyl while she is unable to take PO. Pt rec'd gent 7mg /kg but ~11-hour level was well above expected at 11.6; repeat random level this morning still remains elevated at 4.3 (goal trough < 2).  Renal function peaked at SCr 1.6 and is now trending now nicely with a sCr of 1 today, eCrCl 30-35 ml/min. Based on calculations, pt will not require another gentamicin dose until this evening. If vancomycin PO is started, order will need to be adjusted to ensure correct duration of therapy.    Plan: -Gentamicin 100 mg IV q24h   -Adjust Avycaz to 1.25 g IV q8h -Continue flagyl 500 mg IV q8h  -Continue vancomycin 500 mg IV q24h -Monitor renal fx, cultures, duration of therapy -Drug levels as needed    Height: 5' (152.4 cm) Weight: 118 lb 6.2 oz (53.7 kg) IBW/kg (Calculated) : 45.5  Temp (24hrs), Avg:97.7 F (36.5 C), Min:97.6 F (36.4 C), Max:97.8 F (36.6 C)   Recent Labs Lab 12/04/15 1928 12/04/15 2003 12/04/15 2230 12/05/15 0400 12/05/15 1110 12/06/15 0533 12/09/15 0255 12/09/15 2000 12/10/15 0316  WBC 39.4*  --   --  59.1*  --  35.1*  --   --   --   CREATININE 1.51*  --   --  1.66*  --  1.38*  --  1.04*  --   LATICACIDVEN  --  2.53* 3.4*  --  2.1*  --   --   --   --   VANCOTROUGH  --   --   --   --   --   --   --  20  --   GENTRANDOM  --   --   --   --   --   --  11.6  --  4.3    Estimated Creatinine Clearance: 36.2 mL/min (by C-G formula based on Cr of 1.04).    Allergies  Allergen Reactions  . Codeine     Unknown reaction, listed on MAR    Antimicrobials this admission: Zosyn 5/4 >> 5/5 Cipro 5/5 >> 5/5 Vancomycin 5/4 >> Avycaz 5/5 >> Gentamicin 5/8 >>  Dose adjustments this admission: 5/9 VT: 20 5/9 11-hr gent level: 11.6  5/10 gent random level: 4.3  Microbiology  results: 5/4 BCID >> KPC, Enterococcus, Pseudomonas 5/4 BCx x 2 >> Kleb pneumo (pan resistant), CRE, Pseudomonas (sensitive to gent) 5/4 Urine >> negative 5/5 MRSA PCR >> positive  5/5 C.diff >> positive 5/5 WCx >> PSA 5/5 RCx >> PSA  5/9 BCx:   Agapito GamesAlison Cheyrl Buley, PharmD, BCPS Clinical Pharmacist 12/10/2015 8:03 AM

## 2015-12-11 ENCOUNTER — Inpatient Hospital Stay (HOSPITAL_COMMUNITY): Payer: Medicare Other

## 2015-12-11 DIAGNOSIS — R1310 Dysphagia, unspecified: Secondary | ICD-10-CM | POA: Diagnosis present

## 2015-12-11 DIAGNOSIS — A4152 Sepsis due to Pseudomonas: Principal | ICD-10-CM

## 2015-12-11 DIAGNOSIS — R7881 Bacteremia: Secondary | ICD-10-CM

## 2015-12-11 LAB — GLUCOSE, CAPILLARY
GLUCOSE-CAPILLARY: 128 mg/dL — AB (ref 65–99)
GLUCOSE-CAPILLARY: 135 mg/dL — AB (ref 65–99)
GLUCOSE-CAPILLARY: 111 mg/dL — AB (ref 65–99)
Glucose-Capillary: 102 mg/dL — ABNORMAL HIGH (ref 65–99)
Glucose-Capillary: 135 mg/dL — ABNORMAL HIGH (ref 65–99)

## 2015-12-11 LAB — CBC
HCT: 28.7 % — ABNORMAL LOW (ref 36.0–46.0)
HEMOGLOBIN: 8.4 g/dL — AB (ref 12.0–15.0)
MCH: 26.5 pg (ref 26.0–34.0)
MCHC: 29.3 g/dL — AB (ref 30.0–36.0)
MCV: 90.5 fL (ref 78.0–100.0)
Platelets: 579 10*3/uL — ABNORMAL HIGH (ref 150–400)
RBC: 3.17 MIL/uL — AB (ref 3.87–5.11)
RDW: 19.7 % — ABNORMAL HIGH (ref 11.5–15.5)
WBC: 13.2 10*3/uL — ABNORMAL HIGH (ref 4.0–10.5)

## 2015-12-11 LAB — COMPREHENSIVE METABOLIC PANEL
ALBUMIN: 1.2 g/dL — AB (ref 3.5–5.0)
ALK PHOS: 78 U/L (ref 38–126)
ALT: 10 U/L — AB (ref 14–54)
AST: 11 U/L — ABNORMAL LOW (ref 15–41)
Anion gap: 12 (ref 5–15)
BUN: 45 mg/dL — ABNORMAL HIGH (ref 6–20)
CHLORIDE: 118 mmol/L — AB (ref 101–111)
CO2: 25 mmol/L (ref 22–32)
CREATININE: 1.05 mg/dL — AB (ref 0.44–1.00)
Calcium: 7.9 mg/dL — ABNORMAL LOW (ref 8.9–10.3)
GFR calc non Af Amer: 53 mL/min — ABNORMAL LOW (ref >60)
GLUCOSE: 132 mg/dL — AB (ref 65–99)
Potassium: 3.3 mmol/L — ABNORMAL LOW (ref 3.5–5.1)
SODIUM: 155 mmol/L — AB (ref 135–145)
Total Bilirubin: 0.8 mg/dL (ref 0.3–1.2)
Total Protein: 5.2 g/dL — ABNORMAL LOW (ref 6.5–8.1)

## 2015-12-11 LAB — ECHOCARDIOGRAM COMPLETE
Height: 60 in
Weight: 1887.14 oz

## 2015-12-11 LAB — MAGNESIUM: Magnesium: 1.4 mg/dL — ABNORMAL LOW (ref 1.7–2.4)

## 2015-12-11 MED ORDER — POTASSIUM CHLORIDE 2 MEQ/ML IV SOLN
INTRAVENOUS | Status: DC
  Administered 2015-12-11 – 2015-12-16 (×6): via INTRAVENOUS
  Filled 2015-12-11 (×11): qty 1000

## 2015-12-11 MED ORDER — SODIUM CHLORIDE 0.9% FLUSH
10.0000 mL | INTRAVENOUS | Status: DC | PRN
Start: 2015-12-11 — End: 2015-12-18

## 2015-12-11 MED ORDER — SODIUM CHLORIDE 0.9% FLUSH
10.0000 mL | Freq: Two times a day (BID) | INTRAVENOUS | Status: DC
  Administered 2015-12-11 – 2015-12-13 (×4): 10 mL
  Administered 2015-12-14: 20 mL
  Administered 2015-12-14 – 2015-12-15 (×2): 10 mL

## 2015-12-11 NOTE — Progress Notes (Signed)
INFECTIOUS DISEASE PROGRESS NOTE  ID: Kristen Arnold is a 70 y.o. female with  Active Problems:   Sepsis ([place])   Encounter for central line placement   Clostridium difficile colitis   CRKP (carbapenem-resistant Klebsiella pneumoniae) infection   Palliative care encounter   Acute respiratory failure ([place])   Bacteremia due to Enterococcus   Bacteremia due to Klebsiella pneumoniae   Bacteremia due to Pseudomonas   C. difficile colitis  Subjective: Awake and alert.  Does not answer questions.   Abtx:  Anti-infectives    Start     Dose/Rate Route Frequency Ordered Stop   12/10/15 2100  gentamicin (GARAMYCIN) IVPB 100 mg     100 mg 200 mL/hr over 30 Minutes Intravenous Every 24 hours 12/10/15 1056     12/10/15 1200  ceftazidime-avibactam (AVYCAZ) 1.25 g in dextrose 5 % 50 mL IVPB     1.25 g 25 mL/hr over 2 Hours Intravenous Every 8 hours 12/10/15 1057     12/09/15 2300  vancomycin (VANCOCIN) 500 mg in sodium chloride 0.9 % 100 mL IVPB     500 mg 100 mL/hr over 60 Minutes Intravenous Every 24 hours 12/09/15 2109     12/08/15 1600  gentamicin (GARAMYCIN) 360 mg in dextrose 5 % 100 mL IVPB     360 mg 109 mL/hr over 60 Minutes Intravenous  Once 12/08/15 1457 12/08/15 1716   12/06/15 2200  levofloxacin (LEVAQUIN) IVPB 500 mg  Status:  Discontinued     500 mg 100 mL/hr over 60 Minutes Intravenous Every 48 hours 12/04/15 2229 12/04/15 2229   12/06/15 1600  vancomycin (VANCOCIN) 50 mg/mL oral solution 500 mg     500 mg Oral Every 6 hours 12/06/15 1451 12/20/15 1159   12/06/15 1445  vancomycin (VANCOCIN) 50 mg/mL oral solution 500 mg  Status:  Discontinued     500 mg Oral Every 6 hours 12/06/15 1436 12/06/15 1451   12/05/15 2000  vancomycin (VANCOCIN) 500 mg in sodium chloride 0.9 % 100 mL IVPB  Status:  Discontinued     500 mg 100 mL/hr over 60 Minutes Intravenous Every 24 hours 12/04/15 2229 12/09/15 2109   12/05/15 1800  vancomycin (VANCOCIN) 50 mg/mL oral solution 500 mg  Status:   Discontinued     500 mg Oral Every 6 hours 12/05/15 1531 12/05/15 1606   12/05/15 1700  metroNIDAZOLE (FLAGYL) IVPB 500 mg     500 mg 100 mL/hr over 60 Minutes Intravenous Every 8 hours 12/05/15 1606     12/05/15 1100  ceftazidime-avibactam (AVYCAZ) 0.94 g in dextrose 5 % 50 mL IVPB  Status:  Discontinued     0.94 g 25 mL/hr over 2 Hours Intravenous Every 12 hours 12/05/15 1008 12/10/15 1057   12/05/15 1015  ceftazidime-avibactam (AVYCAZ) 0.94 g in dextrose 5 % 50 mL IVPB  Status:  Discontinued     0.94 g 25 mL/hr over 2 Hours Intravenous Every 12 hours 12/05/15 1008 12/05/15 1008   12/05/15 0400  piperacillin-tazobactam (ZOSYN) IVPB 3.375 g  Status:  Discontinued     3.375 g 12.5 mL/hr over 240 Minutes Intravenous Every 8 hours 12/04/15 2229 12/05/15 1006   12/04/15 2300  ciprofloxacin (CIPRO) IVPB 200 mg  Status:  Discontinued     200 mg 100 mL/hr over 60 Minutes Intravenous Every 24 hours 12/04/15 2235 12/05/15 1006   12/04/15 2230  levofloxacin (LEVAQUIN) IVPB 750 mg  Status:  Discontinued     750 mg 100 mL/hr over 90 Minutes Intravenous  Once 12/04/15 2229 12/04/15 2229   12/04/15 1930  vancomycin (VANCOCIN) IVPB 1000 mg/200 mL premix     1,000 mg 200 mL/hr over 60 Minutes Intravenous  Once 12/04/15 1924 12/04/15 2041   12/04/15 1930  piperacillin-tazobactam (ZOSYN) IVPB 3.375 g     3.375 g 100 mL/hr over 30 Minutes Intravenous  Once 12/04/15 1924 12/04/15 1958      Medications:  Scheduled: . antiseptic oral rinse  7 mL Mouth Rinse QID  . ceftazidime avibactam (AVYCAZ) IVPB  1.25 g Intravenous Q8H  . chlorhexidine gluconate (SAGE KIT)  15 mL Mouth Rinse BID  . collagenase   Topical Daily  . famotidine (PEPCID) IV  20 mg Intravenous Q12H  . gentamicin  100 mg Intravenous Q24H  . heparin  5,000 Units Subcutaneous Q8H  . insulin aspart  0-9 Units Subcutaneous Q4H  . metronidazole  500 mg Intravenous Q8H  . mupirocin ointment  1 application Nasal BID  . vancomycin  500 mg  Oral Q6H  . vancomycin  500 mg Intravenous Q24H    Objective: Vital signs in last 24 hours: Temp:  [97.5 F (36.4 C)-97.8 F (36.6 C)] 97.8 F (36.6 C) (05/11 0800) Pulse Rate:  [77-100] 87 (05/11 0957) Resp:  [14-31] 18 (05/11 0957) BP: (107-139)/(55-82) 114/62 mmHg (05/11 0957) SpO2:  [93 %-100 %] 94 % (05/11 0957) FiO2 (%):  [40 %] 40 % (05/11 0957) Weight:  [53.5 kg (117 lb 15.1 oz)] 53.5 kg (117 lb 15.1 oz) (05/11 0341)   General appearance: alert and no distress Resp: clear to auscultation bilaterally and on TC Cardio: regular rate and rhythm GI: abnormal findings:  distended and hypoactive bowel sounds  Lab Results  Recent Labs  12/09/15 2000 12/11/15 0453  WBC  --  13.2*  HGB  --  8.4*  HCT  --  28.7*  NA 150* 155*  K 3.6 3.3*  CL 115* 118*  CO2 22 25  BUN 63* 45*  CREATININE 1.04* 1.05*   Liver Panel  Recent Labs  12/11/15 0453  PROT 5.2*  ALBUMIN 1.2*  AST 11*  ALT 10*  ALKPHOS 78  BILITOT 0.8   Sedimentation Rate No results for input(s): ESRSEDRATE in the last 72 hours. C-Reactive Protein No results for input(s): CRP in the last 72 hours.  Microbiology: Recent Results (from the past 240 hour(s))  Blood Culture (routine x 2)     Status: Abnormal   Collection Time: 12/04/15  7:28 PM  Result Value Ref Range Status   Specimen Description BLOOD PICC LINE  Final   Special Requests BOTTLES DRAWN AEROBIC AND ANAEROBIC 5CC  Final   Culture  Setup Time   Final    Organism ID to follow GRAM POSITIVE COCCI GRAM NEGATIVE RODS IN BOTH AEROBIC AND ANAEROBIC BOTTLES CRITICAL RESULT CALLED TO, READ BACK BY AND VERIFIED WITH: C. STEWART, PHARM D AT 0855 ON [id number] BY Rhea Bleacher    Culture (A)  Final    KLEBSIELLA PNEUMONIAE CARBAPENEMASE RESISTANT ENTEROBACTERIACAE PSEUDOMONAS AERUGINOSA ENTEROCOCCUS SPECIES RESULT CALLED TO, READ BACK BY AND VERIFIED WITH: DR Seara Hinesley 12/08/15 @ 41 M VESTAL NOTIFIED M DAVIS W/ INFECTION PREVENTION 12/08/15 @ 55 M  VESTAL    Report Status 12/10/2015 FINAL  Final   Organism ID, Bacteria KLEBSIELLA PNEUMONIAE  Final   Organism ID, Bacteria PSEUDOMONAS AERUGINOSA  Final   Organism ID, Bacteria ENTEROCOCCUS SPECIES  Final      Susceptibility   Klebsiella pneumoniae - MIC*    AMPICILLIN >=32  RESISTANT Resistant     CEFAZOLIN >=64 RESISTANT Resistant     CEFEPIME 16 RESISTANT Resistant     CEFTAZIDIME >=64 RESISTANT Resistant     CEFTRIAXONE >=64 RESISTANT Resistant     CIPROFLOXACIN >=4 RESISTANT Resistant     GENTAMICIN >=16 RESISTANT Resistant     IMIPENEM 8 RESISTANT Resistant     TRIMETH/SULFA >=320 RESISTANT Resistant     AMPICILLIN/SULBACTAM >=32 RESISTANT Resistant     PIP/TAZO >=128 RESISTANT Resistant     * KLEBSIELLA PNEUMONIAE   Pseudomonas aeruginosa - MIC*    CEFTAZIDIME >=64 RESISTANT Resistant     CIPROFLOXACIN 2 INTERMEDIATE Intermediate     GENTAMICIN <=1 SENSITIVE Sensitive     IMIPENEM >=16 RESISTANT Resistant     CEFEPIME 32 RESISTANT Resistant     * PSEUDOMONAS AERUGINOSA   Enterococcus species - MIC*    AMPICILLIN 4 RESISTANT Resistant     VANCOMYCIN 4 SENSITIVE Sensitive     GENTAMICIN SYNERGY RESISTANT Resistant     * ENTEROCOCCUS SPECIES  Blood Culture (routine x 2)     Status: Abnormal   Collection Time: 12/04/15  7:28 PM  Result Value Ref Range Status   Specimen Description BLOOD PICC LINE  Final   Special Requests BOTTLES DRAWN AEROBIC AND ANAEROBIC 5CC  Final   Culture  Setup Time   Final    GRAM POSITIVE COCCI GRAM NEGATIVE RODS IN BOTH AEROBIC AND ANAEROBIC BOTTLES CRITICAL RESULT CALLED TO, READ BACK BY AND VERIFIED WITH: C. STEWART, PHARM D AT 0855 ON [id number] BY Rhea Bleacher    Culture (A)  Final    KLEBSIELLA PNEUMONIAE PSEUDOMONAS AERUGINOSA ENTEROCOCCUS SPECIES SUSCEPTIBILITIES PERFORMED ON PREVIOUS CULTURE WITHIN THE LAST 5 DAYS.    Report Status 12/10/2015 FINAL  Final  Blood Culture ID Panel (Reflexed)     Status: Abnormal   Collection Time:  12/04/15  7:28 PM  Result Value Ref Range Status   Enterococcus species DETECTED (A) NOT DETECTED Final    Comment: CRITICAL RESULT CALLED TO, READ BACK BY AND VERIFIED WITH: C. STEWART, PHARM D AT 0855 ON [id number] BY S. YARBROUGH    Vancomycin resistance NOT DETECTED NOT DETECTED Final   Listeria monocytogenes NOT DETECTED NOT DETECTED Final   Staphylococcus species NOT DETECTED NOT DETECTED Final   Staphylococcus aureus NOT DETECTED NOT DETECTED Final   Methicillin resistance NOT DETECTED NOT DETECTED Final   Streptococcus species NOT DETECTED NOT DETECTED Final   Streptococcus agalactiae NOT DETECTED NOT DETECTED Final   Streptococcus pneumoniae NOT DETECTED NOT DETECTED Final   Streptococcus pyogenes NOT DETECTED NOT DETECTED Final   Acinetobacter baumannii NOT DETECTED NOT DETECTED Final   Enterobacteriaceae species NOT DETECTED NOT DETECTED Final   Enterobacter cloacae complex NOT DETECTED NOT DETECTED Final   Escherichia coli NOT DETECTED NOT DETECTED Final   Klebsiella oxytoca NOT DETECTED NOT DETECTED Final   Klebsiella pneumoniae DETECTED (A) NOT DETECTED Final    Comment: CRITICAL RESULT CALLED TO, READ BACK BY AND VERIFIED WITH: C. STEWART,PHARM D AT 0855 ON [id number] BY S. YARBROUGH    Proteus species NOT DETECTED NOT DETECTED Final   Serratia marcescens NOT DETECTED NOT DETECTED Final   Carbapenem resistance DETECTED (A) NOT DETECTED Final    Comment: CRITICAL RESULT CALLED TO, READ BACK BY AND VERIFIED WITH: CLamont Snowball D AT 0855 ON [id number] BY Rhea Bleacher AND NOTIFIED NATALIE DAVIS IN INFECTION CONTROL AT 0900 ON [id number] BY S. YARBROUGH    Haemophilus influenzae NOT DETECTED NOT  DETECTED Final   Neisseria meningitidis NOT DETECTED NOT DETECTED Final   Pseudomonas aeruginosa DETECTED (A) NOT DETECTED Final    Comment: CRITICAL RESULT CALLED TO, READ BACK BY AND VERIFIED WITH: CLamont Snowball D AT 2863 ON [id number] BY S. YARBROUGH    Candida albicans NOT DETECTED NOT  DETECTED Final   Candida glabrata NOT DETECTED NOT DETECTED Final   Candida krusei NOT DETECTED NOT DETECTED Final   Candida parapsilosis NOT DETECTED NOT DETECTED Final   Candida tropicalis NOT DETECTED NOT DETECTED Final  Urine culture     Status: None   Collection Time: 12/04/15  7:48 PM  Result Value Ref Range Status   Specimen Description URINE, CATHETERIZED  Final   Special Requests NONE  Final   Culture NO GROWTH 1 DAY  Final   Report Status 12/06/2015 FINAL  Final  MRSA PCR Screening     Status: Abnormal   Collection Time: 12/05/15 12:04 AM  Result Value Ref Range Status   MRSA by PCR POSITIVE (A) NEGATIVE Final    Comment:        The GeneXpert MRSA Assay (FDA approved for NASAL specimens only), is one component of a comprehensive MRSA colonization surveillance program. It is not intended to diagnose MRSA infection nor to guide or monitor treatment for MRSA infections. RESULT CALLED TO, READ BACK BY AND VERIFIED WITH: Filbert Berthold RN 10:40 12/05/15 ([email or web address])   Wound culture     Status: None   Collection Time: 12/05/15  7:30 AM  Result Value Ref Range Status   Specimen Description WOUND  Final   Special Requests SACRAL  Final   Gram Stain   Final    FEW WBC PRESENT, PREDOMINANTLY PMN RARE SQUAMOUS EPITHELIAL CELLS PRESENT ABUNDANT GRAM NEGATIVE RODS MODERATE GRAM POSITIVE COCCI IN PAIRS FEW GRAM POSITIVE RODS Performed at [facility]    Culture   Final    ABUNDANT PSEUDOMONAS AERUGINOSA DRUG RESISTANT Note: COLISTIN SENSITIVE 2 ug/mL ETEST results for this drug are "FOR INVESTIGATIONAL USE ONLY" and should NOT be used for clinical purposes. MUTLI CRITICAL RESULT CALLED TO, READ BACK BY AND VERIFIED WITH: CAROLYN (RN)@ 10:58AM ON  12/10/2015 HAJAM Performed at [facility]    Report Status 12/10/2015 FINAL  Final   Organism ID, Bacteria PSEUDOMONAS AERUGINOSA  Final      Susceptibility   Pseudomonas aeruginosa - MIC*    CEFEPIME 32 RESISTANT  Resistant     CEFTAZIDIME >=64 RESISTANT Resistant     CIPROFLOXACIN >=4 RESISTANT Resistant     GENTAMICIN <=1 SENSITIVE Sensitive     IMIPENEM >=16 RESISTANT Resistant     TOBRAMYCIN <=1 SENSITIVE Sensitive     * ABUNDANT PSEUDOMONAS AERUGINOSA  C difficile quick scan w PCR reflex     Status: Abnormal   Collection Time: 12/05/15  6:01 PM  Result Value Ref Range Status   C Diff antigen POSITIVE (A) NEGATIVE Final   C Diff toxin POSITIVE (A) NEGATIVE Final   C Diff interpretation Positive for toxigenic C. difficile  Final    Comment: CRITICAL RESULT CALLED TO, READ BACK BY AND VERIFIED WITHBryson Dames RN [id number] 12/05/15 A BROWNING   Culture, respiratory (tracheal aspirate)     Status: None   Collection Time: 12/05/15  8:53 PM  Result Value Ref Range Status   Specimen Description TRACHEAL ASPIRATE  Final   Special Requests NONE  Final   Gram Stain   Final    ABUNDANT WBC PRESENT, PREDOMINANTLY PMN  FEW SQUAMOUS EPITHELIAL CELLS PRESENT ABUNDANT GRAM NEGATIVE RODS Performed at [facility]    Culture   Final    MODERATE PSEUDOMONAS AERUGINOSA Performed at [facility]    Report Status 12/08/2015 FINAL  Final   Organism ID, Bacteria PSEUDOMONAS AERUGINOSA  Final      Susceptibility   Pseudomonas aeruginosa - MIC*    CEFEPIME 16 INTERMEDIATE Intermediate     CEFTAZIDIME 32 RESISTANT Resistant     CIPROFLOXACIN 1 SENSITIVE Sensitive     GENTAMICIN <=1 SENSITIVE Sensitive     IMIPENEM >=16 RESISTANT Resistant     TOBRAMYCIN <=1 SENSITIVE Sensitive     * MODERATE PSEUDOMONAS AERUGINOSA  Culture, blood (Routine X 2) w Reflex to ID Panel     Status: None (Preliminary result)   Collection Time: 12/09/15 12:00 PM  Result Value Ref Range Status   Specimen Description BLOOD RIGHT ARM  Final   Special Requests IN PEDIATRIC BOTTLE 3CC  Final   Culture  Setup Time   Final    GRAM POSITIVE COCCI IN CLUSTERS IN PEDIATRIC BOTTLE CRITICAL RESULT CALLED TO, READ BACK BY AND  VERIFIED WITH: C WHITE,RN A T 0904 12/10/15 BY L BENFIELD    Culture NO GROWTH < 24 HOURS  Final   Report Status PENDING  Incomplete  Culture, blood (routine x 2)     Status: None (Preliminary result)   Collection Time: 12/09/15 12:20 PM  Result Value Ref Range Status   Specimen Description BLOOD RIGHT HAND  Final   Special Requests IN PEDIATRIC BOTTLE 3CC  Final   Culture NO GROWTH < 24 HOURS  Final   Report Status PENDING  Incomplete  Culture, blood (routine x 2)     Status: None (Preliminary result)   Collection Time: 12/09/15  3:45 PM  Result Value Ref Range Status   Specimen Description BLOOD LEFT ARM  Final   Special Requests IN PEDIATRIC BOTTLE 1.5CC  Final   Culture NO GROWTH < 24 HOURS  Final   Report Status PENDING  Incomplete    Studies/Results: No results found.   Assessment/Plan: Polymicrobial BCx- pseudomonas, klebsiella (KPC), enterococcus (non-VRE).  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  Cr improved on gent Await sensi to avycaz (will proabably take more than 14 days to result) Would aim for 14 days of avycaz, gent 14 days of flagyl- then onto prolonged po vanco taper (assuming she has capability to take po by then) await BCx ID prior to making rec about vanco tx Address PEG tube status Needs palliative care eval, family input         Bobby Rumpf Infectious Diseases (pager) [phone] [email or web address] 12/11/2015, 11:06 AM  LOS: 7 days

## 2015-12-11 NOTE — Progress Notes (Signed)
[place] TEAM 1 - Stepdown/ICU TEAM Progress Note  Kristen Arnold WFU:[id number] DOB: [birth year] DOA: 12/04/2015 PCP: Verneita Griffes, MD  Admit HPI / Brief Narrative: 70 y.o. female WF PMHx Tracheostomy after failure to wean from vent following right hemicolectomy for bowel obstruction in December 2016. She had a hospitalization to [facility] ICU 09/26/15 through 10/01/15 for aspiration PNA. During that admission she failed a swallow eval so her chronic G tube was exchanged for J tube by IR. Blood cultures that admission were noteable for coag neg staph and pseudomonas aeruginosa, sputum cultures were also noteable for pseudomonas (sputum resistant to both imipenem and zosyn; sputum and blood were both only sensitive to cipro). Due to her hx of C.diff, she was started on oral vanc per recs from ID. Following her course of abx, she was discharged back to [facility].  On 05/04, she was brought to [facility] ED due to reports of AMS w/ minimal responsiveness x 24 hours. In the ED she was found to have septic shock presumed due to LLL HCAP as well as anemia with Hgb 6.1. She was started on levophed and 2u PRBC were ordered.  HPI/Subjective: 5/11 alert with eyes open. Follows some commands.  Assessment/Plan: Chronic tracheostomy / vent dependence -Care per PCCM  -Spoke with Dr. Rhea Pink Palliative Care after completing conversation initially agreed that sons wanted patient to be on terminal wean/comfort care. Relayed this information to [place] PCCM who examined patient and also found patient alert, and therefore was hasn't been to perform terminal wean. Was going to speak with Dr. Hilma Favors and then we would determine plan of care. Unfortunately was unable to touch base with either Dr. later in the day. Would maintain patient as DO NOT RESUSCITATE. -Left message with Dr. Hilma Favors palliative care to discuss goals of care.   LLL HCAP w/ septic shock  -Required levophed earlier in hospital stay -Off  pressors - Hx pseudomonal and coag neg staph bacteremia Feb  Polymicrobial bacteremia with Enterococcus, CRE klebsiella, pseudomonas  -Abx per ID    LLL collapse  -Care per PCCM  -ongoing chest PT   C diff colitis -On Vanc per ID   COPD -No evidence of acute exacerbation at this time   Anemia - acute on chronic Hgb presently stable   Hx DVT  Sacral decubitus ulcer stage IV Care as per WOC Team  Altered mental status  -likely due to sepsis and severe uremia, clearing.   Hx anxiety / depression + chronic pain  AKI - in setting of shock -Cr slowly improving  Nutrition -Placed or for CorTrack placement and nutrition consult; addendum unsuccessful attempt to place CorTrack. Have requested IR place NG tube.  Hypernatremia -D5W+ KCl 40 mEq at 100m/hr  Hypokalemia -See hypernatremia   DM Type 2  -Sensitive SSI    Goals of care -Per Palliative Care note 5/8 Son JCorene Corneaendorses full comfort care. I explained that the medical team felt it would be appropriate to allow for a natural death to occur by discontinuing mechanical ventilation and keeping her comfortable with sedation and pain medication. He agrees that this is what he would want and the wants her suffering to end -See chronic tracheostomy   Code Status: FULL Family Communication: no family present at time of exam Disposition Plan: Comfort care???    Consultants: ID PCCM Palliative Care   Procedure/Significant Events: 5/4 Admitted with septic shock presumed due to HCAP as well as anemia (Hgb 6.1) 5/6 Lt lung collapse, peep, dnr  5/7 some improved aeration left   Culture   Antibiotics: Vanc 05/04 > Zosyn 05/04 > Cipro 05/04 >   DVT prophylaxis: Subcutaneous heparin   Devices    LINES / TUBES:  CVL L IJ 5/04 >    Continuous Infusions: . dextrose 5 % with kcl 75 mL/hr at 12/11/15 1125    Objective: VITAL SIGNS: Temp: 97.8 F (36.6 C) (05/11 0800) Temp Source: Oral (05/11  0800) BP: 143/74 mmHg (05/11 1133) Pulse Rate: 102 (05/11 1133) SPO2; FIO2:   Intake/Output Summary (Last 24 hours) at 12/11/15 1200 Last data filed at 12/11/15 0400  Gross per 24 hour  Intake   1150 ml  Output   1250 ml  Net   -100 ml     Exam: General: Patient opens Spontaneously,Acute respiratory distress (improving able to tolerate trach collar~90 minutes.) Eyes: negative scleral hemorrhage, negative icterus ENT: Negative Runny nose, negative gingival bleeding, Neck:  Negative scars, masses, torticollis, lymphadenopathy, JVD, trachea tracheostomy in place, negative bleeding or signs of infection. Lungs: diffuse rhonchi, with thick yellow/white sputum without wheezes or crackles Cardiovascular: Regular rate and rhythm without murmur gallop or rub normal S1 and S2 Abdomen: Ostomy in place draining brownish fluid, negative abdominal pain to palpation (although patient points to abdomen when asked about pain) , nondistended, negative bowel sounds, no rebound, no ascites, no appreciable mass Extremities: No significant cyanosis, clubbing, or edema bilateral lower extremities Psychiatric:  Unable to assess secondary to tracheostomy  Neurologic:  Follows commands, moves all extremities to command.   Data Reviewed: Basic Metabolic Panel:  Recent Labs Lab 12/04/15 1928 12/05/15 0400 12/06/15 0533 12/09/15 2000 12/11/15 0453  NA 141 139 141 150* 155*  K 5.1 5.3* 4.1 3.6 3.3*  CL 89* 90* 95* 115* 118*  CO2 38* 34* 33* 22 25  GLUCOSE 207* 254* 184* 123* 132*  BUN 169* 164* 141* 63* 45*  CREATININE 1.51* 1.66* 1.38* 1.04* 1.05*  CALCIUM 9.1 8.1* 7.5* 7.6* 7.9*  MG  --  2.3  --   --   --   PHOS  --  4.6  --   --   --    Liver Function Tests:  Recent Labs Lab 12/04/15 1928 12/06/15 0533 12/11/15 0453  AST 26 21 11*  ALT 14 15 10*  ALKPHOS 240* 100 78  BILITOT 0.5 0.5 0.8  PROT 6.3* 5.5* 5.2*  ALBUMIN 1.2* 1.1* 1.2*   No results for input(s): LIPASE, AMYLASE in the  last 168 hours. No results for input(s): AMMONIA in the last 168 hours. CBC:  Recent Labs Lab 12/04/15 1928 12/05/15 0400 12/06/15 0533 12/11/15 0453  WBC 39.4* 59.1* 35.1* 13.2*  NEUTROABS 36.3*  --   --   --   HGB 6.1* 7.3* 8.7* 8.4*  HCT 20.8* 23.6* 26.3* 28.7*  MCV 87.4 89.7 86.8 90.5  PLT 729* 653* 596* 579*   Cardiac Enzymes:  Recent Labs Lab 12/05/15 0400 12/05/15 1110  TROPONINI 0.27* 0.18*   BNP (last 3 results) No results for input(s): BNP in the last 8760 hours.  ProBNP (last 3 results) No results for input(s): PROBNP in the last 8760 hours.  CBG:  Recent Labs Lab 12/10/15 1700 12/10/15 1946 12/10/15 2259 12/11/15 0351 12/11/15 0853  GLUCAP 117* 119* 96 102* 135*    Recent Results (from the past 240 hour(s))  Blood Culture (routine x 2)     Status: Abnormal   Collection Time: 12/04/15  7:28 PM  Result Value Ref Range Status  Specimen Description BLOOD PICC LINE  Final   Special Requests BOTTLES DRAWN AEROBIC AND ANAEROBIC 5CC  Final   Culture  Setup Time   Final    Organism ID to follow GRAM POSITIVE COCCI GRAM NEGATIVE RODS IN BOTH AEROBIC AND ANAEROBIC BOTTLES CRITICAL RESULT CALLED TO, READ BACK BY AND VERIFIED WITH: C. STEWART, PHARM D AT [place] ON [id number] BY S. YARBROUGH    Culture (A)  Final    KLEBSIELLA PNEUMONIAE CARBAPENEMASE RESISTANT ENTEROBACTERIACAE PSEUDOMONAS AERUGINOSA ENTEROCOCCUS SPECIES RESULT CALLED TO, READ BACK BY AND VERIFIED WITH: DR HATCHER 12/08/15 @ 34 M VESTAL NOTIFIED M DAVIS W/ INFECTION PREVENTION 12/08/15 @ 1100 M VESTAL    Report Status 12/10/2015 FINAL  Final   Organism ID, Bacteria KLEBSIELLA PNEUMONIAE  Final   Organism ID, Bacteria PSEUDOMONAS AERUGINOSA  Final   Organism ID, Bacteria ENTEROCOCCUS SPECIES  Final      Susceptibility   Klebsiella pneumoniae - MIC*    AMPICILLIN >=32 RESISTANT Resistant     CEFAZOLIN >=64 RESISTANT Resistant     CEFEPIME 16 RESISTANT Resistant     CEFTAZIDIME >=64  RESISTANT Resistant     CEFTRIAXONE >=64 RESISTANT Resistant     CIPROFLOXACIN >=4 RESISTANT Resistant     GENTAMICIN >=16 RESISTANT Resistant     IMIPENEM 8 RESISTANT Resistant     TRIMETH/SULFA >=320 RESISTANT Resistant     AMPICILLIN/SULBACTAM >=32 RESISTANT Resistant     PIP/TAZO >=128 RESISTANT Resistant     * KLEBSIELLA PNEUMONIAE   Pseudomonas aeruginosa - MIC*    CEFTAZIDIME >=64 RESISTANT Resistant     CIPROFLOXACIN 2 INTERMEDIATE Intermediate     GENTAMICIN <=1 SENSITIVE Sensitive     IMIPENEM >=16 RESISTANT Resistant     CEFEPIME 32 RESISTANT Resistant     * PSEUDOMONAS AERUGINOSA   Enterococcus species - MIC*    AMPICILLIN 4 RESISTANT Resistant     VANCOMYCIN 4 SENSITIVE Sensitive     GENTAMICIN SYNERGY RESISTANT Resistant     * ENTEROCOCCUS SPECIES  Blood Culture (routine x 2)     Status: Abnormal   Collection Time: 12/04/15  7:28 PM  Result Value Ref Range Status   Specimen Description BLOOD PICC LINE  Final   Special Requests BOTTLES DRAWN AEROBIC AND ANAEROBIC 5CC  Final   Culture  Setup Time   Final    GRAM POSITIVE COCCI GRAM NEGATIVE RODS IN BOTH AEROBIC AND ANAEROBIC BOTTLES CRITICAL RESULT CALLED TO, READ BACK BY AND VERIFIED WITH: C. STEWART, PHARM D AT 0855 ON [id number] BY Rhea Bleacher    Culture (A)  Final    KLEBSIELLA PNEUMONIAE PSEUDOMONAS AERUGINOSA ENTEROCOCCUS SPECIES SUSCEPTIBILITIES PERFORMED ON PREVIOUS CULTURE WITHIN THE LAST 5 DAYS.    Report Status 12/10/2015 FINAL  Final  Blood Culture ID Panel (Reflexed)     Status: Abnormal   Collection Time: 12/04/15  7:28 PM  Result Value Ref Range Status   Enterococcus species DETECTED (A) NOT DETECTED Final    Comment: CRITICAL RESULT CALLED TO, READ BACK BY AND VERIFIED WITH: C. STEWART, PHARM D AT 0855 ON [id number] BY S. YARBROUGH    Vancomycin resistance NOT DETECTED NOT DETECTED Final   Listeria monocytogenes NOT DETECTED NOT DETECTED Final   Staphylococcus species NOT DETECTED NOT DETECTED Final    Staphylococcus aureus NOT DETECTED NOT DETECTED Final   Methicillin resistance NOT DETECTED NOT DETECTED Final   Streptococcus species NOT DETECTED NOT DETECTED Final   Streptococcus agalactiae NOT DETECTED NOT DETECTED Final   Streptococcus  pneumoniae NOT DETECTED NOT DETECTED Final   Streptococcus pyogenes NOT DETECTED NOT DETECTED Final   Acinetobacter baumannii NOT DETECTED NOT DETECTED Final   Enterobacteriaceae species NOT DETECTED NOT DETECTED Final   Enterobacter cloacae complex NOT DETECTED NOT DETECTED Final   Escherichia coli NOT DETECTED NOT DETECTED Final   Klebsiella oxytoca NOT DETECTED NOT DETECTED Final   Klebsiella pneumoniae DETECTED (A) NOT DETECTED Final    Comment: CRITICAL RESULT CALLED TO, READ BACK BY AND VERIFIED WITH: C. STEWART,PHARM D AT 0855 ON [id number] BY S. YARBROUGH    Proteus species NOT DETECTED NOT DETECTED Final   Serratia marcescens NOT DETECTED NOT DETECTED Final   Carbapenem resistance DETECTED (A) NOT DETECTED Final    Comment: CRITICAL RESULT CALLED TO, READ BACK BY AND VERIFIED WITH: C. Lamont Snowball D AT 0855 ON [id number] BY S. YARBROUGH AND NOTIFIED NATALIE DAVIS IN INFECTION CONTROL AT 0900 ON [id number] BY S. YARBROUGH    Haemophilus influenzae NOT DETECTED NOT DETECTED Final   Neisseria meningitidis NOT DETECTED NOT DETECTED Final   Pseudomonas aeruginosa DETECTED (A) NOT DETECTED Final    Comment: CRITICAL RESULT CALLED TO, READ BACK BY AND VERIFIED WITH: CLamont Snowball D AT 0855 ON [id number] BY S. YARBROUGH    Candida albicans NOT DETECTED NOT DETECTED Final   Candida glabrata NOT DETECTED NOT DETECTED Final   Candida krusei NOT DETECTED NOT DETECTED Final   Candida parapsilosis NOT DETECTED NOT DETECTED Final   Candida tropicalis NOT DETECTED NOT DETECTED Final  Urine culture     Status: None   Collection Time: 12/04/15  7:48 PM  Result Value Ref Range Status   Specimen Description URINE, CATHETERIZED  Final   Special Requests NONE   Final   Culture NO GROWTH 1 DAY  Final   Report Status 12/06/2015 FINAL  Final  MRSA PCR Screening     Status: Abnormal   Collection Time: 12/05/15 12:04 AM  Result Value Ref Range Status   MRSA by PCR POSITIVE (A) NEGATIVE Final    Comment:        The GeneXpert MRSA Assay (FDA approved for NASAL specimens only), is one component of a comprehensive MRSA colonization surveillance program. It is not intended to diagnose MRSA infection nor to guide or monitor treatment for MRSA infections. RESULT CALLED TO, READ BACK BY AND VERIFIED WITH: Filbert Berthold RN 10:40 12/05/15 ([email or web address])   Wound culture     Status: None   Collection Time: 12/05/15  7:30 AM  Result Value Ref Range Status   Specimen Description WOUND  Final   Special Requests SACRAL  Final   Gram Stain   Final    FEW WBC PRESENT, PREDOMINANTLY PMN RARE SQUAMOUS EPITHELIAL CELLS PRESENT ABUNDANT GRAM NEGATIVE RODS MODERATE GRAM POSITIVE COCCI IN PAIRS FEW GRAM POSITIVE RODS Performed at [facility]    Culture   Final    ABUNDANT PSEUDOMONAS AERUGINOSA DRUG RESISTANT Note: COLISTIN SENSITIVE 2 ug/mL ETEST results for this drug are "FOR INVESTIGATIONAL USE ONLY" and should NOT be used for clinical purposes. MUTLI CRITICAL RESULT CALLED TO, READ BACK BY AND VERIFIED WITH: CAROLYN (RN)@ 10:58AM ON  12/10/2015 HAJAM Performed at [facility]    Report Status 12/10/2015 FINAL  Final   Organism ID, Bacteria PSEUDOMONAS AERUGINOSA  Final      Susceptibility   Pseudomonas aeruginosa - MIC*    CEFEPIME 32 RESISTANT Resistant     CEFTAZIDIME >=64 RESISTANT Resistant     CIPROFLOXACIN >=4 RESISTANT Resistant  GENTAMICIN <=1 SENSITIVE Sensitive     IMIPENEM >=16 RESISTANT Resistant     TOBRAMYCIN <=1 SENSITIVE Sensitive     * ABUNDANT PSEUDOMONAS AERUGINOSA  C difficile quick scan w PCR reflex     Status: Abnormal   Collection Time: 12/05/15  6:01 PM  Result Value Ref Range Status   C Diff antigen POSITIVE (A)  NEGATIVE Final   C Diff toxin POSITIVE (A) NEGATIVE Final   C Diff interpretation Positive for toxigenic C. difficile  Final    Comment: CRITICAL RESULT CALLED TO, READ BACK BY AND VERIFIED WITH: Bryson Dames RN [id number] 12/05/15 A BROWNING   Culture, respiratory (tracheal aspirate)     Status: None   Collection Time: 12/05/15  8:53 PM  Result Value Ref Range Status   Specimen Description TRACHEAL ASPIRATE  Final   Special Requests NONE  Final   Gram Stain   Final    ABUNDANT WBC PRESENT, PREDOMINANTLY PMN FEW SQUAMOUS EPITHELIAL CELLS PRESENT ABUNDANT GRAM NEGATIVE RODS Performed at [facility]    Culture   Final    MODERATE PSEUDOMONAS AERUGINOSA Performed at [facility]    Report Status 12/08/2015 FINAL  Final   Organism ID, Bacteria PSEUDOMONAS AERUGINOSA  Final      Susceptibility   Pseudomonas aeruginosa - MIC*    CEFEPIME 16 INTERMEDIATE Intermediate     CEFTAZIDIME 32 RESISTANT Resistant     CIPROFLOXACIN 1 SENSITIVE Sensitive     GENTAMICIN <=1 SENSITIVE Sensitive     IMIPENEM >=16 RESISTANT Resistant     TOBRAMYCIN <=1 SENSITIVE Sensitive     * MODERATE PSEUDOMONAS AERUGINOSA  Culture, blood (Routine X 2) w Reflex to ID Panel     Status: None (Preliminary result)   Collection Time: 12/09/15 12:00 PM  Result Value Ref Range Status   Specimen Description BLOOD RIGHT ARM  Final   Special Requests IN PEDIATRIC BOTTLE 3CC  Final   Culture  Setup Time   Final    GRAM POSITIVE COCCI IN CLUSTERS IN PEDIATRIC BOTTLE CRITICAL RESULT CALLED TO, READ BACK BY AND VERIFIED WITH: C [place] A T 0904 12/10/15 BY L BENFIELD    Culture NO GROWTH < 24 HOURS  Final   Report Status PENDING  Incomplete  Culture, blood (routine x 2)     Status: None (Preliminary result)   Collection Time: 12/09/15 12:20 PM  Result Value Ref Range Status   Specimen Description BLOOD RIGHT HAND  Final   Special Requests IN PEDIATRIC BOTTLE 3CC  Final   Culture NO GROWTH < 24 HOURS  Final    Report Status PENDING  Incomplete  Culture, blood (routine x 2)     Status: None (Preliminary result)   Collection Time: 12/09/15  3:45 PM  Result Value Ref Range Status   Specimen Description BLOOD LEFT ARM  Final   Special Requests IN PEDIATRIC BOTTLE 1.5CC  Final   Culture NO GROWTH < 24 HOURS  Final   Report Status PENDING  Incomplete     Studies:  Recent x-ray studies have been reviewed in detail by the Attending Physician  Scheduled Meds:  Scheduled Meds: . antiseptic oral rinse  7 mL Mouth Rinse QID  . ceftazidime avibactam (AVYCAZ) IVPB  1.25 g Intravenous Q8H  . chlorhexidine gluconate (SAGE KIT)  15 mL Mouth Rinse BID  . collagenase   Topical Daily  . famotidine (PEPCID) IV  20 mg Intravenous Q12H  . gentamicin  100 mg Intravenous Q24H  .  heparin  5,000 Units Subcutaneous Q8H  . insulin aspart  0-9 Units Subcutaneous Q4H  . metronidazole  500 mg Intravenous Q8H  . mupirocin ointment  1 application Nasal BID  . vancomycin  500 mg Oral Q6H  . vancomycin  500 mg Intravenous Q24H    Time spent on care of this patient: 40 mins   WOODS, Geraldo Docker , MD  [facility] Office  [phone] Pager - [phone]  On-Call/Text Page:      [email or web address]      password [email or web address]  If 7PM-7AM, please contact night-coverage [email or web address] Password [email or web address] 12/11/2015, 12:00 PM   LOS: 7 days   Care during the described time interval was provided by me .  I have reviewed this patient's available data, including medical history, events of note, physical examination, and all test results as part of my evaluation. I have personally reviewed and interpreted all radiology studies.   Dia Crawford, MD [phone] Pager

## 2015-12-11 NOTE — Progress Notes (Signed)
Peripherally Inserted Central Catheter/Midline Placement  The IV Nurse has discussed with the patient and/or persons authorized to consent for the patient, the purpose of this procedure and the potential benefits and risks involved with this procedure.  The benefits include less needle sticks, lab draws from the catheter and patient may be discharged home with the catheter.  Risks include, but not limited to, infection, bleeding, blood clot (thrombus formation), and puncture of an artery; nerve damage and irregular heat beat.  Alternatives to this procedure were also discussed.  Consent signed by Dr. Carolyne Littlesurtis Woods for medical necessity due to inability to obtain consent from family.  PICC/Midline Placement Documentation        Kristen Arnold, Kristen Arnold 12/11/2015, 7:11 PM

## 2015-12-11 NOTE — Progress Notes (Signed)
12/11/2015- Respiratory care note- Pt continuing with full vent support and short weans on aerosol trach collar as tolerated.  Pt with increased resp rate with wean earlier today.

## 2015-12-11 NOTE — Progress Notes (Signed)
PULMONARY / CRITICAL CARE MEDICINE   Name: Kristen ChessJudy Ehinger MRN: [id number] DOB: 03/15/1946    ADMISSION DATE:  12/04/2015 CONSULTATION DATE:  12/04/15  REFERRING MD:  EDP  CHIEF COMPLAINT:  AMS  70 year old woman transferred from [facility] due to being unresponsive for 24 hours. She was noted to be hypotensive with a hemoglobin of 6.1, chest x-ray showed a left lower lobe airspace disease. She had right hemicolectomy for small bowel obstruction in December 2016 and required prolonged mechanical ventilation and tracheostomy/ PEG placement. She was hospitalized in 09/2015 for aspiration pneumonia-blood cultures positive for coag-negative staph and Pseudomonas-related to pneumonia/ HCAP. she also has a history of recurrent Clostridium difficile diarrhea and severe protein calorie malnutrition    Subjective- Did ATC x 1 h yesterday afebrile  VITAL SIGNS: BP 121/65 mmHg  Pulse 95  Temp(Src) 97.8 F (36.6 C) (Oral)  Resp 15  Ht 5' (1.524 m)  Wt 117 lb 15.1 oz (53.5 kg)  BMI 23.03 kg/m2  SpO2 94%  HEMODYNAMICS:    VENTILATOR SETTINGS: Vent Mode:  [-] PRVC FiO2 (%):  [40 %] 40 % Set Rate:  [16 bmp] 16 bmp Vt Set:  [480 mL] 480 mL PEEP:  [5 cmH20] 5 cmH20 Plateau Pressure:  [18 cmH20-23 cmH20] 23 cmH20  INTAKE / OUTPUT: I/O last 3 completed shifts: In: 2150 [I.V.:1500; IV Piggyback:650] Out: 1600 [Urine:1200; Other:400]   PHYSICAL EXAMINATION:  General: Chronically ill appearing elderly female, cachectic, in NAD,alert. Neuro: opens eyes spont,  Following commands, contracted uppers chronic. Attempting to speak HEENT: [place]/AT. PERRL, trach clean Cardiovascular: s1 s2 RRR  Lungs: coarse, left Abdomen: BS x 4, soft, NT/ND. PEG out, pouch to site with bilious drainage. Musculoskeletal: No gross deformities, no edema.  Skin: Intact, warm, no rashes.   LABS:  BMET  Recent Labs Lab 12/06/15 0533 12/09/15 2000 12/11/15 0453  NA 141 150* 155*  K 4.1 3.6 3.3*  CL 95* 115*  118*  CO2 33* 22 25  BUN 141* 63* 45*  CREATININE 1.38* 1.04* 1.05*  GLUCOSE 184* 123* 132*    Electrolytes  Recent Labs Lab 12/05/15 0400 12/06/15 0533 12/09/15 2000 12/11/15 0453 12/11/15 1140  CALCIUM 8.1* 7.5* 7.6* 7.9*  --   MG 2.3  --   --   --  1.4*  PHOS 4.6  --   --   --   --     CBC  Recent Labs Lab 12/05/15 0400 12/06/15 0533 12/11/15 0453  WBC 59.1* 35.1* 13.2*  HGB 7.3* 8.7* 8.4*  HCT 23.6* 26.3* 28.7*  PLT 653* 596* 579*    Coag's No results for input(s): APTT, INR in the last 168 hours.  Sepsis Markers  Recent Labs Lab 12/04/15 2003 12/04/15 2230 12/05/15 1110  LATICACIDVEN 2.53* 3.4* 2.1*    ABG  Recent Labs Lab 12/04/15 1912 12/05/15 0325  PHART 7.356 7.341*  PCO2ART 82.7* 69.6*  PO2ART 72.0* 178*    Liver Enzymes  Recent Labs Lab 12/04/15 1928 12/06/15 0533 12/11/15 0453  AST 26 21 11*  ALT 14 15 10*  ALKPHOS 240* 100 78  BILITOT 0.5 0.5 0.8  ALBUMIN 1.2* 1.1* 1.2*    Cardiac Enzymes  Recent Labs Lab 12/05/15 0400 12/05/15 1110  TROPONINI 0.27* 0.18*    Glucose  Recent Labs Lab 12/10/15 1700 12/10/15 1946 12/10/15 2259 12/11/15 0351 12/11/15 0853 12/11/15 1324  GLUCAP 117* 119* 96 102* 135* 128*    Imaging No results found. STUDIES:  CXR 05/04 > left HCAP  CULTURES:  Blood reflex 05/04 >> klebsiella pneumonia, enterococcus, pseudomonas aeruginosa and carbapenem resistance BCx2 5/4 >> gram neg rod>>> KLEBSIELLA PNEUMONIAE  PSEUDOMONAS AERUGINOSA  ENTEROCOCCUS SPECIES MDRO Urine 05/04 >> Sputum 05/04 >>pseudomons aur  ANTIBIOTICS: Vanc 05/04 >> Oral Vanc 05/04>>05/08 ( PEG d/c'd) Zosyn 05/04 >>off Cipro 05/04 >>off Flagyl 5/5>> Ceftaz/ avibactam 5/5 >>  SIGNIFICANT EVENTS: 5/04  Admitted with septic shock presumed due to HCAP as well as anemia (Hgb 6.1) 5/6- collapse, peep, dnr 5/7- some improved aeration left 5/9 to SDU  LINES/TUBES: Trach (chronic) >> J tube (chronic) >> removed   L PICC (chronic) >> will remove 05/04 as it is leaking. CVL L IJ 5/04 >>  DISCUSSION: 70 y.o. F with chronic trach / J tube from [facility], admitted 05/04 with septic shock and anemia.DNR status established  5/6, no escalation of care.   ASSESSMENT / PLAN:  Chronic tracheostomy / vent dependence. LLL HCAP. COPD without acute exacerbation New collapse / effusion / ATX left - PNA --Daily SBT with goal ATC   Septic shock - > resolved -Will not re-initiaite pressors  Septic shock - presumed due to HCAP.  Hx pseudomonal and coag neg staph bacteremia last admission in Feb Bacteremia klebsiella, pseudomonas, enterococcus MDRO Hx C.diff. PNA left withj collapse, pseudomonas Not receiving PO Vanc 2/2 PEG removal( ID aware) - ID following  She is awake following commands She tolerated limited ATC trials   Need to Revisit goals of care here, in my opinion Would suggest re insert PEJ & tr back to [facility] with DNR/ no escalation vs hospice  Cyril Mourning MD. FCCP. [facility] Pager [phone] If no response call [phone]   12/11/2015    12/11/2015 2:22 PM

## 2015-12-11 NOTE — Progress Notes (Signed)
  Echocardiogram 2D Echocardiogram has been performed.  Janalyn HarderWest, Jerah Esty R 12/11/2015, 2:08 PM

## 2015-12-12 ENCOUNTER — Inpatient Hospital Stay (HOSPITAL_COMMUNITY): Payer: Medicare Other

## 2015-12-12 DIAGNOSIS — Z930 Tracheostomy status: Secondary | ICD-10-CM

## 2015-12-12 LAB — GLUCOSE, CAPILLARY
GLUCOSE-CAPILLARY: 142 mg/dL — AB (ref 65–99)
GLUCOSE-CAPILLARY: 135 mg/dL — AB (ref 65–99)
GLUCOSE-CAPILLARY: 135 mg/dL — AB (ref 65–99)
Glucose-Capillary: 107 mg/dL — ABNORMAL HIGH (ref 65–99)
Glucose-Capillary: 126 mg/dL — ABNORMAL HIGH (ref 65–99)
Glucose-Capillary: 128 mg/dL — ABNORMAL HIGH (ref 65–99)

## 2015-12-12 LAB — CULTURE, BLOOD (ROUTINE X 2)

## 2015-12-12 LAB — VANCOMYCIN, TROUGH: VANCOMYCIN TR: 21 ug/mL — AB (ref 10.0–20.0)

## 2015-12-12 MED ORDER — DIATRIZOATE MEGLUMINE & SODIUM 66-10 % PO SOLN
ORAL | Status: AC
  Administered 2015-12-12: 30 mL
  Filled 2015-12-12: qty 30

## 2015-12-12 MED ORDER — LIDOCAINE VISCOUS 2 % MT SOLN
OROMUCOSAL | Status: AC
  Administered 2015-12-12: 15 mL via NASAL
  Filled 2015-12-12: qty 15

## 2015-12-12 MED ORDER — GENTAMICIN IN SALINE 1-0.9 MG/ML-% IV SOLN
100.0000 mg | INTRAVENOUS | Status: DC
  Filled 2015-12-12: qty 100

## 2015-12-12 MED ORDER — VANCOMYCIN HCL IN DEXTROSE 750-5 MG/150ML-% IV SOLN
750.0000 mg | INTRAVENOUS | Status: AC
  Administered 2015-12-13 – 2015-12-17 (×4): 750 mg via INTRAVENOUS
  Filled 2015-12-12 (×4): qty 150

## 2015-12-12 NOTE — Progress Notes (Signed)
[place] TEAM 1 - Stepdown/ICU TEAM  Kristen Arnold  MRN:[id number] DOB: 04/01/1946 DOA: 12/04/2015 PCP: CROWLEY, MCKAY, MD    Brief Narrative:  70 y.o. 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 She had a hospitalization to [facility] ICU 09/26/15 through 10/01/15 for aspiration PNA. During that admission she failed a swallow eval so her chronic G tube was exchanged for J tube by IR. Blood cultures that admission were noteable for coag neg staph and pseudomonas aeruginosa, sputum cultures were also noteable for pseudomonas (sputum resistant to both imipenem and zosyn; sputum and blood were both only sensitive to cipro). Due to her hx of C.diff, she was started on oral vanc per recs from ID.  Following her course of abx, she was discharged back to [facility].  On 05/04, she was brought to [facility] ED due to reports of AMS w/ minimal responsiveness x 24 hours. In the ED she was found to have septic shock presumed due to LLL HCAP as well as anemia with Hgb 6.1. She was started on levophed and 2u PRBC were ordered.  Significant Events: 5/4 Admitted with septic shock presumed due to HCAP as well as anemia (Hgb 6.1) 5/6 L lung collapse, peep, dnr 5/7 some improved aeration left 5/12 failed attempts to place NG feeding tube   Assessment & Plan:  Chronic tracheostomy / vent dependence Care per PCCM - attempts at weaning of late have been unsuccessful   LLL HCAP w/ septic shock  Required levophed earlier in hospital stay - Hx pseudomonal and coag neg staph bacteremia Feb  Polymicrobial bacteremia with Enterococcus, CRE klebsiella, pseudomonas  abx continue per ID suggestions   LLL collapse  Care per PCCM - ongoing chest PT - increased PEEP - f/u CXR in AM   C diff colitis On flagyl per ID    COPD No evidence of acute exacerbation   Anemia - acute on chronic Hgb presently stable    Recent Labs Lab  12/06/15 0533 12/11/15 0453  HGB 8.7* 8.4*   Hx DVT  Sacral decubitus ulcer stage IV Care as per WOC Team  Altered mental status  likely due to sepsis and severe uremia - mental status appears to be improving   Hx anxiety / depression + chronic pain Calm at time of exam today   AKI - in setting of shock crt appears to be approaching normal   Recent Labs Lab 12/06/15 0533 12/09/15 2000 12/11/15 0453  CREATININE 1.38* 1.04* 1.05*    Severe malnutrition in context of chronic illness Presently has no means of ongoing nutritional support (J tube removed this hospitalization as it was not working) - attempts to place NG tube 5/12 failed despite fluoroscopy - will ask IR to replace PEG tube  DM CBG stable   MRSA screen +   DVT prophylaxis: SQ heparin  Code Status: NO CODE BLUE - DNR  Family Communication: no family present at time of exam  Disposition Plan: SDU   Consultants:  ID PCCM Palliative Care   Procedures:  CVL L IJ 5/04 >  Antimicrobials:  Per ID  Subjective: The patient is alert today.  She is not able to provide a detailed history but does not appear anxious or dyspneic.  Objective: Blood pressure 154/80, pulse 89, temperature 97.8 F (36.6 C), temperature source Axillary, resp. rate 23, height 5' (1.524 m), weight 52.8 kg (116 lb 6.5 oz), SpO2 93 %.  Intake/Output   Summary (Last 24 hours) at 12/12/15 1540 Last data filed at 12/12/15 1500  Gross per 24 hour  Intake 1686.25 ml  Output    845 ml  Net 841.25 ml   Filed Weights   12/10/15 0311 12/11/15 0341 12/12/15 0500  Weight: 53.7 kg (118 lb 6.2 oz) 53.5 kg (117 lb 15.1 oz) 52.8 kg (116 lb 6.5 oz)    Examination: General: No acute respiratory distress evident on vent  Lungs: poor air movement B bases - no wheeze  Cardiovascular: Regular without appreciable murmur Abdomen: Nontender, soft, bowel sounds positive, no rebound Extremities: No significant cyanosis, clubbing, edema bilateral  lower extremities   CBC:  Recent Labs Lab 12/06/15 0533 12/11/15 0453  WBC 35.1* 13.2*  HGB 8.7* 8.4*  HCT 26.3* 28.7*  MCV 86.8 90.5  PLT 596* 790*   Basic Metabolic Panel:  Recent Labs Lab 12/06/15 0533 12/09/15 2000 12/11/15 0453 12/11/15 1140  NA 141 150* 155*  --   K 4.1 3.6 3.3*  --   CL 95* 115* 118*  --   CO2 33* 22 25  --   GLUCOSE 184* 123* 132*  --   BUN 141* 63* 45*  --   CREATININE 1.38* 1.04* 1.05*  --   CALCIUM 7.5* 7.6* 7.9*  --   MG  --   --   --  1.4*   GFR: Estimated Creatinine Clearance: 35.8 mL/min (by C-G formula based on Cr of 1.05).   Liver Function Tests:  Recent Labs Lab 12/06/15 0533 12/11/15 0453  AST 21 11*  ALT 15 10*  ALKPHOS 100 78  BILITOT 0.5 0.8  PROT 5.5* 5.2*  ALBUMIN 1.1* 1.2*   CBG:  Recent Labs Lab 12/11/15 2018 12/12/15 0105 12/12/15 0323 12/12/15 0831 12/12/15 1155  GLUCAP 111* 107* 126* 142* 135*    Recent Results (from the past 240 hour(s))  Blood Culture (routine x 2)     Status: Abnormal (Preliminary result)   Collection Time: 12/04/15  7:28 PM  Result Value Ref Range Status   Specimen Description BLOOD PICC LINE  Final   Special Requests BOTTLES DRAWN AEROBIC AND ANAEROBIC 5CC  Final   Culture  Setup Time   Final    Organism ID to follow GRAM POSITIVE COCCI GRAM NEGATIVE RODS IN BOTH AEROBIC AND ANAEROBIC BOTTLES CRITICAL RESULT CALLED TO, READ BACK BY AND VERIFIED WITH: C. STEWART, [place] D AT [place] ON [id number] BY S. YARBROUGH    Culture (A)  Final    KLEBSIELLA PNEUMONIAE CARBAPENEMASE RESISTANT ENTEROBACTERIACAE PSEUDOMONAS AERUGINOSA ENTEROCOCCUS SPECIES RESULT CALLED TO, READ BACK BY AND VERIFIED WITH: DR HATCHER 12/08/15 @ 37 M VESTAL NOTIFIED M DAVIS W/ INFECTION PREVENTION 12/08/15 @ [place] TO FOLLOW FOR KLEBSIELLA AND PSEUDOMONAS    Report Status PENDING  Incomplete   Organism ID, Bacteria KLEBSIELLA PNEUMONIAE  Final   Organism ID, Bacteria PSEUDOMONAS  AERUGINOSA  Final   Organism ID, Bacteria ENTEROCOCCUS SPECIES  Final      Susceptibility   Klebsiella pneumoniae - MIC*    AMPICILLIN >=32 RESISTANT Resistant     CEFAZOLIN >=64 RESISTANT Resistant     CEFEPIME 16 RESISTANT Resistant     CEFTAZIDIME >=64 RESISTANT Resistant     CEFTRIAXONE >=64 RESISTANT Resistant     CIPROFLOXACIN >=4 RESISTANT Resistant     GENTAMICIN >=16 RESISTANT Resistant     IMIPENEM 8 RESISTANT Resistant     TRIMETH/SULFA >=320 RESISTANT Resistant     AMPICILLIN/SULBACTAM >=32 RESISTANT  Resistant     PIP/TAZO >=128 RESISTANT Resistant     * KLEBSIELLA PNEUMONIAE   Pseudomonas aeruginosa - MIC*    CEFTAZIDIME >=64 RESISTANT Resistant     CIPROFLOXACIN 2 INTERMEDIATE Intermediate     GENTAMICIN <=1 SENSITIVE Sensitive     IMIPENEM >=16 RESISTANT Resistant     CEFEPIME 32 RESISTANT Resistant     * PSEUDOMONAS AERUGINOSA   Enterococcus species - MIC*    AMPICILLIN 4 RESISTANT Resistant     VANCOMYCIN 4 SENSITIVE Sensitive     GENTAMICIN SYNERGY RESISTANT Resistant     * ENTEROCOCCUS SPECIES  Blood Culture (routine x 2)     Status: Abnormal   Collection Time: 12/04/15  7:28 PM  Result Value Ref Range Status   Specimen Description BLOOD PICC LINE  Final   Special Requests BOTTLES DRAWN AEROBIC AND ANAEROBIC 5CC  Final   Culture  Setup Time   Final    GRAM POSITIVE COCCI GRAM NEGATIVE RODS IN BOTH AEROBIC AND ANAEROBIC BOTTLES CRITICAL RESULT CALLED TO, READ BACK BY AND VERIFIED WITH: C. STEWART, PHARM D AT 0855 ON [id number] BY Rhea Bleacher    Culture (A)  Final    KLEBSIELLA PNEUMONIAE PSEUDOMONAS AERUGINOSA ENTEROCOCCUS SPECIES SUSCEPTIBILITIES PERFORMED ON PREVIOUS CULTURE WITHIN THE LAST 5 DAYS.    Report Status 12/10/2015 FINAL  Final  Blood Culture ID Panel (Reflexed)     Status: Abnormal   Collection Time: 12/04/15  7:28 PM  Result Value Ref Range Status   Enterococcus species DETECTED (A) NOT DETECTED Final    Comment: CRITICAL RESULT CALLED  TO, READ BACK BY AND VERIFIED WITH: C. STEWART, PHARM D AT 0855 ON [id number] BY S. YARBROUGH    Vancomycin resistance NOT DETECTED NOT DETECTED Final   Listeria monocytogenes NOT DETECTED NOT DETECTED Final   Staphylococcus species NOT DETECTED NOT DETECTED Final   Staphylococcus aureus NOT DETECTED NOT DETECTED Final   Methicillin resistance NOT DETECTED NOT DETECTED Final   Streptococcus species NOT DETECTED NOT DETECTED Final   Streptococcus agalactiae NOT DETECTED NOT DETECTED Final   Streptococcus pneumoniae NOT DETECTED NOT DETECTED Final   Streptococcus pyogenes NOT DETECTED NOT DETECTED Final   Acinetobacter baumannii NOT DETECTED NOT DETECTED Final   Enterobacteriaceae species NOT DETECTED NOT DETECTED Final   Enterobacter cloacae complex NOT DETECTED NOT DETECTED Final   Escherichia coli NOT DETECTED NOT DETECTED Final   Klebsiella oxytoca NOT DETECTED NOT DETECTED Final   Klebsiella pneumoniae DETECTED (A) NOT DETECTED Final    Comment: CRITICAL RESULT CALLED TO, READ BACK BY AND VERIFIED WITH: C. STEWART,PHARM D AT 0855 ON [phone] BY S. YARBROUGH    Proteus species NOT DETECTED NOT DETECTED Final   Serratia marcescens NOT DETECTED NOT DETECTED Final   Carbapenem resistance DETECTED (A) NOT DETECTED Final    Comment: CRITICAL RESULT CALLED TO, READ BACK BY AND VERIFIED WITH: C. STEWART,PHARM D AT 0855 ON [id number] BY S. YARBROUGH AND NOTIFIED NATALIE DAVIS IN INFECTION CONTROL AT 0900 ON [id number] BY S. YARBROUGH    Haemophilus influenzae NOT DETECTED NOT DETECTED Final   Neisseria meningitidis NOT DETECTED NOT DETECTED Final   Pseudomonas aeruginosa DETECTED (A) NOT DETECTED Final    Comment: CRITICAL RESULT CALLED TO, READ BACK BY AND VERIFIED WITH: C. STEWART,PHARM D AT 0855 ON [id number] BY S. YARBROUGH    Candida albicans NOT DETECTED NOT DETECTED Final   Candida glabrata NOT DETECTED NOT DETECTED Final   Candida krusei NOT DETECTED NOT DETECTED Final  Candida parapsilosis NOT  DETECTED NOT DETECTED Final   Candida tropicalis NOT DETECTED NOT DETECTED Final  Urine culture     Status: None   Collection Time: 12/04/15  7:48 PM  Result Value Ref Range Status   Specimen Description URINE, CATHETERIZED  Final   Special Requests NONE  Final   Culture NO GROWTH 1 DAY  Final   Report Status 12/06/2015 FINAL  Final  MRSA PCR Screening     Status: Abnormal   Collection Time: 12/05/15 12:04 AM  Result Value Ref Range Status   MRSA by PCR POSITIVE (A) NEGATIVE Final    Comment:        The GeneXpert MRSA Assay (FDA approved for NASAL specimens only), is one component of a comprehensive MRSA colonization surveillance program. It is not intended to diagnose MRSA infection nor to guide or monitor treatment for MRSA infections. RESULT CALLED TO, READ BACK BY AND VERIFIED WITH: J. TAYLOR RN 10:40 12/05/15 ([email or web address])   Wound culture     Status: None   Collection Time: 12/05/15  7:30 AM  Result Value Ref Range Status   Specimen Description WOUND  Final   Special Requests SACRAL  Final   Gram Stain   Final    FEW WBC PRESENT, PREDOMINANTLY PMN RARE SQUAMOUS EPITHELIAL CELLS PRESENT ABUNDANT GRAM NEGATIVE RODS MODERATE GRAM POSITIVE COCCI IN PAIRS FEW GRAM POSITIVE RODS Performed at [facility]    Culture   Final    ABUNDANT PSEUDOMONAS AERUGINOSA DRUG RESISTANT Note: COLISTIN SENSITIVE 2 ug/mL ETEST results for this drug are "FOR INVESTIGATIONAL USE ONLY" and should NOT be used for clinical purposes. MUTLI CRITICAL RESULT CALLED TO, READ BACK BY AND VERIFIED WITH: CAROLYN (RN)@ 10:58AM ON  12/10/2015 HAJAM Performed at [facility]    Report Status 12/10/2015 FINAL  Final   Organism ID, Bacteria PSEUDOMONAS AERUGINOSA  Final      Susceptibility   Pseudomonas aeruginosa - MIC*    CEFEPIME 32 RESISTANT Resistant     CEFTAZIDIME >=64 RESISTANT Resistant     CIPROFLOXACIN >=4 RESISTANT Resistant     GENTAMICIN <=1 SENSITIVE Sensitive     IMIPENEM  >=16 RESISTANT Resistant     TOBRAMYCIN <=1 SENSITIVE Sensitive     * ABUNDANT PSEUDOMONAS AERUGINOSA  C difficile quick scan w PCR reflex     Status: Abnormal   Collection Time: 12/05/15  6:01 PM  Result Value Ref Range Status   C Diff antigen POSITIVE (A) NEGATIVE Final   C Diff toxin POSITIVE (A) NEGATIVE Final   C Diff interpretation Positive for toxigenic C. difficile  Final    Comment: CRITICAL RESULT CALLED TO, READ BACK BY AND VERIFIED WITH: J DUNLOP RN 1929 12/05/15 A BROWNING   Culture, respiratory (tracheal aspirate)     Status: None   Collection Time: 12/05/15  8:53 PM  Result Value Ref Range Status   Specimen Description TRACHEAL ASPIRATE  Final   Special Requests NONE  Final   Gram Stain   Final    ABUNDANT WBC PRESENT, PREDOMINANTLY PMN FEW SQUAMOUS EPITHELIAL CELLS PRESENT ABUNDANT GRAM NEGATIVE RODS Performed at [facility]    Culture   Final    MODERATE PSEUDOMONAS AERUGINOSA Performed at [facility]    Report Status 12/08/2015 FINAL  Final   Organism ID, Bacteria PSEUDOMONAS AERUGINOSA  Final      Susceptibility   Pseudomonas aeruginosa - MIC*    CEFEPIME 16 INTERMEDIATE Intermediate     CEFTAZIDIME   32 RESISTANT Resistant     CIPROFLOXACIN 1 SENSITIVE Sensitive     GENTAMICIN <=1 SENSITIVE Sensitive     IMIPENEM >=16 RESISTANT Resistant     TOBRAMYCIN <=1 SENSITIVE Sensitive     * MODERATE PSEUDOMONAS AERUGINOSA  Culture, blood (Routine X 2) w Reflex to ID Panel     Status: Abnormal   Collection Time: 12/09/15 12:00 PM  Result Value Ref Range Status   Specimen Description BLOOD RIGHT ARM  Final   Special Requests IN PEDIATRIC BOTTLE 3CC  Final   Culture  Setup Time   Final    GRAM POSITIVE COCCI IN CLUSTERS IN PEDIATRIC BOTTLE CRITICAL RESULT CALLED TO, READ BACK BY AND VERIFIED WITH: C [place] A T 0904 12/10/15 BY L BENFIELD    Culture (A)  Final    STAPHYLOCOCCUS SPECIES (COAGULASE NEGATIVE) THE SIGNIFICANCE OF ISOLATING THIS  ORGANISM FROM A SINGLE SET OF BLOOD CULTURES WHEN MULTIPLE SETS ARE DRAWN IS UNCERTAIN. PLEASE NOTIFY THE MICROBIOLOGY DEPARTMENT WITHIN ONE WEEK IF SPECIATION AND SENSITIVITIES ARE REQUIRED.    Report Status 12/12/2015 FINAL  Final  Culture, blood (routine x 2)     Status: None (Preliminary result)   Collection Time: 12/09/15 12:20 PM  Result Value Ref Range Status   Specimen Description BLOOD RIGHT HAND  Final   Special Requests IN PEDIATRIC BOTTLE 3CC  Final   Culture NO GROWTH 3 DAYS  Final   Report Status PENDING  Incomplete  Culture, blood (routine x 2)     Status: None (Preliminary result)   Collection Time: 12/09/15  3:45 PM  Result Value Ref Range Status   Specimen Description BLOOD LEFT ARM  Final   Special Requests IN PEDIATRIC BOTTLE 1.5CC  Final   Culture NO GROWTH 3 DAYS  Final   Report Status PENDING  Incomplete      Scheduled Meds: . antiseptic oral rinse  7 mL Mouth Rinse QID  . ceftazidime avibactam (AVYCAZ) IVPB  1.25 g Intravenous Q8H  . chlorhexidine gluconate (SAGE KIT)  15 mL Mouth Rinse BID  . collagenase   Topical Daily  . famotidine (PEPCID) IV  20 mg Intravenous Q12H  . gentamicin  100 mg Intravenous Q24H  . heparin  5,000 Units Subcutaneous Q8H  . insulin aspart  0-9 Units Subcutaneous Q4H  . metronidazole  500 mg Intravenous Q8H  . sodium chloride flush  10-40 mL Intracatheter Q12H  . vancomycin  500 mg Oral Q6H  . vancomycin  500 mg Intravenous Q24H   Continuous Infusions: . dextrose 5 % with kcl 75 mL/hr at 12/12/15 0959     LOS: 8 days    Time spent: 25 minutes   Cherene Altes, MD [facility] Office  [phone] Pager - Text Page per Shea Evans as per below:  On-Call/Text Page:      [email or web address]      password [email or web address]  If 7PM-7AM, please contact night-coverage [email or web address] Password [facility] 12/12/2015, 3:40 PM

## 2015-12-12 NOTE — Progress Notes (Signed)
Pt coughing on vent - Radiology attempting to place nasal gastric  feeding tube per fluroscopy. Sats dropped to 88 %. Suctioned x 3 sats back up and ST now 120's

## 2015-12-12 NOTE — Progress Notes (Signed)
[facility]Fluro. Pt will have procedure to place feeding tube later today. Xray with fluoro will attempt later today. On 5/11 - Many attempts to place a feeding tube without ability to pass a feeding tube. MD aware

## 2015-12-12 NOTE — Progress Notes (Addendum)
INFECTIOUS DISEASE PROGRESS NOTE  ID: Kristen Arnold is a 70 y.o. female with  Active Problems:   Sepsis ([place])   Encounter for central line placement   Clostridium difficile colitis   CRKP (carbapenem-resistant Klebsiella pneumoniae) infection   Palliative care encounter   Acute respiratory failure ([place])   Bacteremia due to Enterococcus   Bacteremia due to Klebsiella pneumoniae   Bacteremia due to Pseudomonas   C. difficile colitis   Dysphagia   Sepsis due to Pseudomonas ([place])  Subjective: Awake, does not respond or follow commands 1h weaning time today  Abtx:  Anti-infectives    Start     Dose/Rate Route Frequency Ordered Stop   12/10/15 2100  gentamicin (GARAMYCIN) IVPB 100 mg     100 mg 200 mL/hr over 30 Minutes Intravenous Every 24 hours 12/10/15 1056     12/10/15 1200  ceftazidime-avibactam (AVYCAZ) 1.25 g in dextrose 5 % 50 mL IVPB     1.25 g 25 mL/hr over 2 Hours Intravenous Every 8 hours 12/10/15 1057     12/09/15 2300  vancomycin (VANCOCIN) 500 mg in sodium chloride 0.9 % 100 mL IVPB     500 mg 100 mL/hr over 60 Minutes Intravenous Every 24 hours 12/09/15 2109     12/08/15 1600  gentamicin (GARAMYCIN) 360 mg in dextrose 5 % 100 mL IVPB     360 mg 109 mL/hr over 60 Minutes Intravenous  Once 12/08/15 1457 12/08/15 1716   12/06/15 2200  levofloxacin (LEVAQUIN) IVPB 500 mg  Status:  Discontinued     500 mg 100 mL/hr over 60 Minutes Intravenous Every 48 hours 12/04/15 2229 12/04/15 2229   12/06/15 1600  vancomycin (VANCOCIN) 50 mg/mL oral solution 500 mg     500 mg Oral Every 6 hours 12/06/15 1451 12/20/15 1159   12/06/15 1445  vancomycin (VANCOCIN) 50 mg/mL oral solution 500 mg  Status:  Discontinued     500 mg Oral Every 6 hours 12/06/15 1436 12/06/15 1451   12/05/15 2000  vancomycin (VANCOCIN) 500 mg in sodium chloride 0.9 % 100 mL IVPB  Status:  Discontinued     500 mg 100 mL/hr over 60 Minutes Intravenous Every 24 hours 12/04/15 2229 12/09/15 2109   12/05/15  1800  vancomycin (VANCOCIN) 50 mg/mL oral solution 500 mg  Status:  Discontinued     500 mg Oral Every 6 hours 12/05/15 1531 12/05/15 1606   12/05/15 1700  metroNIDAZOLE (FLAGYL) IVPB 500 mg     500 mg 100 mL/hr over 60 Minutes Intravenous Every 8 hours 12/05/15 1606     12/05/15 1100  ceftazidime-avibactam (AVYCAZ) 0.94 g in dextrose 5 % 50 mL IVPB  Status:  Discontinued     0.94 g 25 mL/hr over 2 Hours Intravenous Every 12 hours 12/05/15 1008 12/10/15 1057   12/05/15 1015  ceftazidime-avibactam (AVYCAZ) 0.94 g in dextrose 5 % 50 mL IVPB  Status:  Discontinued     0.94 g 25 mL/hr over 2 Hours Intravenous Every 12 hours 12/05/15 1008 12/05/15 1008   12/05/15 0400  piperacillin-tazobactam (ZOSYN) IVPB 3.375 g  Status:  Discontinued     3.375 g 12.5 mL/hr over 240 Minutes Intravenous Every 8 hours 12/04/15 2229 12/05/15 1006   12/04/15 2300  ciprofloxacin (CIPRO) IVPB 200 mg  Status:  Discontinued     200 mg 100 mL/hr over 60 Minutes Intravenous Every 24 hours 12/04/15 2235 12/05/15 1006   12/04/15 2230  levofloxacin (LEVAQUIN) IVPB 750 mg  Status:  Discontinued  750 mg 100 mL/hr over 90 Minutes Intravenous  Once 12/04/15 2229 12/04/15 2229   12/04/15 1930  vancomycin (VANCOCIN) IVPB 1000 mg/200 mL premix     1,000 mg 200 mL/hr over 60 Minutes Intravenous  Once 12/04/15 1924 12/04/15 2041   12/04/15 1930  piperacillin-tazobactam (ZOSYN) IVPB 3.375 g     3.375 g 100 mL/hr over 30 Minutes Intravenous  Once 12/04/15 1924 12/04/15 1958      Medications:  Scheduled: . antiseptic oral rinse  7 mL Mouth Rinse QID  . ceftazidime avibactam (AVYCAZ) IVPB  1.25 g Intravenous Q8H  . chlorhexidine gluconate (SAGE KIT)  15 mL Mouth Rinse BID  . collagenase   Topical Daily  . famotidine (PEPCID) IV  20 mg Intravenous Q12H  . gentamicin  100 mg Intravenous Q24H  . heparin  5,000 Units Subcutaneous Q8H  . insulin aspart  0-9 Units Subcutaneous Q4H  . metronidazole  500 mg Intravenous Q8H  .  sodium chloride flush  10-40 mL Intracatheter Q12H  . vancomycin  500 mg Oral Q6H  . vancomycin  500 mg Intravenous Q24H    Objective: Vital signs in last 24 hours: Temp:  [97.6 F (36.4 C)-98.3 F (36.8 C)] 97.6 F (36.4 C) (05/12 3875) Pulse Rate:  [72-112] 101 (05/12 0910) Resp:  [15-32] 32 (05/12 0910) BP: (114-143)/(62-76) 140/76 mmHg (05/12 0910) SpO2:  [87 %-100 %] 96 % (05/12 0910) FiO2 (%):  [40 %] 40 % (05/12 0910) Weight:  [52.8 kg (116 lb 6.5 oz)] 52.8 kg (116 lb 6.5 oz) (05/12 0500)   General appearance: alert and no distress Resp: diminished breath sounds bilaterally and rhonchi bilaterally Cardio: regular rate and rhythm GI: normal findings: soft, non-tender and abnormal findings:  absent bowel sounds Extremities: edema none, wasting  Lab Results  Recent Labs  12/09/15 2000 12/11/15 0453  WBC  --  13.2*  HGB  --  8.4*  HCT  --  28.7*  NA 150* 155*  K 3.6 3.3*  CL 115* 118*  CO2 22 25  BUN 63* 45*  CREATININE 1.04* 1.05*   Liver Panel  Recent Labs  12/11/15 0453  PROT 5.2*  ALBUMIN 1.2*  AST 11*  ALT 10*  ALKPHOS 78  BILITOT 0.8   Sedimentation Rate No results for input(s): ESRSEDRATE in the last 72 hours. C-Reactive Protein No results for input(s): CRP in the last 72 hours.  Microbiology: Recent Results (from the past 240 hour(s))  Blood Culture (routine x 2)     Status: Abnormal   Collection Time: 12/04/15  7:28 PM  Result Value Ref Range Status   Specimen Description BLOOD PICC LINE  Final   Special Requests BOTTLES DRAWN AEROBIC AND ANAEROBIC 5CC  Final   Culture  Setup Time   Final    Organism ID to follow GRAM POSITIVE COCCI GRAM NEGATIVE RODS IN BOTH AEROBIC AND ANAEROBIC BOTTLES CRITICAL RESULT CALLED TO, READ BACK BY AND VERIFIED WITH: C. STEWART, PHARM D AT 0855 ON [id number] BY Rhea Bleacher    Culture (A)  Final    KLEBSIELLA PNEUMONIAE CARBAPENEMASE RESISTANT ENTEROBACTERIACAE PSEUDOMONAS AERUGINOSA ENTEROCOCCUS  SPECIES RESULT CALLED TO, READ BACK BY AND VERIFIED WITH: DR Maddyx Vallie 12/08/15 @ 61 M VESTAL NOTIFIED M DAVIS W/ INFECTION PREVENTION 12/08/15 @ 54 M VESTAL    Report Status 12/10/2015 FINAL  Final   Organism ID, Bacteria KLEBSIELLA PNEUMONIAE  Final   Organism ID, Bacteria PSEUDOMONAS AERUGINOSA  Final   Organism ID, Bacteria ENTEROCOCCUS SPECIES  Final  Susceptibility   Klebsiella pneumoniae - MIC*    AMPICILLIN >=32 RESISTANT Resistant     CEFAZOLIN >=64 RESISTANT Resistant     CEFEPIME 16 RESISTANT Resistant     CEFTAZIDIME >=64 RESISTANT Resistant     CEFTRIAXONE >=64 RESISTANT Resistant     CIPROFLOXACIN >=4 RESISTANT Resistant     GENTAMICIN >=16 RESISTANT Resistant     IMIPENEM 8 RESISTANT Resistant     TRIMETH/SULFA >=320 RESISTANT Resistant     AMPICILLIN/SULBACTAM >=32 RESISTANT Resistant     PIP/TAZO >=128 RESISTANT Resistant     * KLEBSIELLA PNEUMONIAE   Pseudomonas aeruginosa - MIC*    CEFTAZIDIME >=64 RESISTANT Resistant     CIPROFLOXACIN 2 INTERMEDIATE Intermediate     GENTAMICIN <=1 SENSITIVE Sensitive     IMIPENEM >=16 RESISTANT Resistant     CEFEPIME 32 RESISTANT Resistant     * PSEUDOMONAS AERUGINOSA   Enterococcus species - MIC*    AMPICILLIN 4 RESISTANT Resistant     VANCOMYCIN 4 SENSITIVE Sensitive     GENTAMICIN SYNERGY RESISTANT Resistant     * ENTEROCOCCUS SPECIES  Blood Culture (routine x 2)     Status: Abnormal   Collection Time: 12/04/15  7:28 PM  Result Value Ref Range Status   Specimen Description BLOOD PICC LINE  Final   Special Requests BOTTLES DRAWN AEROBIC AND ANAEROBIC 5CC  Final   Culture  Setup Time   Final    GRAM POSITIVE COCCI GRAM NEGATIVE RODS IN BOTH AEROBIC AND ANAEROBIC BOTTLES CRITICAL RESULT CALLED TO, READ BACK BY AND VERIFIED WITH: C. STEWART, PHARM D AT 0855 ON [id number] BY Rhea Bleacher    Culture (A)  Final    KLEBSIELLA PNEUMONIAE PSEUDOMONAS AERUGINOSA ENTEROCOCCUS SPECIES SUSCEPTIBILITIES PERFORMED ON PREVIOUS  CULTURE WITHIN THE LAST 5 DAYS.    Report Status 12/10/2015 FINAL  Final  Blood Culture ID Panel (Reflexed)     Status: Abnormal   Collection Time: 12/04/15  7:28 PM  Result Value Ref Range Status   Enterococcus species DETECTED (A) NOT DETECTED Final    Comment: CRITICAL RESULT CALLED TO, READ BACK BY AND VERIFIED WITH: C. STEWART, PHARM D AT 0855 ON [id number] BY S. YARBROUGH    Vancomycin resistance NOT DETECTED NOT DETECTED Final   Listeria monocytogenes NOT DETECTED NOT DETECTED Final   Staphylococcus species NOT DETECTED NOT DETECTED Final   Staphylococcus aureus NOT DETECTED NOT DETECTED Final   Methicillin resistance NOT DETECTED NOT DETECTED Final   Streptococcus species NOT DETECTED NOT DETECTED Final   Streptococcus agalactiae NOT DETECTED NOT DETECTED Final   Streptococcus pneumoniae NOT DETECTED NOT DETECTED Final   Streptococcus pyogenes NOT DETECTED NOT DETECTED Final   Acinetobacter baumannii NOT DETECTED NOT DETECTED Final   Enterobacteriaceae species NOT DETECTED NOT DETECTED Final   Enterobacter cloacae complex NOT DETECTED NOT DETECTED Final   Escherichia coli NOT DETECTED NOT DETECTED Final   Klebsiella oxytoca NOT DETECTED NOT DETECTED Final   Klebsiella pneumoniae DETECTED (A) NOT DETECTED Final    Comment: CRITICAL RESULT CALLED TO, READ BACK BY AND VERIFIED WITH: C. STEWART,PHARM D AT 0855 ON [id number] BY S. YARBROUGH    Proteus species NOT DETECTED NOT DETECTED Final   Serratia marcescens NOT DETECTED NOT DETECTED Final   Carbapenem resistance DETECTED (A) NOT DETECTED Final    Comment: CRITICAL RESULT CALLED TO, READ BACK BY AND VERIFIED WITH: C. STEWART,PHARM D AT 0855 ON [id number] BY S. YARBROUGH AND NOTIFIED NATALIE DAVIS IN INFECTION CONTROL AT 0900 ON  [id number] BY S. YARBROUGH    Haemophilus influenzae NOT DETECTED NOT DETECTED Final   Neisseria meningitidis NOT DETECTED NOT DETECTED Final   Pseudomonas aeruginosa DETECTED (A) NOT DETECTED Final    Comment:  CRITICAL RESULT CALLED TO, READ BACK BY AND VERIFIED WITH: C. STEWART,PHARM D AT 0855 ON [id number] BY S. YARBROUGH    Candida albicans NOT DETECTED NOT DETECTED Final   Candida glabrata NOT DETECTED NOT DETECTED Final   Candida krusei NOT DETECTED NOT DETECTED Final   Candida parapsilosis NOT DETECTED NOT DETECTED Final   Candida tropicalis NOT DETECTED NOT DETECTED Final  Urine culture     Status: None   Collection Time: 12/04/15  7:48 PM  Result Value Ref Range Status   Specimen Description URINE, CATHETERIZED  Final   Special Requests NONE  Final   Culture NO GROWTH 1 DAY  Final   Report Status 12/06/2015 FINAL  Final  MRSA PCR Screening     Status: Abnormal   Collection Time: 12/05/15 12:04 AM  Result Value Ref Range Status   MRSA by PCR POSITIVE (A) NEGATIVE Final    Comment:        The GeneXpert MRSA Assay (FDA approved for NASAL specimens only), is one component of a comprehensive MRSA colonization surveillance program. It is not intended to diagnose MRSA infection nor to guide or monitor treatment for MRSA infections. RESULT CALLED TO, READ BACK BY AND VERIFIED WITH: Filbert Berthold RN 10:40 12/05/15 ([email or web address])   Wound culture     Status: None   Collection Time: 12/05/15  7:30 AM  Result Value Ref Range Status   Specimen Description WOUND  Final   Special Requests SACRAL  Final   Gram Stain   Final    FEW WBC PRESENT, PREDOMINANTLY PMN RARE SQUAMOUS EPITHELIAL CELLS PRESENT ABUNDANT GRAM NEGATIVE RODS MODERATE GRAM POSITIVE COCCI IN PAIRS FEW GRAM POSITIVE RODS Performed at [facility]    Culture   Final    ABUNDANT PSEUDOMONAS AERUGINOSA DRUG RESISTANT Note: COLISTIN SENSITIVE 2 ug/mL ETEST results for this drug are "FOR INVESTIGATIONAL USE ONLY" and should NOT be used for clinical purposes. MUTLI CRITICAL RESULT CALLED TO, READ BACK BY AND VERIFIED WITH: CAROLYN (RN)@ 10:58AM ON  12/10/2015 HAJAM Performed at [facility]    Report Status 12/10/2015  FINAL  Final   Organism ID, Bacteria PSEUDOMONAS AERUGINOSA  Final      Susceptibility   Pseudomonas aeruginosa - MIC*    CEFEPIME 32 RESISTANT Resistant     CEFTAZIDIME >=64 RESISTANT Resistant     CIPROFLOXACIN >=4 RESISTANT Resistant     GENTAMICIN <=1 SENSITIVE Sensitive     IMIPENEM >=16 RESISTANT Resistant     TOBRAMYCIN <=1 SENSITIVE Sensitive     * ABUNDANT PSEUDOMONAS AERUGINOSA  C difficile quick scan w PCR reflex     Status: Abnormal   Collection Time: 12/05/15  6:01 PM  Result Value Ref Range Status   C Diff antigen POSITIVE (A) NEGATIVE Final   C Diff toxin POSITIVE (A) NEGATIVE Final   C Diff interpretation Positive for toxigenic C. difficile  Final    Comment: CRITICAL RESULT CALLED TO, READ BACK BY AND VERIFIED WITHBryson Dames RN 1540 12/05/15 A BROWNING   Culture, respiratory (tracheal aspirate)     Status: None   Collection Time: 12/05/15  8:53 PM  Result Value Ref Range Status   Specimen Description TRACHEAL ASPIRATE  Final   Special Requests NONE  Final   Gram  Stain   Final    ABUNDANT WBC PRESENT, PREDOMINANTLY PMN FEW SQUAMOUS EPITHELIAL CELLS PRESENT ABUNDANT GRAM NEGATIVE RODS Performed at [facility]    Culture   Final    MODERATE PSEUDOMONAS AERUGINOSA Performed at [facility]    Report Status 12/08/2015 FINAL  Final   Organism ID, Bacteria PSEUDOMONAS AERUGINOSA  Final      Susceptibility   Pseudomonas aeruginosa - MIC*    CEFEPIME 16 INTERMEDIATE Intermediate     CEFTAZIDIME 32 RESISTANT Resistant     CIPROFLOXACIN 1 SENSITIVE Sensitive     GENTAMICIN <=1 SENSITIVE Sensitive     IMIPENEM >=16 RESISTANT Resistant     TOBRAMYCIN <=1 SENSITIVE Sensitive     * MODERATE PSEUDOMONAS AERUGINOSA  Culture, blood (Routine X 2) w Reflex to ID Panel     Status: Abnormal   Collection Time: 12/09/15 12:00 PM  Result Value Ref Range Status   Specimen Description BLOOD RIGHT ARM  Final   Special Requests IN PEDIATRIC BOTTLE 3CC  Final    Culture  Setup Time   Final    GRAM POSITIVE COCCI IN CLUSTERS IN PEDIATRIC BOTTLE CRITICAL RESULT CALLED TO, READ BACK BY AND VERIFIED WITH: C WHITE,RN A T 0904 12/10/15 BY L BENFIELD    Culture (A)  Final    STAPHYLOCOCCUS SPECIES (COAGULASE NEGATIVE) THE SIGNIFICANCE OF ISOLATING THIS ORGANISM FROM A SINGLE SET OF BLOOD CULTURES WHEN MULTIPLE SETS ARE DRAWN IS UNCERTAIN. PLEASE NOTIFY THE MICROBIOLOGY DEPARTMENT WITHIN ONE WEEK IF SPECIATION AND SENSITIVITIES ARE REQUIRED.    Report Status 12/12/2015 FINAL  Final  Culture, blood (routine x 2)     Status: None (Preliminary result)   Collection Time: 12/09/15 12:20 PM  Result Value Ref Range Status   Specimen Description BLOOD RIGHT HAND  Final   Special Requests IN PEDIATRIC BOTTLE 3CC  Final   Culture NO GROWTH 2 DAYS  Final   Report Status PENDING  Incomplete  Culture, blood (routine x 2)     Status: None (Preliminary result)   Collection Time: 12/09/15  3:45 PM  Result Value Ref Range Status   Specimen Description BLOOD LEFT ARM  Final   Special Requests IN PEDIATRIC BOTTLE 1.5CC  Final   Culture NO GROWTH 2 DAYS  Final   Report Status PENDING  Incomplete    Studies/Results: No results found.   Assessment/Plan: Polymicrobial BCx- pseudomonas, klebsiella (KPC), enterococcus (non-VRE).  GPC 1/3 (5-9) CoAg Neg Staph HCAP- MDR pseudomonas (s- flq, aminoglycosides) Sepsis Stage IV sacral decubitus Nutrition, PEG displacement (now out) CRI Recurrent C diff End of Life  Total days of antibiotics: 8 avycaz, flagyl, vanco 5-9 Gent  BCx is contaminant Cr improved. WBC improved. Na worsening Would stop gent at day 7 (5-15) Continue avycaz (for pseudo and kleb), vanco (for enterococcous R-amp) for 2 weeks total (stop 5-18) Would give her prolonged po vanco taper, subtracting days she is on flagyl from this.  2 weeks 125 mg, qid 1 week '125mg'$  bid 1 week '125mg'$  qday 2 weeks '125mg'$  qod  available as needed         Bobby Rumpf Infectious Diseases (pager) [phone] [email or web address] 12/12/2015, 9:12 AM  LOS: 8 days

## 2015-12-12 NOTE — Progress Notes (Signed)
Pt tolerated trach collar for an hour before becoming tachypneic with increased WOB using accessory muscles. Placed back to vent, RN aware.

## 2015-12-12 NOTE — Care Management Important Message (Signed)
Important Message  Patient Details  Name: Kristen ChessJudy Arnold MRN: [id number] Date of Birth: 05/29/1946   Medicare Important Message Given:  Yes    Kerrion Kemppainen, Chapman FitchHenrietta T, RN 12/12/2015, 11:52 AM

## 2015-12-12 NOTE — Progress Notes (Signed)
PULMONARY / CRITICAL CARE MEDICINE   Name: Kristen ChessJudy Arnold MRN: [id number] DOB: 04/18/1946    ADMISSION DATE:  12/04/2015 CONSULTATION DATE:  12/04/15  REFERRING MD:  EDP  CHIEF COMPLAINT:  AMS  70 year old woman transferred from [facility] due to being unresponsive for 24 hours. She was noted to be hypotensive with a hemoglobin of 6.1, chest x-ray showed a left lower lobe airspace disease. She had right hemicolectomy for small bowel obstruction in December 2016 and required prolonged mechanical ventilation and tracheostomy/ PEG placement. She was hospitalized in 09/2015 for aspiration pneumonia-blood cultures positive for coag-negative staph and Pseudomonas-related to pneumonia/ HCAP. she also has a history of recurrent Clostridium difficile diarrhea and severe protein calorie malnutrition    Subjective- 5/12: Currently on aerosol trach collar, tolerating well.Alert, following commands and nodding appropriately. afebrile  VITAL SIGNS: BP 134/73 mmHg  Pulse 72  Temp(Src) 98.3 F (36.8 C) (Axillary)  Resp 24  Ht 5' (1.524 m)  Wt 116 lb 6.5 oz (52.8 kg)  BMI 22.73 kg/m2  SpO2 98%  HEMODYNAMICS:    VENTILATOR SETTINGS: Vent Mode:  [-] PRVC FiO2 (%):  [40 %] 40 % Set Rate:  [16 bmp] 16 bmp Vt Set:  [480 mL] 480 mL PEEP:  [5 cmH20] 5 cmH20 Plateau Pressure:  [14 cmH20-23 cmH20] 21 cmH20  INTAKE / OUTPUT: I/O last 3 completed shifts: In: 2949.3 [I.V.:1799.3; IV Piggyback:1150] Out: 1350 [Urine:1125; Other:225]   PHYSICAL EXAMINATION:  General: Chronically ill appearing elderly female, cachectic, in NAD,alert, on trach collar. Neuro: opens eyes spont,  Following commands, contracted uppers chronic. Attempting to speak HEENT: [place]/AT. PERRL, trach clean Cardiovascular: s1 s2 RRR  Lungs: coarse throughout Abdomen: BS x 4, soft, NT/ND. PEG out, pouch to site with bilious drainage. Musculoskeletal: No gross deformities, no edema.  Skin: Intact, warm, no  rashes.   LABS:  BMET  Recent Labs Lab 12/06/15 0533 12/09/15 2000 12/11/15 0453  NA 141 150* 155*  K 4.1 3.6 3.3*  CL 95* 115* 118*  CO2 33* 22 25  BUN 141* 63* 45*  CREATININE 1.38* 1.04* 1.05*  GLUCOSE 184* 123* 132*    Electrolytes  Recent Labs Lab 12/06/15 0533 12/09/15 2000 12/11/15 0453 12/11/15 1140  CALCIUM 7.5* 7.6* 7.9*  --   MG  --   --   --  1.4*    CBC  Recent Labs Lab 12/06/15 0533 12/11/15 0453  WBC 35.1* 13.2*  HGB 8.7* 8.4*  HCT 26.3* 28.7*  PLT 596* 579*    Coag's No results for input(s): APTT, INR in the last 168 hours.  Sepsis Markers  Recent Labs Lab 12/05/15 1110  LATICACIDVEN 2.1*    ABG No results for input(s): PHART, PCO2ART, PO2ART in the last 168 hours.  Liver Enzymes  Recent Labs Lab 12/06/15 0533 12/11/15 0453  AST 21 11*  ALT 15 10*  ALKPHOS 100 78  BILITOT 0.5 0.8  ALBUMIN 1.1* 1.2*    Cardiac Enzymes  Recent Labs Lab 12/05/15 1110  TROPONINI 0.18*    Glucose  Recent Labs Lab 12/11/15 0853 12/11/15 1324 12/11/15 1722 12/11/15 2018 12/12/15 0105 12/12/15 0323  GLUCAP 135* 128* 135* 111* 107* 126*    Imaging No results found. STUDIES:  CXR 05/04 > left HCAP  CULTURES: Blood reflex 05/04 >> klebsiella pneumonia, enterococcus, pseudomonas aeruginosa and carbapenem resistance BCx2 5/4 >> gram neg rod>>> KLEBSIELLA PNEUMONIAE  PSEUDOMONAS AERUGINOSA  ENTEROCOCCUS SPECIES MDRO Urine 05/04 >> Sputum 05/04 >>pseudomons aur  ANTIBIOTICS: Vanc 05/04 >> Oral Vanc  05/04>>05/08 ( PEG d/c'd) Zosyn 05/04 >>off Cipro 05/04 >>off Flagyl 5/5>> Ceftaz/ avibactam 5/5 >>  SIGNIFICANT EVENTS: 5/04  Admitted with septic shock presumed due to HCAP as well as anemia (Hgb 6.1) 5/6- collapse, peep, dnr 5/7- some improved aeration left 5/9 to SDU  LINES/TUBES: Trach (chronic) >> J tube (chronic) >> removed  L PICC (chronic) >> will remove 05/04 as it is leaking. CVL L IJ 5/04  >>  DISCUSSION: 70 y.o. F with chronic trach / J tube from [facility], admitted 05/04 with septic shock and anemia.DNR status established  5/6, no escalation of care.   ASSESSMENT / PLAN:  Chronic tracheostomy / vent dependence. LLL HCAP. COPD without acute exacerbation New collapse / effusion / ATX left - PNA --Daily SBT with goal ATC - Intermittent CXR   Septic shock - > resolved -Will not re-initiaite pressors  Septic shock - presumed due to HCAP.  Hx pseudomonal and coag neg staph bacteremia last admission in Feb Bacteremia klebsiella, pseudomonas, enterococcus MDRO Hx C.diff. PNA left withj collapse, pseudomonas Not receiving PO Vanc 2/2 PEG removal( ID aware) - ID following  She is awake following commands She tolerated limited ATC trials   Revisit goals of care as pt is awake, following commands  And weaning. Would suggest re insert PEJ & tr back to [facility] with DNR/ no escalation vs hospice   Bevelyn Ngo, AGACNP-BC [facility] Pulmonary/Critical Care Medicine Pager [phone]  12/12/2015 8:34 AM

## 2015-12-12 NOTE — Progress Notes (Signed)
Pt's son Harvie HeckRandy called for update on his mom's condition. Told of upcoming feeding tube placement later today. Says he would not be totally opposed to replacing a G or J tube placed back in her stomach but wants to try the nasal route first. Than said - whatever would be best for his mom and would leave it up to the DR. Told son that if we can't get nasal tube placed for feeding than we would need his consent for the other tube to be replaced. Have a working cell # for him at [phone]

## 2015-12-12 NOTE — Progress Notes (Signed)
Nutrition Follow Up  DOCUMENTATION CODES:   Severe malnutrition in context of chronic illness  INTERVENTION:    Once NGT placed, initiate Jevity 1.2 formula at 20 ml/hr and increase by 10 ml every 8 hours to goal rate of 40 ml/hr  Prostat liquid protein po 30 ml BID    Liquid MVI daily   Total TF regimen to provide 1352 kcals, 83 gm protein, 775 ml of free water  NUTRITION DIAGNOSIS:  Inadequate oral intake related to inability to eat as evidenced by NPO status, ongoing  GOAL:   Patient will meet greater than or equal to 90% of their needs, currently unmet  MONITOR:   Vent status, Labs, Weight trends, Skin, I & O's, TF tolerance  ASSESSMENT:   70 year old woman transferred from [facility] due to being unresponsive for 24 hours. She was noted to be hypotensive with a hemoglobin of 6.1, chest x-ray showed a left lower lobe airspace disease.   Patient is currently on ventilator support -- trach MV: 10.4 L/min Temp (24hrs), Avg:97.9 F (36.6 C), Min:97.6 F (36.4 C), Max:98.3 F (36.8 C)   G-tube removed >> had green drainage leaking >> TF discontinued 5/8. Pt continues on ATC trials >> tolerating a few hours at a time. [facility] team unsuccessful at small bore feeding tube placement 5/11 IR consulted for NGT placement. Palliative Care Team & ID following.  Diet Order:  Diet NPO time specified  Skin:  Wound (see comment) (PU stage 4 on sacrum)  Last BM:  5/8  Height:   Ht Readings from Last 1 Encounters:  12/04/15 5' (1.524 m)    Weight:   Wt Readings from Last 1 Encounters:  12/12/15 116 lb 6.5 oz (52.8 kg)    Ideal Body Weight:  45.4 kg  BMI:  Body mass index is 22.73 kg/(m^2).  Estimated Nutritional Needs:   Kcal:  1340  Protein:  80-90 gm  Fluid:  per MD  EDUCATION NEEDS:   No education needs identified at this time  Maureen ChattersKatie Aundra Espin, RD, LDN Pager #: [phone] After-Hours Pager #: [phone]

## 2015-12-13 ENCOUNTER — Encounter (HOSPITAL_COMMUNITY): Payer: Self-pay | Admitting: Radiology

## 2015-12-13 ENCOUNTER — Inpatient Hospital Stay (HOSPITAL_COMMUNITY): Payer: Medicare Other

## 2015-12-13 LAB — COMPREHENSIVE METABOLIC PANEL
ALBUMIN: 1.3 g/dL — AB (ref 3.5–5.0)
ALT: 7 U/L — ABNORMAL LOW (ref 14–54)
AST: 10 U/L — AB (ref 15–41)
Alkaline Phosphatase: 78 U/L (ref 38–126)
Anion gap: 7 (ref 5–15)
BUN: 26 mg/dL — AB (ref 6–20)
CHLORIDE: 115 mmol/L — AB (ref 101–111)
CO2: 24 mmol/L (ref 22–32)
Calcium: 7.5 mg/dL — ABNORMAL LOW (ref 8.9–10.3)
Creatinine, Ser: 0.83 mg/dL (ref 0.44–1.00)
GFR calc Af Amer: >60 mL/min (ref >60)
Glucose, Bld: 132 mg/dL — ABNORMAL HIGH (ref 65–99)
POTASSIUM: 3.9 mmol/L (ref 3.5–5.1)
SODIUM: 146 mmol/L — AB (ref 135–145)
Total Bilirubin: 0.6 mg/dL (ref 0.3–1.2)
Total Protein: 5.7 g/dL — ABNORMAL LOW (ref 6.5–8.1)

## 2015-12-13 LAB — CBC
HEMATOCRIT: 30.4 % — AB (ref 36.0–46.0)
Hemoglobin: 9.4 g/dL — ABNORMAL LOW (ref 12.0–15.0)
MCH: 28 pg (ref 26.0–34.0)
MCHC: 30.9 g/dL (ref 30.0–36.0)
MCV: 90.5 fL (ref 78.0–100.0)
Platelets: 460 10*3/uL — ABNORMAL HIGH (ref 150–400)
RBC: 3.36 MIL/uL — ABNORMAL LOW (ref 3.87–5.11)
RDW: 19.8 % — AB (ref 11.5–15.5)
WBC: 14.1 10*3/uL — AB (ref 4.0–10.5)

## 2015-12-13 LAB — GLUCOSE, CAPILLARY
GLUCOSE-CAPILLARY: 87 mg/dL (ref 65–99)
GLUCOSE-CAPILLARY: 93 mg/dL (ref 65–99)
Glucose-Capillary: 106 mg/dL — ABNORMAL HIGH (ref 65–99)
Glucose-Capillary: 110 mg/dL — ABNORMAL HIGH (ref 65–99)
Glucose-Capillary: 123 mg/dL — ABNORMAL HIGH (ref 65–99)
Glucose-Capillary: 76 mg/dL (ref 65–99)
Glucose-Capillary: 90 mg/dL (ref 65–99)

## 2015-12-13 LAB — GENTAMICIN LEVEL, TROUGH: GENTAMICIN TR: 2.8 ug/mL — AB (ref 0.5–2.0)

## 2015-12-13 MED ORDER — GENTAMICIN IN SALINE 1.2-0.9 MG/ML-% IV SOLN
60.0000 mg | INTRAVENOUS | Status: AC
Start: 2015-12-13 — End: 2015-12-15
  Administered 2015-12-13 – 2015-12-15 (×3): 60 mg via INTRAVENOUS
  Filled 2015-12-13 (×4): qty 50

## 2015-12-13 NOTE — Progress Notes (Signed)
Pharmacy Antibiotic Note  Kristen Arnold is a 70 y.o. female admitted on 12/04/2015 with pneumonia and bacteremia.  Pharmacy has been consulted for Vancomycin and Gentamicin dosing, pt is also on Avycaz. The patient has multiple drug resistant infections including MDR Pseudomonas, CRKP, etc.   Vancomycin trough tonight is slightly elevated at 21 (goal 15-20) Gentamicin trough is also elevated at 2.8 (goal <2)  Plan: -Change vancomycin to 750 mg q36h (to to stop on 5/18 per ID note) -Change gentamicin to 60 mg q24h (to stop on 5/15 per ID note) -Re-check levels as indicated   Height: 5' (152.4 cm) Weight: 116 lb 6.5 oz (52.8 kg) IBW/kg (Calculated) : 45.5  Temp (24hrs), Avg:98 F (36.7 C), Min:97.6 F (36.4 C), Max:98.3 F (36.8 C)   Recent Labs Lab 12/06/15 0533 12/09/15 0255 12/09/15 2000 12/10/15 0316 12/11/15 0453 12/12/15 2130 12/12/15 2237  WBC 35.1*  --   --   --  13.2*  --   --   CREATININE 1.38*  --  1.04*  --  1.05*  --   --   VANCOTROUGH  --   --  20  --   --  21*  --   GENTTROUGH  --   --   --   --   --   --  2.8*  GENTRANDOM  --  11.6  --  4.3  --   --   --     Estimated Creatinine Clearance: 35.8 mL/min (by C-G formula based on Cr of 1.05).    Allergies  Allergen Reactions  . Codeine     [facility]    Abran DukeLedford, Kristen Arnold 12/13/2015 12:13 AM

## 2015-12-13 NOTE — Progress Notes (Signed)
Son Conrad BurlingtonRandy Derr called and gave phone consent for pt to get Gtube replaced. Signed and in chart

## 2015-12-13 NOTE — Progress Notes (Addendum)
[place] TEAM 1 - Stepdown/ICU TEAM  Annick Dimaio  JYN:[id number] DOB: 30-Mar-1946 DOA: 12/04/2015 PCP: Verneita Griffes, MD    Brief Narrative:  70 y.o. 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 She had a hospitalization to [facility] ICU 09/26/15 through 10/01/15 for aspiration PNA. During that admission she failed a swallow eval so her chronic G tube was exchanged for J tube by IR. Blood cultures that admission were noteable for coag neg staph and pseudomonas aeruginosa, sputum cultures were also noteable for pseudomonas (sputum resistant to both imipenem and zosyn; sputum and blood were both only sensitive to cipro). Due to her hx of C.diff, she was started on oral vanc per recs from ID.  Following her course of abx, she was discharged back to [facility].  On 05/04, she was brought to [facility] ED due to reports of AMS w/ minimal responsiveness x 24 hours. In the ED she was found to have septic shock presumed due to LLL HCAP as well as anemia with Hgb 6.1. She was started on levophed and 2u PRBC were ordered.  Significant Events: 5/4 Admitted with septic shock presumed due to HCAP as well as anemia (Hgb 6.1) 5/6 L lung collapse, peep, dnr 5/7 some improved aeration left 5/12 failed attempts to place NG feeding tube   Assessment & Plan:  Chronic tracheostomy / vent dependence Care per PCCM - attempts at weaning of late have been unsuccessful   LLL HCAP w/ septic shock  Required levophed earlier in hospital stay - Hx pseudomonal and coag neg staph bacteremia Feb - sepsis physiology has resolved   Polymicrobial bacteremia with Enterococcus, CRE klebsiella, pseudomonas  abx continue per ID suggestions   LLL collapse  Care per PCCM - ongoing chest PT - increased PEEP   C diff colitis On flagyl per ID    COPD No evidence of acute exacerbation   Anemia - acute on chronic Hgb presently stable     Recent Labs Lab 12/11/15 0453 12/13/15 0315  HGB 8.4* 9.4*   Hx DVT  Sacral decubitus ulcer stage IV Care as per WOC Team  Altered mental status  likely due to sepsis and severe uremia - mental status much improved today w/ pt appearing to be oriented   Hx anxiety / depression + chronic pain Calm at time of exam today   AKI - in setting of shock crt has normalized    Recent Labs Lab 12/09/15 2000 12/11/15 0453 12/13/15 0315  CREATININE 1.04* 1.05* 0.83    Severe malnutrition in context of chronic illness Presently has no means of ongoing nutritional support (J tube removed this hospitalization as it was not working) - attempts to place NG tube 5/12 failed despite fluoroscopy - IR to replace PEG tube as soon as family can be found to give consent   DM CBG stable   MRSA screen +   DVT prophylaxis: SQ heparin  Code Status: NO CODE BLUE - DNR  Family Communication: no family present at time of exam  Disposition Plan: SDU - return to vent SNF once PEG tube replaced    Consultants:  ID PCCM Palliative Care   Procedures:  8/65 R basilic PICC  Antimicrobials:  Per ID  Subjective: The patient is alert and calm.  She denies any complaints.   Objective: Blood pressure 139/69, pulse 66, temperature 97.7 F (36.5 C), temperature source Oral, resp. rate 22, height 5' (1.524 m),  weight 55.7 kg (122 lb 12.7 oz), SpO2 99 %.  Intake/Output Summary (Last 24 hours) at 12/13/15 1533 Last data filed at 12/13/15 0900  Gross per 24 hour  Intake 977.08 ml  Output    670 ml  Net 307.08 ml   Filed Weights   12/11/15 0341 12/12/15 0500 12/13/15 0540  Weight: 53.5 kg (117 lb 15.1 oz) 52.8 kg (116 lb 6.5 oz) 55.7 kg (122 lb 12.7 oz)    Examination: General: No acute respiratory distress on vent  Lungs: poor air movement B bases w/o wheeze  Cardiovascular: Regular without murmur Abdomen: Nontender, soft, bowel sounds positive, no rebound Extremities: No significant  cyanosis, clubbing, or edema bilateral lower extremities   CBC:  Recent Labs Lab 12/11/15 0453 12/13/15 0315  WBC 13.2* 14.1*  HGB 8.4* 9.4*  HCT 28.7* 30.4*  MCV 90.5 90.5  PLT 579* 545*   Basic Metabolic Panel:  Recent Labs Lab 12/09/15 2000 12/11/15 0453 12/11/15 1140 12/13/15 0315  NA 150* 155*  --  146*  K 3.6 3.3*  --  3.9  CL 115* 118*  --  115*  CO2 22 25  --  24  GLUCOSE 123* 132*  --  132*  BUN 63* 45*  --  26*  CREATININE 1.04* 1.05*  --  0.83  CALCIUM 7.6* 7.9*  --  7.5*  MG  --   --  1.4*  --    GFR: Estimated Creatinine Clearance: 49.4 mL/min (by C-G formula based on Cr of 0.83).   Liver Function Tests:  Recent Labs Lab 12/11/15 0453 12/13/15 0315  AST 11* 10*  ALT 10* 7*  ALKPHOS 78 78  BILITOT 0.8 0.6  PROT 5.2* 5.7*  ALBUMIN 1.2* 1.3*   CBG:  Recent Labs Lab 12/12/15 2002 12/12/15 2342 12/13/15 0304 12/13/15 0858 12/13/15 1211  GLUCAP 128* 90 123* 76 87    Recent Results (from the past 240 hour(s))  Blood Culture (routine x 2)     Status: Abnormal (Preliminary result)   Collection Time: 12/04/15  7:28 PM  Result Value Ref Range Status   Specimen Description BLOOD PICC LINE  Final   Special Requests BOTTLES DRAWN AEROBIC AND ANAEROBIC 5CC  Final   Culture  Setup Time   Final    Organism ID to follow GRAM POSITIVE COCCI GRAM NEGATIVE RODS IN BOTH AEROBIC AND ANAEROBIC BOTTLES CRITICAL RESULT CALLED TO, READ BACK BY AND VERIFIED WITH: C. STEWART, PHARM D AT 0855 ON [id number] BY S. YARBROUGH    Culture (A)  Final    KLEBSIELLA PNEUMONIAE CARBAPENEMASE RESISTANT ENTEROBACTERIACAE PSEUDOMONAS AERUGINOSA ENTEROCOCCUS SPECIES RESULT CALLED TO, READ BACK BY AND VERIFIED WITH: DR HATCHER 12/08/15 @ 43 M VESTAL NOTIFIED M DAVIS W/ INFECTION PREVENTION 12/08/15 @ [place] FOR KLEBSIELLA AND PSEUDOMONAS    Report Status PENDING  Incomplete   Organism ID, Bacteria KLEBSIELLA PNEUMONIAE  Final    Organism ID, Bacteria PSEUDOMONAS AERUGINOSA  Final   Organism ID, Bacteria ENTEROCOCCUS SPECIES  Final      Susceptibility   Klebsiella pneumoniae - MIC*    AMPICILLIN >=32 RESISTANT Resistant     CEFAZOLIN >=64 RESISTANT Resistant     CEFEPIME 16 RESISTANT Resistant     CEFTAZIDIME >=64 RESISTANT Resistant     CEFTRIAXONE >=64 RESISTANT Resistant     CIPROFLOXACIN >=4 RESISTANT Resistant     GENTAMICIN >=16 RESISTANT Resistant     IMIPENEM 8 RESISTANT Resistant     TRIMETH/SULFA >=  320 RESISTANT Resistant     AMPICILLIN/SULBACTAM >=32 RESISTANT Resistant     PIP/TAZO >=128 RESISTANT Resistant     * KLEBSIELLA PNEUMONIAE   Pseudomonas aeruginosa - MIC*    CEFTAZIDIME >=64 RESISTANT Resistant     CIPROFLOXACIN 2 INTERMEDIATE Intermediate     GENTAMICIN <=1 SENSITIVE Sensitive     IMIPENEM >=16 RESISTANT Resistant     CEFEPIME 32 RESISTANT Resistant     * PSEUDOMONAS AERUGINOSA   Enterococcus species - MIC*    AMPICILLIN 4 RESISTANT Resistant     VANCOMYCIN 4 SENSITIVE Sensitive     GENTAMICIN SYNERGY RESISTANT Resistant     * ENTEROCOCCUS SPECIES  Blood Culture (routine x 2)     Status: Abnormal   Collection Time: 12/04/15  7:28 PM  Result Value Ref Range Status   Specimen Description BLOOD PICC LINE  Final   Special Requests BOTTLES DRAWN AEROBIC AND ANAEROBIC 5CC  Final   Culture  Setup Time   Final    GRAM POSITIVE COCCI GRAM NEGATIVE RODS IN BOTH AEROBIC AND ANAEROBIC BOTTLES CRITICAL RESULT CALLED TO, READ BACK BY AND VERIFIED WITH: C. STEWART, PHARM D AT 0855 ON [id number] BY Rhea Bleacher    Culture (A)  Final    KLEBSIELLA PNEUMONIAE PSEUDOMONAS AERUGINOSA ENTEROCOCCUS SPECIES SUSCEPTIBILITIES PERFORMED ON PREVIOUS CULTURE WITHIN THE LAST 5 DAYS.    Report Status 12/10/2015 FINAL  Final  Blood Culture ID Panel (Reflexed)     Status: Abnormal   Collection Time: 12/04/15  7:28 PM  Result Value Ref Range Status   Enterococcus species DETECTED (A) NOT DETECTED Final     Comment: CRITICAL RESULT CALLED TO, READ BACK BY AND VERIFIED WITH: C. STEWART, PHARM D AT 0855 ON [id number] BY S. YARBROUGH    Vancomycin resistance NOT DETECTED NOT DETECTED Final   Listeria monocytogenes NOT DETECTED NOT DETECTED Final   Staphylococcus species NOT DETECTED NOT DETECTED Final   Staphylococcus aureus NOT DETECTED NOT DETECTED Final   Methicillin resistance NOT DETECTED NOT DETECTED Final   Streptococcus species NOT DETECTED NOT DETECTED Final   Streptococcus agalactiae NOT DETECTED NOT DETECTED Final   Streptococcus pneumoniae NOT DETECTED NOT DETECTED Final   Streptococcus pyogenes NOT DETECTED NOT DETECTED Final   Acinetobacter baumannii NOT DETECTED NOT DETECTED Final   Enterobacteriaceae species NOT DETECTED NOT DETECTED Final   Enterobacter cloacae complex NOT DETECTED NOT DETECTED Final   Escherichia coli NOT DETECTED NOT DETECTED Final   Klebsiella oxytoca NOT DETECTED NOT DETECTED Final   Klebsiella pneumoniae DETECTED (A) NOT DETECTED Final    Comment: CRITICAL RESULT CALLED TO, READ BACK BY AND VERIFIED WITH: C. STEWART,PHARM D AT 0855 ON [id number] BY S. YARBROUGH    Proteus species NOT DETECTED NOT DETECTED Final   Serratia marcescens NOT DETECTED NOT DETECTED Final   Carbapenem resistance DETECTED (A) NOT DETECTED Final    Comment: CRITICAL RESULT CALLED TO, READ BACK BY AND VERIFIED WITH: C. STEWART,PHARM D AT 0855 ON [id number] BY S. YARBROUGH AND NOTIFIED NATALIE DAVIS IN INFECTION CONTROL AT 0900 ON [id number] BY S. YARBROUGH    Haemophilus influenzae NOT DETECTED NOT DETECTED Final   Neisseria meningitidis NOT DETECTED NOT DETECTED Final   Pseudomonas aeruginosa DETECTED (A) NOT DETECTED Final    Comment: CRITICAL RESULT CALLED TO, READ BACK BY AND VERIFIED WITH: C. STEWART,PHARM D AT 0855 ON [id number] BY S. YARBROUGH    Candida albicans NOT DETECTED NOT DETECTED Final   Candida glabrata NOT DETECTED NOT DETECTED Final  Candida krusei NOT DETECTED NOT DETECTED  Final   Candida parapsilosis NOT DETECTED NOT DETECTED Final   Candida tropicalis NOT DETECTED NOT DETECTED Final  Urine culture     Status: None   Collection Time: 12/04/15  7:48 PM  Result Value Ref Range Status   Specimen Description URINE, CATHETERIZED  Final   Special Requests NONE  Final   Culture NO GROWTH 1 DAY  Final   Report Status 12/06/2015 FINAL  Final  MRSA PCR Screening     Status: Abnormal   Collection Time: 12/05/15 12:04 AM  Result Value Ref Range Status   MRSA by PCR POSITIVE (A) NEGATIVE Final    Comment:        The GeneXpert MRSA Assay (FDA approved for NASAL specimens only), is one component of a comprehensive MRSA colonization surveillance program. It is not intended to diagnose MRSA infection nor to guide or monitor treatment for MRSA infections. RESULT CALLED TO, READ BACK BY AND VERIFIED WITH: Filbert Berthold RN 10:40 12/05/15 ([email or web address])   Wound culture     Status: None   Collection Time: 12/05/15  7:30 AM  Result Value Ref Range Status   Specimen Description WOUND  Final   Special Requests SACRAL  Final   Gram Stain   Final    FEW WBC PRESENT, PREDOMINANTLY PMN RARE SQUAMOUS EPITHELIAL CELLS PRESENT ABUNDANT GRAM NEGATIVE RODS MODERATE GRAM POSITIVE COCCI IN PAIRS FEW GRAM POSITIVE RODS Performed at [facility]    Culture   Final    ABUNDANT PSEUDOMONAS AERUGINOSA DRUG RESISTANT Note: COLISTIN SENSITIVE 2 ug/mL ETEST results for this drug are "FOR INVESTIGATIONAL USE ONLY" and should NOT be used for clinical purposes. MUTLI CRITICAL RESULT CALLED TO, READ BACK BY AND VERIFIED WITH: CAROLYN (RN)@ 10:58AM ON  12/10/2015 HAJAM Performed at [facility]    Report Status 12/10/2015 FINAL  Final   Organism ID, Bacteria PSEUDOMONAS AERUGINOSA  Final      Susceptibility   Pseudomonas aeruginosa - MIC*    CEFEPIME 32 RESISTANT Resistant     CEFTAZIDIME >=64 RESISTANT Resistant     CIPROFLOXACIN >=4 RESISTANT Resistant     GENTAMICIN <=1  SENSITIVE Sensitive     IMIPENEM >=16 RESISTANT Resistant     TOBRAMYCIN <=1 SENSITIVE Sensitive     * ABUNDANT PSEUDOMONAS AERUGINOSA  C difficile quick scan w PCR reflex     Status: Abnormal   Collection Time: 12/05/15  6:01 PM  Result Value Ref Range Status   C Diff antigen POSITIVE (A) NEGATIVE Final   C Diff toxin POSITIVE (A) NEGATIVE Final   C Diff interpretation Positive for toxigenic C. difficile  Final    Comment: CRITICAL RESULT CALLED TO, READ BACK BY AND VERIFIED WITH: Bryson Dames RN [id number] 12/05/15 A BROWNING   Culture, respiratory (tracheal aspirate)     Status: None   Collection Time: 12/05/15  8:53 PM  Result Value Ref Range Status   Specimen Description TRACHEAL ASPIRATE  Final   Special Requests NONE  Final   Gram Stain   Final    ABUNDANT WBC PRESENT, PREDOMINANTLY PMN FEW SQUAMOUS EPITHELIAL CELLS PRESENT ABUNDANT GRAM NEGATIVE RODS Performed at [facility]    Culture   Final    MODERATE PSEUDOMONAS AERUGINOSA Performed at [facility]    Report Status 12/08/2015 FINAL  Final   Organism ID, Bacteria PSEUDOMONAS AERUGINOSA  Final      Susceptibility   Pseudomonas aeruginosa - MIC*  CEFEPIME 16 INTERMEDIATE Intermediate     CEFTAZIDIME 32 RESISTANT Resistant     CIPROFLOXACIN 1 SENSITIVE Sensitive     GENTAMICIN <=1 SENSITIVE Sensitive     IMIPENEM >=16 RESISTANT Resistant     TOBRAMYCIN <=1 SENSITIVE Sensitive     * MODERATE PSEUDOMONAS AERUGINOSA  Culture, blood (Routine X 2) w Reflex to ID Panel     Status: Abnormal   Collection Time: 12/09/15 12:00 PM  Result Value Ref Range Status   Specimen Description BLOOD RIGHT ARM  Final   Special Requests IN PEDIATRIC BOTTLE 3CC  Final   Culture  Setup Time   Final    GRAM POSITIVE COCCI IN CLUSTERS IN PEDIATRIC BOTTLE CRITICAL RESULT CALLED TO, READ BACK BY AND VERIFIED WITH: C Fairfax A T 0904 12/10/15 BY L BENFIELD    Culture (A)  Final    STAPHYLOCOCCUS SPECIES (COAGULASE NEGATIVE) THE  SIGNIFICANCE OF ISOLATING THIS ORGANISM FROM A SINGLE SET OF BLOOD CULTURES WHEN MULTIPLE SETS ARE DRAWN IS UNCERTAIN. PLEASE NOTIFY THE MICROBIOLOGY DEPARTMENT WITHIN ONE WEEK IF SPECIATION AND SENSITIVITIES ARE REQUIRED.    Report Status 12/12/2015 FINAL  Final  Culture, blood (routine x 2)     Status: None (Preliminary result)   Collection Time: 12/09/15 12:20 PM  Result Value Ref Range Status   Specimen Description BLOOD RIGHT HAND  Final   Special Requests IN PEDIATRIC BOTTLE 3CC  Final   Culture NO GROWTH 4 DAYS  Final   Report Status PENDING  Incomplete  Culture, blood (routine x 2)     Status: None (Preliminary result)   Collection Time: 12/09/15  3:45 PM  Result Value Ref Range Status   Specimen Description BLOOD LEFT ARM  Final   Special Requests IN PEDIATRIC BOTTLE 1.5CC  Final   Culture NO GROWTH 4 DAYS  Final   Report Status PENDING  Incomplete      Scheduled Meds: . antiseptic oral rinse  7 mL Mouth Rinse QID  . ceftazidime avibactam (AVYCAZ) IVPB  1.25 g Intravenous Q8H  . chlorhexidine gluconate (SAGE KIT)  15 mL Mouth Rinse BID  . collagenase   Topical Daily  . famotidine (PEPCID) IV  20 mg Intravenous Q12H  . gentamicin  60 mg Intravenous Q24H  . heparin  5,000 Units Subcutaneous Q8H  . insulin aspart  0-9 Units Subcutaneous Q4H  . metronidazole  500 mg Intravenous Q8H  . sodium chloride flush  10-40 mL Intracatheter Q12H  . vancomycin  500 mg Oral Q6H  . vancomycin  750 mg Intravenous Q36H   Continuous Infusions: . dextrose 5 % with kcl 50 mL/hr at 12/13/15 1221     LOS: 9 days    Time spent: 15 minutes   Cherene Altes, MD [facility] Office  [phone] Pager - Text Page per Shea Evans as per below:  On-Call/Text Page:      [email or web address]      password [email or web address]  If 7PM-7AM, please contact night-coverage [email or web address] Password [facility] 12/13/2015, 3:33 PM

## 2015-12-13 NOTE — Progress Notes (Signed)
Chief Complaint: Patient was seen in consultation today for gastrostomy tube replacement at the request of Dr. Malen Gauze  Referring Physician(s): Dr. Malen Gauze  Supervising Physician: Daryll Brod  Patient Status: In-pt   History of Present Illness: Kristen Arnold is a 70 y.o. female with complex medical hx and illness. She is known to this IR service with a previously placed G-tube at an outside facility. This IR team converted this to a J-tube back in 10/2015 for TF. In the past few weeks, the pts overall status had deteriorated and the pt was made DNR. Apparently the feeding tube was not functional and with her status leaning toward comfort measures the tube was removed. However, she has now had clinical improvement but no longer has a means of enteric nutrition. NGT placement was not unsuccessful There is still bilious drainage from her G-tube site and IR is asked to evaluate to see if we can get a new G-tube back through the tract into the stomach. Chart, PMHx, meds, labs, imaging reviewed.  Past Medical History  Diagnosis Date  . Bowel obstruction ([place])     colectomy 06/2015  . Anemia   . COPD (chronic obstructive pulmonary disease) ([place])   . Dementia   . Acute on chronic respiratory failure ([place])   . Anxiety   . Depression   . Diabetes mellitus without complication ([place])   . DVT (deep venous thrombosis) ([place])   . Recurrent Clostridium difficile diarrhea   . History of Pseudomonas pneumonia   . Severe protein-calorie malnutrition ([place])   . Chronic pain     Past Surgical History  Procedure Laterality Date  . Tracheostomy  07/14/2015    Guttenberg tube placement  07/14/2015    [place] resection      right hemicolectomy, [place] Right 06/24/2015  . Bronchoscopies    . Ventral hernia repair  ????  . Incisional hernia repair  06/24/2015    with colectomy     Allergies: Codeine  Medications:  Current facility-administered medications:  .  antiseptic oral rinse solution (CORINZ), 7 mL, Mouth Rinse, QID, Kara Mead V, MD, 7 mL at 12/13/15 0305 .  ceftazidime-avibactam (AVYCAZ) 1.25 g in dextrose 5 % 50 mL IVPB, 1.25 g, Intravenous, Q8H, Jake Church Masters, RPH, 1.25 g at 12/13/15 0305 .  chlorhexidine gluconate (SAGE KIT) (PERIDEX) 0.12 % solution 15 mL, 15 mL, Mouth Rinse, BID, Kara Mead V, MD, 15 mL at 12/13/15 0804 .  collagenase (SANTYL) ointment, , Topical, Daily, Kara Mead V, MD .  dextrose 5 % 1,000 mL with potassium chloride 40 mEq infusion, , Intravenous, Continuous, Cherene Altes, MD, Last Rate: 50 mL/hr at 12/12/15 2005 .  famotidine (PEPCID) IVPB 20 mg premix, 20 mg, Intravenous, Q12H, Cherene Altes, MD, 20 mg at 12/13/15 0959 .  gentamicin (GARAMYCIN) IVPB 60 mg, 60 mg, Intravenous, Q24H, Erenest Blank, RPH, 60 mg at 12/13/15 0044 .  heparin injection 5,000 Units, 5,000 Units, Subcutaneous, Q8H, Rahul P Desai, PA-C, 5,000 Units at 12/13/15 0538 .  insulin aspart (novoLOG) injection 0-9 Units, 0-9 Units, Subcutaneous, Q4H, Rahul P Desai, PA-C, 1 Units at 12/13/15 0325 .  LORazepam (ATIVAN) injection 0.5-1 mg, 0.5-1 mg, Intravenous, Q4H PRN, Cherene Altes, MD, 1 mg at 12/10/15 1633 .  metroNIDAZOLE (FLAGYL) IVPB 500 mg, 500 mg, Intravenous, Q8H, Campbell Riches, MD, 500 mg at 12/13/15 0804 .  morphine 2 MG/ML injection  2 mg, 2 mg, Intravenous, Q4H PRN, Raylene Miyamoto, MD, 2 mg at 12/13/15 0745 .  morphine 4 MG/ML injection 4 mg, 4 mg, Intravenous, Q4H PRN, Raylene Miyamoto, MD, 4 mg at 12/12/15 1347 .  sodium chloride flush (NS) 0.9 % injection 10-40 mL, 10-40 mL, Intracatheter, PRN, [place] Sabin Deterding, MD .  sodium chloride flush (NS) 0.9 % injection 10-40 mL, 10-40 mL, Intracatheter, Q12H, Allie Bossier, MD, 10 mL at 12/12/15 2145 .  sodium chloride flush (NS) 0.9 % injection 10-40 mL, 10-40 mL,  Intracatheter, PRN, Allie Bossier, MD .  vancomycin (VANCOCIN) 50 mg/mL oral solution 500 mg, 500 mg, Oral, Q6H, Kara Mead V, MD, 500 mg at 12/06/15 1600 .  vancomycin (VANCOCIN) IVPB 750 mg/150 ml premix, 750 mg, Intravenous, Q36H, Erenest Blank, RPH, 750 mg at 12/13/15 0139    History reviewed. No pertinent family history.  Social History   Social History  . Marital Status: Unknown    Spouse Name: N/A  . Number of Children: N/A  . Years of Education: N/A   Social History Main Topics  . Smoking status: Former Smoker    Types: Cigarettes  . Smokeless tobacco: Never Used  . Alcohol Use: No  . Drug Use: No  . Sexual Activity: No   Other Topics Concern  . None   Social History Narrative     Review of Systems: A 12 point ROS discussed and pertinent positives are indicated in the HPI above.  All other systems are negative.  Review of Systems  Vital Signs: BP 139/69 mmHg  Pulse 66  Temp(Src) 97.5 F (36.4 C) (Axillary)  Resp 22  Ht 5' (1.524 m)  Wt 122 lb 12.7 oz (55.7 kg)  BMI 23.98 kg/m2  SpO2 99%  Physical Exam Trach intact Heart: Regular Lungs: CTA, equal Abd: soft, ND. LUQ wound with ostomy bag over site collecting bilious output   Mallampati Score:  MD Evaluation Airway: Other (comments) Airway comments: tracheostomy Heart: WNL Abdomen: Other (comments) Abdomen comments: LUQ wound with bil;ious drainage Chest/ Lungs: WNL ASA  Classification: 3 Mallampati/Airway Score:  (tracheostomy)   Labs:  CBC:  Recent Labs  12/05/15 0400 12/06/15 0533 12/11/15 0453 12/13/15 0315  WBC 59.1* 35.1* 13.2* 14.1*  HGB 7.3* 8.7* 8.4* 9.4*  HCT 23.6* 26.3* 28.7* 30.4*  PLT 653* 596* 579* 460*    COAGS:  Recent Labs  09/26/15 1030  INR 1.20    BMP:  Recent Labs  12/06/15 0533 12/09/15 2000 12/11/15 0453 12/13/15 0315  NA 141 150* 155* 146*  K 4.1 3.6 3.3* 3.9  CL 95* 115* 118* 115*  CO2 33* '22 25 24  '$ GLUCOSE 184* 123* 132* 132*  BUN  141* 63* 45* 26*  CALCIUM 7.5* 7.6* 7.9* 7.5*  CREATININE 1.38* 1.04* 1.05* 0.83  GFRNONAA 38* 53* 53* >60  GFRAA 44* >60 >60 >60    LIVER FUNCTION TESTS:  Recent Labs  12/04/15 1928 12/06/15 0533 12/11/15 0453 12/13/15 0315  BILITOT 0.5 0.5 0.8 0.6  AST 26 21 11* 10*  ALT 14 15 10* 7*  ALKPHOS 240* 100 78 78  PROT 6.3* 5.5* 5.2* 5.7*  ALBUMIN 1.2* 1.1* 1.2* 1.3*    Assessment and Plan: Dislodged G-tube, PCM, FTT Has been several days since tube has been out, but tract to stomach may be patent as continues to drain bilious output. Can bring to IR for attempt to replace G-tube through existing tract. LMOM for son Louie Casa to discuss.  Thank you for this interesting consult.   A copy of this report was sent to the requesting provider on this date.  Electronically Signed: Ascencion Dike 12/13/2015, 11:57 AM   I spent a total of 20 minutes in face to face in clinical consultation, greater than 50% of which was counseling/coordinating care for replacement of gastrostomy tube

## 2015-12-14 LAB — CULTURE, BLOOD (ROUTINE X 2)
CULTURE: NO GROWTH
Culture: NO GROWTH

## 2015-12-14 LAB — GLUCOSE, CAPILLARY
GLUCOSE-CAPILLARY: 123 mg/dL — AB (ref 65–99)
GLUCOSE-CAPILLARY: 86 mg/dL (ref 65–99)
GLUCOSE-CAPILLARY: 114 mg/dL — AB (ref 65–99)
Glucose-Capillary: 112 mg/dL — ABNORMAL HIGH (ref 65–99)
Glucose-Capillary: 110 mg/dL — ABNORMAL HIGH (ref 65–99)

## 2015-12-14 MED ORDER — DEXTROSE 50 % IV SOLN
INTRAVENOUS | Status: AC
  Filled 2015-12-14: qty 50

## 2015-12-14 NOTE — Progress Notes (Signed)
12/14/2015 Patient temp at 1600 was 94.9 orally and rectally it was 95.6 at 1648. Dr Sharon SellerMcclung was made aware order was given for patient to be place on a warming blanket. [facility] Marton [facility].

## 2015-12-14 NOTE — Progress Notes (Signed)
12/14/2015 Patient had 250 cc of green drainage from pouch where peg tube site is. [facility] Trella Thurmond RN.

## 2015-12-14 NOTE — Progress Notes (Signed)
[place] TEAM 1 - Stepdown/ICU TEAM  Kristen Arnold  NTI:[id number] DOB: 20-Sep-1945 DOA: 12/04/2015 PCP: Verneita Griffes, MD    Brief Narrative:  70 y.o. 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 She had a hospitalization to [facility] ICU 09/26/15 through 10/01/15 for aspiration PNA. During that admission she failed a swallow eval so her chronic G tube was exchanged for J tube by IR. Blood cultures that admission were noteable for coag neg staph and pseudomonas aeruginosa, sputum cultures were also noteable for pseudomonas (sputum resistant to both imipenem and zosyn; sputum and blood were both only sensitive to cipro). Due to her hx of C.diff, she was started on oral vanc per recs from ID.  Following her course of abx, she was discharged back to [facility].  On 05/04, she was brought to [facility] ED due to reports of AMS w/ minimal responsiveness x 24 hours. In the ED she was found to have septic shock presumed due to LLL HCAP as well as anemia with Hgb 6.1. She was started on levophed and 2u PRBC were ordered.  Significant Events: 5/4 Admitted with septic shock presumed due to HCAP as well as anemia (Hgb 6.1) 5/6 L lung collapse, peep, dnr 5/7 some improved aeration left 5/12 failed attempts to place NG feeding tube   Assessment & Plan:  Chronic tracheostomy / vent dependence Care per PCCM - attempts at weaning of late have been unsuccessful   LLL HCAP w/ septic shock  Required levophed earlier in hospital stay - Hx pseudomonal and coag neg staph bacteremia Feb - sepsis physiology has resolved   Polymicrobial bacteremia with Enterococcus, CRE klebsiella, pseudomonas  abx continue per ID   LLL collapse  Care per PCCM - ongoing chest PT - increased PEEP   C diff colitis On flagyl per ID    COPD No evidence of acute exacerbation   Anemia - acute on chronic Hgb stable    Recent Labs Lab  12/11/15 0453 12/13/15 0315  HGB 8.4* 9.4*   Hx DVT  Sacral decubitus ulcer stage IV Care as per WOC Team  Altered mental status  likely due to sepsis and severe uremia - mental status much improved   Hx anxiety / depression + chronic pain Calm   AKI - in setting of shock crt has normalized    Recent Labs Lab 12/09/15 2000 12/11/15 0453 12/13/15 0315  CREATININE 1.04* 1.05* 0.83    Severe malnutrition in context of chronic illness Presently has no means of ongoing nutritional support (J tube removed this hospitalization as it was not working) - attempts to place NG tube 5/12 failed despite fluoroscopy - IR to replace PEG 5/15   DM CBG stable   MRSA screen +   DVT prophylaxis: SQ heparin  Code Status: NO CODE BLUE - DNR  Family Communication: no family present at time of exam  Disposition Plan: SDU - return to vent SNF once PEG tube replaced    Consultants:  ID PCCM Palliative Care   Procedures:  8/67 R basilic PICC  Antimicrobials:  Per ID  Subjective: Sleeping comfortably.    Objective: Blood pressure 108/56, pulse 55, temperature 94.9 F (34.9 C), temperature source Oral, resp. rate 18, height 5' (1.524 m), weight 60.9 kg (134 lb 4.2 oz), SpO2 99 %.  Intake/Output Summary (Last 24 hours) at 12/14/15 1629 Last data filed at 12/14/15 0357  Gross per 24 hour  Intake  470 ml  Output    400 ml  Net     70 ml   Filed Weights   12/12/15 0500 12/13/15 0540 12/14/15 0342  Weight: 52.8 kg (116 lb 6.5 oz) 55.7 kg (122 lb 12.7 oz) 60.9 kg (134 lb 4.2 oz)    Examination: No exam today   CBC:  Recent Labs Lab 12/11/15 0453 12/13/15 0315  WBC 13.2* 14.1*  HGB 8.4* 9.4*  HCT 28.7* 30.4*  MCV 90.5 90.5  PLT 579* 767*   Basic Metabolic Panel:  Recent Labs Lab 12/09/15 2000 12/11/15 0453 12/11/15 1140 12/13/15 0315  NA 150* 155*  --  146*  K 3.6 3.3*  --  3.9  CL 115* 118*  --  115*  CO2 22 25  --  24  GLUCOSE 123* 132*  --  132*   BUN 63* 45*  --  26*  CREATININE 1.04* 1.05*  --  0.83  CALCIUM 7.6* 7.9*  --  7.5*  MG  --   --  1.4*  --    GFR: Estimated Creatinine Clearance: 51.5 mL/min (by C-G formula based on Cr of 0.83).   Liver Function Tests:  Recent Labs Lab 12/11/15 0453 12/13/15 0315  AST 11* 10*  ALT 10* 7*  ALKPHOS 78 78  BILITOT 0.8 0.6  PROT 5.2* 5.7*  ALBUMIN 1.2* 1.3*   CBG:  Recent Labs Lab 12/13/15 2035 12/13/15 2305 12/14/15 0355 12/14/15 0757 12/14/15 1245  GLUCAP 106* 110* 112* 123* 86    Recent Results (from the past 240 hour(s))  Blood Culture (routine x 2)     Status: Abnormal (Preliminary result)   Collection Time: 12/04/15  7:28 PM  Result Value Ref Range Status   Specimen Description BLOOD PICC LINE  Final   Special Requests BOTTLES DRAWN AEROBIC AND ANAEROBIC 5CC  Final   Culture  Setup Time   Final    Organism ID to follow [place] RODS IN BOTH AEROBIC AND ANAEROBIC BOTTLES CRITICAL RESULT CALLED TO, READ BACK BY AND VERIFIED WITH: C. STEWART, PHARM D AT 3419 ON [id number] BY S. YARBROUGH    Culture (A)  Final    KLEBSIELLA PNEUMONIAE CARBAPENEMASE RESISTANT ENTEROBACTERIACAE PSEUDOMONAS AERUGINOSA ENTEROCOCCUS SPECIES RESULT CALLED TO, READ BACK BY AND VERIFIED WITH: DR HATCHER 12/08/15 @ 46 M VESTAL NOTIFIED M DAVIS W/ INFECTION PREVENTION 12/08/15 @ [place] TO FOLLOW FOR KLEBSIELLA AND PSEUDOMONAS    Report Status PENDING  Incomplete   Organism ID, Bacteria KLEBSIELLA PNEUMONIAE  Final   Organism ID, Bacteria PSEUDOMONAS AERUGINOSA  Final   Organism ID, Bacteria ENTEROCOCCUS SPECIES  Final      Susceptibility   Klebsiella pneumoniae - MIC*    AMPICILLIN >=32 RESISTANT Resistant     CEFAZOLIN >=64 RESISTANT Resistant     CEFEPIME 16 RESISTANT Resistant     CEFTAZIDIME >=64 RESISTANT Resistant     CEFTRIAXONE >=64 RESISTANT Resistant     CIPROFLOXACIN >=4 RESISTANT Resistant     GENTAMICIN >=16  RESISTANT Resistant     IMIPENEM 8 RESISTANT Resistant     TRIMETH/SULFA >=320 RESISTANT Resistant     AMPICILLIN/SULBACTAM >=32 RESISTANT Resistant     PIP/TAZO >=128 RESISTANT Resistant     * KLEBSIELLA PNEUMONIAE   Pseudomonas aeruginosa - MIC*    CEFTAZIDIME >=64 RESISTANT Resistant     CIPROFLOXACIN 2 INTERMEDIATE Intermediate     GENTAMICIN <=1 SENSITIVE Sensitive     IMIPENEM >=16 RESISTANT Resistant  CEFEPIME 32 RESISTANT Resistant     * PSEUDOMONAS AERUGINOSA   Enterococcus species - MIC*    AMPICILLIN 4 RESISTANT Resistant     VANCOMYCIN 4 SENSITIVE Sensitive     GENTAMICIN SYNERGY RESISTANT Resistant     * ENTEROCOCCUS SPECIES  Blood Culture (routine x 2)     Status: Abnormal   Collection Time: 12/04/15  7:28 PM  Result Value Ref Range Status   Specimen Description BLOOD PICC LINE  Final   Special Requests BOTTLES DRAWN AEROBIC AND ANAEROBIC 5CC  Final   Culture  Setup Time   Final    GRAM POSITIVE COCCI GRAM NEGATIVE RODS IN BOTH AEROBIC AND ANAEROBIC BOTTLES CRITICAL RESULT CALLED TO, READ BACK BY AND VERIFIED WITH: C. STEWART, PHARM D AT 0855 ON [id number] BY Rhea Bleacher    Culture (A)  Final    KLEBSIELLA PNEUMONIAE PSEUDOMONAS AERUGINOSA ENTEROCOCCUS SPECIES SUSCEPTIBILITIES PERFORMED ON PREVIOUS CULTURE WITHIN THE LAST 5 DAYS.    Report Status 12/10/2015 FINAL  Final  Blood Culture ID Panel (Reflexed)     Status: Abnormal   Collection Time: 12/04/15  7:28 PM  Result Value Ref Range Status   Enterococcus species DETECTED (A) NOT DETECTED Final    Comment: CRITICAL RESULT CALLED TO, READ BACK BY AND VERIFIED WITH: C. STEWART, PHARM D AT 0855 ON [id number] BY S. YARBROUGH    Vancomycin resistance NOT DETECTED NOT DETECTED Final   Listeria monocytogenes NOT DETECTED NOT DETECTED Final   Staphylococcus species NOT DETECTED NOT DETECTED Final   Staphylococcus aureus NOT DETECTED NOT DETECTED Final   Methicillin resistance NOT DETECTED NOT DETECTED Final    Streptococcus species NOT DETECTED NOT DETECTED Final   Streptococcus agalactiae NOT DETECTED NOT DETECTED Final   Streptococcus pneumoniae NOT DETECTED NOT DETECTED Final   Streptococcus pyogenes NOT DETECTED NOT DETECTED Final   Acinetobacter baumannii NOT DETECTED NOT DETECTED Final   Enterobacteriaceae species NOT DETECTED NOT DETECTED Final   Enterobacter cloacae complex NOT DETECTED NOT DETECTED Final   Escherichia coli NOT DETECTED NOT DETECTED Final   Klebsiella oxytoca NOT DETECTED NOT DETECTED Final   Klebsiella pneumoniae DETECTED (A) NOT DETECTED Final    Comment: CRITICAL RESULT CALLED TO, READ BACK BY AND VERIFIED WITH: C. STEWART,PHARM D AT 0855 ON [id number] BY S. YARBROUGH    Proteus species NOT DETECTED NOT DETECTED Final   Serratia marcescens NOT DETECTED NOT DETECTED Final   Carbapenem resistance DETECTED (A) NOT DETECTED Final    Comment: CRITICAL RESULT CALLED TO, READ BACK BY AND VERIFIED WITH: C. STEWART,PHARM D AT 0855 ON [id number] BY S. YARBROUGH AND NOTIFIED NATALIE DAVIS IN INFECTION CONTROL AT 0900 ON [id number] BY S. YARBROUGH    Haemophilus influenzae NOT DETECTED NOT DETECTED Final   Neisseria meningitidis NOT DETECTED NOT DETECTED Final   Pseudomonas aeruginosa DETECTED (A) NOT DETECTED Final    Comment: CRITICAL RESULT CALLED TO, READ BACK BY AND VERIFIED WITH: CLamont Snowball D AT 0855 ON [id number] BY S. YARBROUGH    Candida albicans NOT DETECTED NOT DETECTED Final   Candida glabrata NOT DETECTED NOT DETECTED Final   Candida krusei NOT DETECTED NOT DETECTED Final   Candida parapsilosis NOT DETECTED NOT DETECTED Final   Candida tropicalis NOT DETECTED NOT DETECTED Final  Urine culture     Status: None   Collection Time: 12/04/15  7:48 PM  Result Value Ref Range Status   Specimen Description URINE, CATHETERIZED  Final   Special Requests NONE  Final  Culture NO GROWTH 1 DAY  Final   Report Status 12/06/2015 FINAL  Final  MRSA PCR Screening     Status: Abnormal    Collection Time: 12/05/15 12:04 AM  Result Value Ref Range Status   MRSA by PCR POSITIVE (A) NEGATIVE Final    Comment:        The GeneXpert MRSA Assay (FDA approved for NASAL specimens only), is one component of a comprehensive MRSA colonization surveillance program. It is not intended to diagnose MRSA infection nor to guide or monitor treatment for MRSA infections. RESULT CALLED TO, READ BACK BY AND VERIFIED WITH: Filbert Berthold RN 10:40 12/05/15 ([email or web address])   Wound culture     Status: None   Collection Time: 12/05/15  7:30 AM  Result Value Ref Range Status   Specimen Description WOUND  Final   Special Requests SACRAL  Final   Gram Stain   Final    FEW WBC PRESENT, PREDOMINANTLY PMN RARE SQUAMOUS EPITHELIAL CELLS PRESENT ABUNDANT GRAM NEGATIVE RODS MODERATE GRAM POSITIVE COCCI IN PAIRS FEW GRAM POSITIVE RODS Performed at [facility]    Culture   Final    ABUNDANT PSEUDOMONAS AERUGINOSA DRUG RESISTANT Note: COLISTIN SENSITIVE 2 ug/mL ETEST results for this drug are "FOR INVESTIGATIONAL USE ONLY" and should NOT be used for clinical purposes. MUTLI CRITICAL RESULT CALLED TO, READ BACK BY AND VERIFIED WITH: CAROLYN (RN)@ 10:58AM ON  12/10/2015 HAJAM Performed at [facility]    Report Status 12/10/2015 FINAL  Final   Organism ID, Bacteria PSEUDOMONAS AERUGINOSA  Final      Susceptibility   Pseudomonas aeruginosa - MIC*    CEFEPIME 32 RESISTANT Resistant     CEFTAZIDIME >=64 RESISTANT Resistant     CIPROFLOXACIN >=4 RESISTANT Resistant     GENTAMICIN <=1 SENSITIVE Sensitive     IMIPENEM >=16 RESISTANT Resistant     TOBRAMYCIN <=1 SENSITIVE Sensitive     * ABUNDANT PSEUDOMONAS AERUGINOSA  C difficile quick scan w PCR reflex     Status: Abnormal   Collection Time: 12/05/15  6:01 PM  Result Value Ref Range Status   C Diff antigen POSITIVE (A) NEGATIVE Final   C Diff toxin POSITIVE (A) NEGATIVE Final   C Diff interpretation Positive for toxigenic C. difficile   Final    Comment: CRITICAL RESULT CALLED TO, READ BACK BY AND VERIFIED WITH: Bryson Dames RN [id number] 12/05/15 A BROWNING   Culture, respiratory (tracheal aspirate)     Status: None   Collection Time: 12/05/15  8:53 PM  Result Value Ref Range Status   Specimen Description TRACHEAL ASPIRATE  Final   Special Requests NONE  Final   Gram Stain   Final    ABUNDANT WBC PRESENT, PREDOMINANTLY PMN FEW SQUAMOUS EPITHELIAL CELLS PRESENT ABUNDANT GRAM NEGATIVE RODS Performed at [facility]    Culture   Final    MODERATE PSEUDOMONAS AERUGINOSA Performed at [facility]    Report Status 12/08/2015 FINAL  Final   Organism ID, Bacteria PSEUDOMONAS AERUGINOSA  Final      Susceptibility   Pseudomonas aeruginosa - MIC*    CEFEPIME 16 INTERMEDIATE Intermediate     CEFTAZIDIME 32 RESISTANT Resistant     CIPROFLOXACIN 1 SENSITIVE Sensitive     GENTAMICIN <=1 SENSITIVE Sensitive     IMIPENEM >=16 RESISTANT Resistant     TOBRAMYCIN <=1 SENSITIVE Sensitive     * MODERATE PSEUDOMONAS AERUGINOSA  Culture, blood (Routine X 2) w Reflex to ID Panel  Status: Abnormal   Collection Time: 12/09/15 12:00 PM  Result Value Ref Range Status   Specimen Description BLOOD RIGHT ARM  Final   Special Requests IN PEDIATRIC BOTTLE 3CC  Final   Culture  Setup Time   Final    GRAM POSITIVE COCCI IN CLUSTERS IN PEDIATRIC BOTTLE CRITICAL RESULT CALLED TO, READ BACK BY AND VERIFIED WITH: C [place] A T 0904 12/10/15 BY L BENFIELD    Culture (A)  Final    STAPHYLOCOCCUS SPECIES (COAGULASE NEGATIVE) THE SIGNIFICANCE OF ISOLATING THIS ORGANISM FROM A SINGLE SET OF BLOOD CULTURES WHEN MULTIPLE SETS ARE DRAWN IS UNCERTAIN. PLEASE NOTIFY THE MICROBIOLOGY DEPARTMENT WITHIN ONE WEEK IF SPECIATION AND SENSITIVITIES ARE REQUIRED.    Report Status 12/12/2015 FINAL  Final  Culture, blood (routine x 2)     Status: None   Collection Time: 12/09/15 12:20 PM  Result Value Ref Range Status   Specimen Description BLOOD RIGHT  HAND  Final   Special Requests IN PEDIATRIC BOTTLE 3CC  Final   Culture NO GROWTH 5 DAYS  Final   Report Status 12/14/2015 FINAL  Final  Culture, blood (routine x 2)     Status: None   Collection Time: 12/09/15  3:45 PM  Result Value Ref Range Status   Specimen Description BLOOD LEFT ARM  Final   Special Requests IN PEDIATRIC BOTTLE 1.5CC  Final   Culture NO GROWTH 5 DAYS  Final   Report Status 12/14/2015 FINAL  Final      Scheduled Meds: . antiseptic oral rinse  7 mL Mouth Rinse QID  . ceftazidime avibactam (AVYCAZ) IVPB  1.25 g Intravenous Q8H  . chlorhexidine gluconate (SAGE KIT)  15 mL Mouth Rinse BID  . collagenase   Topical Daily  . famotidine (PEPCID) IV  20 mg Intravenous Q12H  . gentamicin  60 mg Intravenous Q24H  . heparin  5,000 Units Subcutaneous Q8H  . insulin aspart  0-9 Units Subcutaneous Q4H  . metronidazole  500 mg Intravenous Q8H  . sodium chloride flush  10-40 mL Intracatheter Q12H  . vancomycin  750 mg Intravenous Q36H   Continuous Infusions: . dextrose 5 % with kcl 50 mL/hr at 12/14/15 1539     LOS: 10 days    Time spent: no charge  Cherene Altes, MD [facility] Office  [phone] Pager - Text Page per Amion as per below:  On-Call/Text Page:      [email or web address]      password [email or web address]  If 7PM-7AM, please contact night-coverage [email or web address] Password [facility] 12/14/2015, 4:29 PM

## 2015-12-15 LAB — GLUCOSE, CAPILLARY
GLUCOSE-CAPILLARY: 150 mg/dL — AB (ref 65–99)
Glucose-Capillary: 157 mg/dL — ABNORMAL HIGH (ref 65–99)
Glucose-Capillary: 137 mg/dL — ABNORMAL HIGH (ref 65–99)
Glucose-Capillary: 93 mg/dL (ref 65–99)
Glucose-Capillary: 94 mg/dL (ref 65–99)
Glucose-Capillary: 113 mg/dL — ABNORMAL HIGH (ref 65–99)

## 2015-12-15 MED ORDER — CEFAZOLIN SODIUM 1-5 GM-% IV SOLN: 1.0000 g | Freq: Once | INTRAVENOUS | Status: DC

## 2015-12-15 NOTE — Progress Notes (Signed)
[name] TEAM 1 - Stepdown/ICU TEAM  Kristen Arnold  YIR:[id number] DOB: Dec 17, 1945 DOA: 12/04/2015 PCP: Verneita Griffes, MD    Brief Narrative:  70 y.o. 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 She had a hospitalization to [facility] ICU 09/26/15 through 10/01/15 for aspiration PNA. During that admission she failed a swallow eval so her chronic G tube was exchanged for J tube by IR. Blood cultures that admission were noteable for coag neg staph and pseudomonas aeruginosa, sputum cultures were also noteable for pseudomonas (sputum resistant to both imipenem and zosyn; sputum and blood were both only sensitive to cipro). Due to her hx of C.diff, she was started on oral vanc per recs from ID.  Following her course of abx, she was discharged back to [facility].  On 05/04, she was brought to [facility] ED due to reports of AMS w/ minimal responsiveness x 24 hours. In the ED she was found to have septic shock presumed due to LLL HCAP as well as anemia with Hgb 6.1. She was started on levophed and 2u PRBC were ordered.  Significant Events: 5/4 Admitted with septic shock presumed due to HCAP as well as anemia (Hgb 6.1) 5/6 L lung collapse, peep, dnr 5/7 some improved aeration left 5/12 failed attempts to place NG feeding tube 5/14 hypothermic   Assessment & Plan:  Hypothermia  No localizing infectious signs - WBC had been on steady decline - cont warming blanket - follow clinically - repeat blood cultures today   Chronic tracheostomy / vent dependence Care per PCCM - attempts at weaning of late have been unsuccessful - tolerating trach collar at time of my exam today   LLL HCAP w/ septic shock  Required levophed earlier in hospital stay - Hx pseudomonal and coag neg staph bacteremia Feb  Polymicrobial bacteremia with Enterococcus, CRE klebsiella, pseudomonas  abx per ID   LLL collapse  Care per PCCM - f/u CXR  in AM   C diff colitis On flagyl per ID    COPD No evidence of acute exacerbation   Anemia - acute on chronic Hgb stable    Recent Labs Lab 12/11/15 0453 12/13/15 0315  HGB 8.4* 9.4*   Hx DVT  Sacral decubitus ulcer stage IV Care as per WOC Team  Altered mental status  Mental status waxes/wanes - following    Hx anxiety / depression + chronic pain  AKI in setting of shock - resolved crt has normalized    Recent Labs Lab 12/09/15 2000 12/11/15 0453 12/13/15 0315  CREATININE 1.04* 1.05* 0.83    Severe malnutrition in context of chronic illness Presently has no means of ongoing nutritional support (J tube removed this hospitalization as it was not working) - attempts to place NG tube 5/12 failed despite fluoroscopy - IR to replace PEG 5/15   DM CBG stable   MRSA screen +   DVT prophylaxis: SQ heparin  Code Status: NO CODE BLUE - DNR  Family Communication: no family present at time of exam  Disposition Plan: SDU   Consultants:  ID PCCM Palliative Care   Procedures:  5/00 R basilic PICC  Antimicrobials:  Per ID  Subjective: Does not respond to my voice or exam today.    Objective: Blood pressure 141/80, pulse 98, temperature 96.6 F (35.9 C), temperature source Oral, resp. rate 30, height 5' (1.524 m), weight 59.8 kg (131 lb 13.4 oz), SpO2 97 %.  Intake/Output  Summary (Last 24 hours) at 12/15/15 1501 Last data filed at 12/15/15 1224  Gross per 24 hour  Intake   1000 ml  Output    950 ml  Net     50 ml   Filed Weights   12/13/15 0540 12/14/15 0342 12/15/15 0401  Weight: 55.7 kg (122 lb 12.7 oz) 60.9 kg (134 lb 4.2 oz) 59.8 kg (131 lb 13.4 oz)    Examination: General: No acute respiratory distress evident on trach collar  Lungs: poor air movement B bases - no wheeze  Cardiovascular: Regular rate and rhythm without murmur  Abdomen: Nondistended, soft, bowel sounds positive, no ascites, no appreciable mass Extremities: No significant  cyanosis, clubbing, edema bilateral lower extremities   CBC:  Recent Labs Lab 12/11/15 0453 12/13/15 0315  WBC 13.2* 14.1*  HGB 8.4* 9.4*  HCT 28.7* 30.4*  MCV 90.5 90.5  PLT 579* 962*   Basic Metabolic Panel:  Recent Labs Lab 12/09/15 2000 12/11/15 0453 12/11/15 1140 12/13/15 0315  NA 150* 155*  --  146*  K 3.6 3.3*  --  3.9  CL 115* 118*  --  115*  CO2 22 25  --  24  GLUCOSE 123* 132*  --  132*  BUN 63* 45*  --  26*  CREATININE 1.04* 1.05*  --  0.83  CALCIUM 7.6* 7.9*  --  7.5*  MG  --   --  1.4*  --    GFR: Estimated Creatinine Clearance: 51 mL/min (by C-G formula based on Cr of 0.83).   Liver Function Tests:  Recent Labs Lab 12/11/15 0453 12/13/15 0315  AST 11* 10*  ALT 10* 7*  ALKPHOS 78 78  BILITOT 0.8 0.6  PROT 5.2* 5.7*  ALBUMIN 1.2* 1.3*   CBG:  Recent Labs Lab 12/14/15 2019 12/14/15 2338 12/15/15 0355 12/15/15 0816 12/15/15 1219  GLUCAP 110* 150* 157* 137* 93    Recent Results (from the past 240 hour(s))  C difficile quick scan w PCR reflex     Status: Abnormal   Collection Time: 12/05/15  6:01 PM  Result Value Ref Range Status   C Diff antigen POSITIVE (A) NEGATIVE Final   C Diff toxin POSITIVE (A) NEGATIVE Final   C Diff interpretation Positive for toxigenic C. difficile  Final    Comment: CRITICAL RESULT CALLED TO, READ BACK BY AND VERIFIED WITH: Bryson Dames RN 2297 12/05/15 A BROWNING   Culture, respiratory (tracheal aspirate)     Status: None   Collection Time: 12/05/15  8:53 PM  Result Value Ref Range Status   Specimen Description TRACHEAL ASPIRATE  Final   Special Requests NONE  Final   Gram Stain   Final    ABUNDANT WBC PRESENT, PREDOMINANTLY PMN FEW SQUAMOUS EPITHELIAL CELLS PRESENT ABUNDANT GRAM NEGATIVE RODS Performed at [facility]    Culture   Final    MODERATE PSEUDOMONAS AERUGINOSA Performed at [facility]    Report Status 12/08/2015 FINAL  Final   Organism ID, Bacteria PSEUDOMONAS AERUGINOSA   Final      Susceptibility   Pseudomonas aeruginosa - MIC*    CEFEPIME 16 INTERMEDIATE Intermediate     CEFTAZIDIME 32 RESISTANT Resistant     CIPROFLOXACIN 1 SENSITIVE Sensitive     GENTAMICIN <=1 SENSITIVE Sensitive     IMIPENEM >=16 RESISTANT Resistant     TOBRAMYCIN <=1 SENSITIVE Sensitive     * MODERATE PSEUDOMONAS AERUGINOSA  Culture, blood (Routine X 2) w Reflex to ID Panel  Status: Abnormal   Collection Time: 12/09/15 12:00 PM  Result Value Ref Range Status   Specimen Description BLOOD RIGHT ARM  Final   Special Requests IN PEDIATRIC BOTTLE 3CC  Final   Culture  Setup Time   Final    GRAM POSITIVE COCCI IN CLUSTERS IN PEDIATRIC BOTTLE CRITICAL RESULT CALLED TO, READ BACK BY AND VERIFIED WITH: C Winslow A T 0904 12/10/15 BY L BENFIELD    Culture (A)  Final    STAPHYLOCOCCUS SPECIES (COAGULASE NEGATIVE) THE SIGNIFICANCE OF ISOLATING THIS ORGANISM FROM A SINGLE SET OF BLOOD CULTURES WHEN MULTIPLE SETS ARE DRAWN IS UNCERTAIN. PLEASE NOTIFY THE MICROBIOLOGY DEPARTMENT WITHIN ONE WEEK IF SPECIATION AND SENSITIVITIES ARE REQUIRED.    Report Status 12/12/2015 FINAL  Final  Culture, blood (routine x 2)     Status: None   Collection Time: 12/09/15 12:20 PM  Result Value Ref Range Status   Specimen Description BLOOD RIGHT HAND  Final   Special Requests IN PEDIATRIC BOTTLE 3CC  Final   Culture NO GROWTH 5 DAYS  Final   Report Status 12/14/2015 FINAL  Final  Culture, blood (routine x 2)     Status: None   Collection Time: 12/09/15  3:45 PM  Result Value Ref Range Status   Specimen Description BLOOD LEFT ARM  Final   Special Requests IN PEDIATRIC BOTTLE 1.5CC  Final   Culture NO GROWTH 5 DAYS  Final   Report Status 12/14/2015 FINAL  Final      Scheduled Meds: . antiseptic oral rinse  7 mL Mouth Rinse QID  . ceftazidime avibactam (AVYCAZ) IVPB  1.25 g Intravenous Q8H  . chlorhexidine gluconate (SAGE KIT)  15 mL Mouth Rinse BID  . collagenase   Topical Daily  . famotidine  (PEPCID) IV  20 mg Intravenous Q12H  . gentamicin  60 mg Intravenous Q24H  . heparin  5,000 Units Subcutaneous Q8H  . insulin aspart  0-9 Units Subcutaneous Q4H  . metronidazole  500 mg Intravenous Q8H  . sodium chloride flush  10-40 mL Intracatheter Q12H  . vancomycin  750 mg Intravenous Q36H   Continuous Infusions: . dextrose 5 % with kcl 50 mL/hr at 12/14/15 1539     LOS: 11 days    Time spent: 25 mins  Cherene Altes, MD [facility] Office  [phone] Pager - Text Page per Shea Evans as per below:  On-Call/Text Page:      [email or web address]      password [email or web address]  If 7PM-7AM, please contact night-coverage [email or web address] Password [email or web address] 12/15/2015, 3:01 PM

## 2015-12-15 NOTE — Progress Notes (Signed)
Placed patient on full support ventilation previous settings due to increased WOB, increased RR, increased HR and low SPO2.  Patient tolerating well at this time. RN aware. RT will continue to monitor.

## 2015-12-16 ENCOUNTER — Inpatient Hospital Stay (HOSPITAL_COMMUNITY): Payer: Medicare Other

## 2015-12-16 LAB — CBC
HEMATOCRIT: 27.2 % — AB (ref 36.0–46.0)
Hemoglobin: 8.4 g/dL — ABNORMAL LOW (ref 12.0–15.0)
MCH: 27.5 pg (ref 26.0–34.0)
MCHC: 30.9 g/dL (ref 30.0–36.0)
MCV: 89.2 fL (ref 78.0–100.0)
PLATELETS: 345 10*3/uL (ref 150–400)
RBC: 3.05 MIL/uL — ABNORMAL LOW (ref 3.87–5.11)
RDW: 20.8 % — AB (ref 11.5–15.5)
WBC: 10.7 10*3/uL — ABNORMAL HIGH (ref 4.0–10.5)

## 2015-12-16 LAB — MISCELLANEOUS TEST

## 2015-12-16 LAB — COMPREHENSIVE METABOLIC PANEL
ALBUMIN: 1.1 g/dL — AB (ref 3.5–5.0)
ALT: 7 U/L — AB (ref 14–54)
AST: 10 U/L — AB (ref 15–41)
Alkaline Phosphatase: 74 U/L (ref 38–126)
Anion gap: 8 (ref 5–15)
BUN: 18 mg/dL (ref 6–20)
CHLORIDE: 111 mmol/L (ref 101–111)
CO2: 22 mmol/L (ref 22–32)
CREATININE: 1.12 mg/dL — AB (ref 0.44–1.00)
Calcium: 7.6 mg/dL — ABNORMAL LOW (ref 8.9–10.3)
GFR calc Af Amer: 56 mL/min — ABNORMAL LOW (ref >60)
GFR calc non Af Amer: 49 mL/min — ABNORMAL LOW (ref >60)
GLUCOSE: 94 mg/dL (ref 65–99)
Potassium: 5 mmol/L (ref 3.5–5.1)
SODIUM: 141 mmol/L (ref 135–145)
Total Bilirubin: 0.6 mg/dL (ref 0.3–1.2)
Total Protein: 4.9 g/dL — ABNORMAL LOW (ref 6.5–8.1)

## 2015-12-16 LAB — CULTURE, BLOOD (ROUTINE X 2)

## 2015-12-16 LAB — GLUCOSE, CAPILLARY
GLUCOSE-CAPILLARY: 102 mg/dL — AB (ref 65–99)
GLUCOSE-CAPILLARY: 94 mg/dL (ref 65–99)
GLUCOSE-CAPILLARY: 94 mg/dL (ref 65–99)
Glucose-Capillary: 104 mg/dL — ABNORMAL HIGH (ref 65–99)
Glucose-Capillary: 123 mg/dL — ABNORMAL HIGH (ref 65–99)
Glucose-Capillary: 132 mg/dL — ABNORMAL HIGH (ref 65–99)
Glucose-Capillary: 89 mg/dL (ref 65–99)

## 2015-12-16 LAB — CORTISOL: Cortisol, Plasma: 12.1 ug/dL

## 2015-12-16 MED ORDER — IOPAMIDOL (ISOVUE-300) INJECTION 61%
INTRAVENOUS | Status: AC
  Administered 2015-12-16: 20 mL
  Filled 2015-12-16: qty 50

## 2015-12-16 MED ORDER — FAMOTIDINE 40 MG/5ML PO SUSR
20.0000 mg | Freq: Two times a day (BID) | ORAL | Status: DC
  Administered 2015-12-16 – 2015-12-18 (×4): 20 mg
  Filled 2015-12-16 (×4): qty 2.5

## 2015-12-16 MED ORDER — SODIUM CHLORIDE 0.9 % IV SOLN
INTRAVENOUS | Status: DC
  Administered 2015-12-16: 19:00:00 via INTRAVENOUS

## 2015-12-16 MED ORDER — ADULT MULTIVITAMIN LIQUID CH
15.0000 mL | Freq: Every day | ORAL | Status: DC
  Administered 2015-12-16 – 2015-12-18 (×3): 15 mL
  Filled 2015-12-16 (×3): qty 15

## 2015-12-16 MED ORDER — VANCOMYCIN 50 MG/ML ORAL SOLUTION
125.0000 mg | Freq: Four times a day (QID) | ORAL | Status: DC
Start: 2015-12-16 — End: 2015-12-18
  Administered 2015-12-16 – 2015-12-18 (×8): 125 mg
  Filled 2015-12-16 (×10): qty 2.5

## 2015-12-16 MED ORDER — MIDAZOLAM HCL 2 MG/2ML IJ SOLN
INTRAMUSCULAR | Status: AC
  Filled 2015-12-16: qty 2

## 2015-12-16 MED ORDER — JEVITY 1.2 CAL PO LIQD
1000.0000 mL | ORAL | Status: DC
Start: 2015-12-16 — End: 2015-12-18
  Administered 2015-12-16: 40 mL/h
  Administered 2015-12-17: 1000 mL
  Filled 2015-12-16 (×3): qty 1000

## 2015-12-16 MED ORDER — LIDOCAINE HCL 1 % IJ SOLN
INTRAMUSCULAR | Status: AC
  Filled 2015-12-16: qty 20

## 2015-12-16 MED ORDER — PRO-STAT SUGAR FREE PO LIQD
30.0000 mL | Freq: Two times a day (BID) | ORAL | Status: DC
Start: 2015-12-16 — End: 2015-12-18
  Administered 2015-12-16 – 2015-12-18 (×4): 30 mL
  Filled 2015-12-16 (×4): qty 30

## 2015-12-16 NOTE — Procedures (Signed)
SUCCESSFUL 18 fr GTUBE REPLACEMENT Ready for use Full report in PACS

## 2015-12-16 NOTE — Progress Notes (Signed)
Pharmacy Antibiotic Note  Kristen Arnold is a 70 y.o. female admitted on 12/04/2015 with AMS and unresponsiveness.  Pharmacy has been consulted for Avycaz + Vancomycin dosing..  The patient is on Vancomycin + Avycaz D#12/14 (per ID-MD count) for Enterococcus + KPC + PSA bacteremia. The patient completed a 7 day course of Gentamicin on 5/15. Stop dates have been entered for Vanc + Avycaz on 5/18 per ID recommendations from last note on 5/12. SCr 1.12, CrCl~35-40 ml/min, UOP 1.2 ml/kg/hr.   Plan: 1. Continue Vancomycin 750 mg IV every 36 hours (prior will receive 1 more dose) 2. Continue Avycaz 1.25g IV every 8 hours 3. Will continue to follow renal function, culture results, LOT, and antibiotic de-escalation plans   Height: 5' (152.4 cm) Weight: 128 lb 8.5 oz (58.3 kg) IBW/kg (Calculated) : 45.5  Temp (24hrs), Avg:97.5 F (36.4 C), Min:96.2 F (35.7 C), Max:99.4 F (37.4 C)   Recent Labs Lab 12/09/15 2000 12/10/15 0316 12/11/15 0453 12/12/15 2130 12/12/15 2237 12/13/15 0315 12/16/15 0522  WBC  --   --  13.2*  --   --  14.1* 10.7*  CREATININE 1.04*  --  1.05*  --   --  0.83 1.12*  VANCOTROUGH 20  --   --  21*  --   --   --   GENTTROUGH  --   --   --   --  2.8*  --   --   GENTRANDOM  --  4.3  --   --   --   --   --     Estimated Creatinine Clearance: 37.3 mL/min (by C-G formula based on Cr of 1.12).    Allergies  Allergen Reactions  . Codeine     Unknown reaction, listed on MAR    Antimicrobials this admission: Cipro 5/5 >> 5/5 Zosyn 5/4 >> 5/5 Vanc 5/4 >> (5/18) Avycaz 5/5 >> (5/18) Gent 5/8 >> (5/15)  5/9 Gent 10 hr level = 11.6  5/10 GR = 4.3  5/12 GT = 2.8, change to 60 mg IV q24h Flagyl 5/5 >> PO Vanc >> not receiving yet  Dose adjustments this admission:  Vancomycin:  * 5/9 VT = 20 * 5/12 VT = 21, change to 750 q36h  Gentamicin:   * 5/9 Gent 10 hr level = 11.6 * 5/10 GR = 4.3 * 5/12 GT = 2.8, change to 60 mg IV q24h  Microbiology  results: 5/4 BCID >> KPC, Enterococcus (Vanc MIC = 4), Pseudomonas 5/4 BCx x 2 >> Kleb pneumo (pan resistant), CRE, Pseudomonas (sensitive to gent) 5/4 Urine >> negative 5/5 MRSA PCR >> positive  5/5 C.diff >> positive 5/5 WCx >> PSA 5/5 RCx >> PSA (sensitive Cipro, gent, tobra) 5/9 BCx > 1/3 CONS 5/15 BCx >>  Thank you for allowing pharmacy to be a part of this patient's care.  Georgina PillionElizabeth Antonea Gaut, PharmD, BCPS Clinical Pharmacist Pager: [phone] 12/16/2015 10:02 AM

## 2015-12-16 NOTE — Care Management Important Message (Signed)
Important Message  Patient Details  Name: Kristen ChessJudy Oommen MRN: [id number] Date of Birth: 02/13/1946   Medicare Important Message Given:  Yes    Shone Leventhal, Chapman FitchHenrietta T, RN 12/16/2015, 10:58 AM

## 2015-12-16 NOTE — NC FL2 (Signed)
[place] LEVEL OF CARE SCREENING TOOL     IDENTIFICATION  Patient Name: Kristen Arnold Birthdate: Jan 26, 1946 Sex: female Admission Date (Current Location): 12/04/2015  [facility] and [place] Number:   Patrick [place])   Facility and Address:  [facility], [place] [address], [place], [place] [address]      Provider Number: [id number]  Attending Physician Name and Address:  Cherene Altes, MD  Relative Name and Phone Number:       Current Level of Care: SNF Recommended Level of Care: [place] Prior Approval Number:    Date Approved/Denied:   PASRR Number: [id number]  Discharge Plan: SNF    Current Diagnoses: Patient Active Problem List   Diagnosis Date Noted  . Dysphagia   . Sepsis due to Pseudomonas ([place])   . Bacteremia due to Enterococcus   . Bacteremia due to Klebsiella pneumoniae   . Bacteremia due to Pseudomonas   . C. difficile colitis   . Clostridium difficile colitis   . CRKP (carbapenem-resistant Klebsiella pneumoniae) infection   . Palliative care encounter   . Acute respiratory failure ([place])   . Encounter for central line placement   . Sepsis ([place]) 12/04/2015  . Acute respiratory failure with hypoxia ([place])   . AKI (acute kidney injury) ([place])   . HCAP (healthcare-associated pneumonia)   . Arterial hypotension   . Septic shock ([place])   . Acute on chronic respiratory failure ([place]) 09/27/2015  . Severe sepsis ([place]) 09/27/2015  . Gram-negative bacteremia ([place]) 09/27/2015  . Tracheostomy status ([place]) 09/27/2015  . Decubitus ulcer of sacral region, stage 4 ([place]) 09/27/2015  . Acute kidney injury ([place]) 09/27/2015  . Absolute anemia   . Chronic respiratory failure with hypoxia and hypercapnia (HCC)   . Small bowel obstruction ([place]) 09/26/2015  . Aspiration pneumonia ([place]) 09/26/2015  . Protein-calorie malnutrition, severe 09/26/2015  . Pressure ulcer 09/26/2015    Orientation RESPIRATION BLADDER Height & Weight       (DOx4)  O2, Vent Incontinent, Indwelling catheter Weight: 128 lb 8.5 oz (58.3 kg) Height:  5' (152.4 cm)  BEHAVIORAL SYMPTOMS/MOOD NEUROLOGICAL BOWEL NUTRITION STATUS      Incontinent Feeding tube  AMBULATORY STATUS COMMUNICATION OF NEEDS Skin   Total Care Does not communicate Other (Comment) (deep tissue injury on buttocks)                       Personal Care Assistance Level of Assistance  Total care       Total Care Assistance: Maximum assistance   Functional Limitations Info             SPECIAL CARE FACTORS FREQUENCY                       Contractures      Additional Factors Info  Code Status, Allergies, Isolation Precautions, Suctioning Needs Code Status Info: DNR Allergies Info: Codeine     Isolation Precautions Info: MRSA, MDRO Suctioning Needs: as needed   Current Medications (12/16/2015):  This is the current hospital active medication list Current Facility-Administered Medications  Medication Dose Route Frequency Provider Last Rate Last Dose  . antiseptic oral rinse solution (CORINZ)  7 mL Mouth Rinse QID Kara Mead V, MD   7 mL at 12/16/15 1249  . ceftazidime-avibactam (AVYCAZ) 1.25 g in dextrose 5 % 50 mL IVPB  1.25 g Intravenous Q8H Cherene Altes, MD   1.25 g at 12/16/15 1506  .  chlorhexidine gluconate (SAGE KIT) (PERIDEX) 0.12 % solution 15 mL  15 mL Mouth Rinse BID Kara Mead V, MD   15 mL at 12/16/15 0828  . collagenase (SANTYL) ointment   Topical Daily Rigoberto Noel, MD   1 application at 94/70/76 2034  . dextrose 5 % 1,000 mL with potassium chloride 40 mEq infusion   Intravenous Continuous Cherene Altes, MD 50 mL/hr at 12/16/15 0551    . famotidine (PEPCID) IVPB 20 mg premix  20 mg Intravenous Q12H Cherene Altes, MD   20 mg at 12/16/15 1248  . heparin injection 5,000 Units  5,000 Units Subcutaneous Q8H Rahul P Desai, PA-C   5,000 Units at 12/16/15 1506  . insulin aspart (novoLOG) injection 0-9 Units  0-9 Units  Subcutaneous Q4H Rahul P Desai, PA-C   1 Units at 12/15/15 0843  . lidocaine (XYLOCAINE) 1 % (with pres) injection           . LORazepam (ATIVAN) injection 0.5-1 mg  0.5-1 mg Intravenous Q4H PRN Cherene Altes, MD   1 mg at 12/16/15 1518  . metroNIDAZOLE (FLAGYL) IVPB 500 mg  500 mg Intravenous Q8H Campbell Riches, MD   500 mg at 12/16/15 0824  . morphine 2 MG/ML injection 2 mg  2 mg Intravenous Q4H PRN Raylene Miyamoto, MD   2 mg at 12/16/15 1506  . morphine 4 MG/ML injection 4 mg  4 mg Intravenous Q4H PRN Raylene Miyamoto, MD   4 mg at 12/12/15 1347  . sodium chloride flush (NS) 0.9 % injection 10-40 mL  10-40 mL Intracatheter PRN Allie Bossier, MD      . vancomycin (VANCOCIN) IVPB 750 mg/150 ml premix  750 mg Intravenous Q36H Cherene Altes, MD   750 mg at 12/16/15 3437     Discharge Medications: Please see discharge summary for a list of discharge medications.  Relevant Imaging Results:  Relevant Lab Results:   Additional Information SS#: [id number]  Cranford Mon, [place]

## 2015-12-16 NOTE — Progress Notes (Addendum)
[place] TEAM 1 - Stepdown/ICU TEAM  Kristen Arnold  XTK:[id number] DOB: 1946-01-19 DOA: 12/04/2015 PCP: Verneita Griffes, MD    Brief Narrative:  70 y.o. 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 She had a hospitalization to [facility] ICU 09/26/15 through 10/01/15 for aspiration PNA. During that admission she failed a swallow eval so her chronic G tube was exchanged for J tube by IR. Blood cultures that admission were noteable for coag neg staph and pseudomonas aeruginosa, sputum cultures were also noteable for pseudomonas (sputum resistant to both imipenem and zosyn; sputum and blood were both only sensitive to cipro). Due to her hx of C.diff, she was started on oral vanc per recs from ID.  Following her course of abx, she was discharged back to [facility].  On 05/04, she was brought to [facility] ED due to reports of AMS w/ minimal responsiveness x 24 hours. In the ED she was found to have septic shock presumed due to LLL HCAP as well as anemia with Hgb 6.1. She was started on levophed and 2u PRBC were ordered.  Significant Events: 5/4 Admitted with septic shock presumed due to HCAP as well as anemia (Hgb 6.1) 5/6 L lung collapse, peep, dnr 5/7 some improved aeration left 5/12 failed attempts to place NG feeding tube 5/14 hypothermic  5/16 G tube replaced via prior track - temp improved   Assessment & Plan:  Hypothermia  No localizing infectious signs - WBC on steady decline - now off warming blanket - follow clinically - repeat blood culture no growth thus far - ?autonomic dysfxn  Chronic tracheostomy / vent dependence Care per PCCM - attempts at weaning of late have been unsuccessful   LLL HCAP w/ septic shock  Required levophed earlier in hospital stay - Hx pseudomonal and coag neg staph bacteremia Feb  Polymicrobial bacteremia with Enterococcus, CRE klebsiella, pseudomonas  abx per ID - to stop  avycaz and vanc after 2 weeks of tx (5/18)  LLL collapse  f/u CXR today stable   C diff colitis On flagyl per ID - now that PEG in, begin prolonged vanc taper as per ID: 2 weeks 125 mg, qid (subtracting 12 days of IV flagyl = 2 days) 1 week 17m bid 1 week 1272mqday 2 weeks 12537mod  COPD No evidence of acute exacerbation   Anemia - acute on chronic Hgb stable    Recent Labs Lab 12/11/15 0453 12/13/15 0315 12/16/15 0522  HGB 8.4* 9.4* 8.4*   Hx DVT  Sacral decubitus ulcer stage IV Care as per WOC Team  Altered mental status  Mental status waxes/wanes - no signif change today    Hx anxiety / depression + chronic pain  AKI in setting of shock - resolved crt waxing/waning - following intermittently   Recent Labs Lab 12/09/15 2000 12/11/15 0453 12/13/15 0315 12/16/15 0522  CREATININE 1.04* 1.05* 0.83 1.12*    Severe malnutrition in context of chronic illness IR was able to replace PEG 5/16 - resume tube feeds   DM CBG stable   MRSA screen +   DVT prophylaxis: SQ heparin  Code Status: NO CODE BLUE - DNR  Family Communication: no family present at time of exam  Disposition Plan: SDU - possible return to vent capable SNF in 24-48hrs   Consultants:  ID PCCM Palliative Care   Procedures:  5/19/92basilic PICC  Antimicrobials:  Per ID (see above)  Subjective: Does not respond to my exam today.    Objective: Blood pressure 125/67, pulse 87, temperature 98.4 F (36.9 C), temperature source Oral, resp. rate 19, height 5' (1.524 m), weight 58.3 kg (128 lb 8.5 oz), SpO2 100 %.  Intake/Output Summary (Last 24 hours) at 12/16/15 1558 Last data filed at 12/16/15 1500  Gross per 24 hour  Intake   1380 ml  Output   1800 ml  Net   -420 ml   Filed Weights   12/14/15 0342 12/15/15 0401 12/16/15 0500  Weight: 60.9 kg (134 lb 4.2 oz) 59.8 kg (131 lb 13.4 oz) 58.3 kg (128 lb 8.5 oz)    Examination: General: No acute respiratory distress evident    Lungs: poor air movement B bases - no wheeze  Cardiovascular: RRR w/o M or rub  Abdomen: Nondistended, soft, bowel sounds positive, no ascites, no appreciable mass - PEG tube now in place  Extremities: No significant cyanosis, clubbing, or edema bilateral lower extremities   CBC:  Recent Labs Lab 12/11/15 0453 12/13/15 0315 12/16/15 0522  WBC 13.2* 14.1* 10.7*  HGB 8.4* 9.4* 8.4*  HCT 28.7* 30.4* 27.2*  MCV 90.5 90.5 89.2  PLT 579* 460* 833   Basic Metabolic Panel:  Recent Labs Lab 12/09/15 2000 12/11/15 0453 12/11/15 1140 12/13/15 0315 12/16/15 0522  NA 150* 155*  --  146* 141  K 3.6 3.3*  --  3.9 5.0  CL 115* 118*  --  115* 111  CO2 22 25  --  24 22  GLUCOSE 123* 132*  --  132* 94  BUN 63* 45*  --  26* 18  CREATININE 1.04* 1.05*  --  0.83 1.12*  CALCIUM 7.6* 7.9*  --  7.5* 7.6*  MG  --   --  1.4*  --   --    GFR: Estimated Creatinine Clearance: 37.3 mL/min (by C-G formula based on Cr of 1.12).   Liver Function Tests:  Recent Labs Lab 12/11/15 0453 12/13/15 0315 12/16/15 0522  AST 11* 10* 10*  ALT 10* 7* 7*  ALKPHOS 78 78 74  BILITOT 0.8 0.6 0.6  PROT 5.2* 5.7* 4.9*  ALBUMIN 1.2* 1.3* 1.1*   CBG:  Recent Labs Lab 12/15/15 2025 12/16/15 0017 12/16/15 0400 12/16/15 0807 12/16/15 1222  GLUCAP 113* 89 104* 102* 94    Recent Results (from the past 240 hour(s))  Culture, blood (Routine X 2) w Reflex to ID Panel     Status: Abnormal   Collection Time: 12/09/15 12:00 PM  Result Value Ref Range Status   Specimen Description BLOOD RIGHT ARM  Final   Special Requests IN PEDIATRIC BOTTLE 3CC  Final   Culture  Setup Time   Final    GRAM POSITIVE COCCI IN CLUSTERS IN PEDIATRIC BOTTLE CRITICAL RESULT CALLED TO, READ BACK BY AND VERIFIED WITH: C Ladora A T 0904 12/10/15 BY L BENFIELD    Culture (A)  Final    STAPHYLOCOCCUS SPECIES (COAGULASE NEGATIVE) THE SIGNIFICANCE OF ISOLATING THIS ORGANISM FROM A SINGLE SET OF BLOOD CULTURES WHEN MULTIPLE SETS  ARE DRAWN IS UNCERTAIN. PLEASE NOTIFY THE MICROBIOLOGY DEPARTMENT WITHIN ONE WEEK IF SPECIATION AND SENSITIVITIES ARE REQUIRED.    Report Status 12/12/2015 FINAL  Final  Culture, blood (routine x 2)     Status: None   Collection Time: 12/09/15 12:20 PM  Result Value Ref Range Status   Specimen Description BLOOD RIGHT HAND  Final   Special Requests IN PEDIATRIC BOTTLE 3CC  Final  Culture NO GROWTH 5 DAYS  Final   Report Status 12/14/2015 FINAL  Final  Culture, blood (routine x 2)     Status: None   Collection Time: 12/09/15  3:45 PM  Result Value Ref Range Status   Specimen Description BLOOD LEFT ARM  Final   Special Requests IN PEDIATRIC BOTTLE 1.5CC  Final   Culture NO GROWTH 5 DAYS  Final   Report Status 12/14/2015 FINAL  Final  Culture, blood (routine x 2)     Status: None (Preliminary result)   Collection Time: 12/15/15  4:00 PM  Result Value Ref Range Status   Specimen Description BLOOD LEFT WRIST  Final   Special Requests IN PEDIATRIC BOTTLE 2.5CC  Final   Culture NO GROWTH < 24 HOURS  Final   Report Status PENDING  Incomplete      Scheduled Meds: . antiseptic oral rinse  7 mL Mouth Rinse QID  . ceftazidime avibactam (AVYCAZ) IVPB  1.25 g Intravenous Q8H  . chlorhexidine gluconate (SAGE KIT)  15 mL Mouth Rinse BID  . collagenase   Topical Daily  . famotidine (PEPCID) IV  20 mg Intravenous Q12H  . heparin  5,000 Units Subcutaneous Q8H  . insulin aspart  0-9 Units Subcutaneous Q4H  . lidocaine      . metronidazole  500 mg Intravenous Q8H  . vancomycin  750 mg Intravenous Q36H   Continuous Infusions: . dextrose 5 % with kcl 50 mL/hr at 12/16/15 0551     LOS: 12 days    Time spent: 25 mins  Cherene Altes, MD [facility] Office  [phone] Pager - Text Page per Shea Evans as per below:  On-Call/Text Page:      [email or web address]      password [email or web address]  If 7PM-7AM, please contact night-coverage [email or web address] Password [facility] 12/16/2015, 3:58 PM

## 2015-12-16 NOTE — Progress Notes (Signed)
Pt transported to/from IR via vent w/ no apparent complications.   

## 2015-12-16 NOTE — Sedation Documentation (Signed)
Pt does no need sedation; Gtube placed thru present "track" already there. VS stable. Pt will go back to room

## 2015-12-17 DIAGNOSIS — Z930 Tracheostomy status: Secondary | ICD-10-CM | POA: Diagnosis present

## 2015-12-17 DIAGNOSIS — J151 Pneumonia due to Pseudomonas: Secondary | ICD-10-CM | POA: Diagnosis present

## 2015-12-17 DIAGNOSIS — Z9911 Dependence on respirator [ventilator] status: Secondary | ICD-10-CM | POA: Diagnosis present

## 2015-12-17 LAB — PHOSPHORUS: PHOSPHORUS: 3.4 mg/dL (ref 2.5–4.6)

## 2015-12-17 LAB — URINALYSIS, ROUTINE W REFLEX MICROSCOPIC
BILIRUBIN URINE: NEGATIVE
GLUCOSE, UA: NEGATIVE mg/dL
Ketones, ur: NEGATIVE mg/dL
Nitrite: NEGATIVE
Protein, ur: 100 mg/dL — AB
SPECIFIC GRAVITY, URINE: 1.013 (ref 1.005–1.030)
pH: 5.5 (ref 5.0–8.0)

## 2015-12-17 LAB — GLUCOSE, CAPILLARY
GLUCOSE-CAPILLARY: 140 mg/dL — AB (ref 65–99)
Glucose-Capillary: 109 mg/dL — ABNORMAL HIGH (ref 65–99)
Glucose-Capillary: 150 mg/dL — ABNORMAL HIGH (ref 65–99)
Glucose-Capillary: 155 mg/dL — ABNORMAL HIGH (ref 65–99)
Glucose-Capillary: 145 mg/dL — ABNORMAL HIGH (ref 65–99)

## 2015-12-17 LAB — CBC
HCT: 28 % — ABNORMAL LOW (ref 36.0–46.0)
HEMOGLOBIN: 8.5 g/dL — AB (ref 12.0–15.0)
MCH: 27.5 pg (ref 26.0–34.0)
MCHC: 30.4 g/dL (ref 30.0–36.0)
MCV: 90.6 fL (ref 78.0–100.0)
PLATELETS: 293 10*3/uL (ref 150–400)
RBC: 3.09 MIL/uL — AB (ref 3.87–5.11)
RDW: 21.2 % — ABNORMAL HIGH (ref 11.5–15.5)
WBC: 11.1 10*3/uL — ABNORMAL HIGH (ref 4.0–10.5)

## 2015-12-17 LAB — URINE MICROSCOPIC-ADD ON: SQUAMOUS EPITHELIAL / LPF: NONE SEEN

## 2015-12-17 LAB — COMPREHENSIVE METABOLIC PANEL
ALK PHOS: 82 U/L (ref 38–126)
ALT: 6 U/L — AB (ref 14–54)
ANION GAP: 7 (ref 5–15)
AST: 11 U/L — ABNORMAL LOW (ref 15–41)
Albumin: 1.1 g/dL — ABNORMAL LOW (ref 3.5–5.0)
BILIRUBIN TOTAL: 0.3 mg/dL (ref 0.3–1.2)
BUN: 21 mg/dL — ABNORMAL HIGH (ref 6–20)
CALCIUM: 7.5 mg/dL — AB (ref 8.9–10.3)
CO2: 23 mmol/L (ref 22–32)
CREATININE: 1.16 mg/dL — AB (ref 0.44–1.00)
Chloride: 112 mmol/L — ABNORMAL HIGH (ref 101–111)
GFR calc non Af Amer: 47 mL/min — ABNORMAL LOW (ref >60)
GFR, EST AFRICAN AMERICAN: 54 mL/min — AB (ref >60)
GLUCOSE: 129 mg/dL — AB (ref 65–99)
Potassium: 5.1 mmol/L (ref 3.5–5.1)
Sodium: 142 mmol/L (ref 135–145)
TOTAL PROTEIN: 5 g/dL — AB (ref 6.5–8.1)

## 2015-12-17 LAB — MAGNESIUM: MAGNESIUM: 1.1 mg/dL — AB (ref 1.7–2.4)

## 2015-12-17 NOTE — Progress Notes (Signed)
[place] TEAM 1 - Stepdown/ICU TEAM Progress Note  Kristen Arnold SWH:[id number] DOB: May 09, 1946 DOA: 12/04/2015 PCP: Verneita Griffes, MD  Admit HPI / Brief Narrative: 70 y.o. female WF PMHx Tracheostomy after failure to wean from vent following right hemicolectomy for bowel obstruction in December 2016. She had a hospitalization to [facility] ICU 09/26/15 through 10/01/15 for aspiration PNA. During that admission she failed a swallow eval so her chronic G tube was exchanged for J tube by IR. Blood cultures that admission were noteable for coag neg staph and pseudomonas aeruginosa, sputum cultures were also noteable for pseudomonas (sputum resistant to both imipenem and zosyn; sputum and blood were both only sensitive to cipro). Due to her hx of C.diff, she was started on oral vanc per recs from ID. Following her course of abx, she was discharged back to [facility].  On 05/04, she was brought to [facility] ED due to reports of AMS w/ minimal responsiveness x 24 hours. In the ED she was found to have septic shock presumed due to LLL HCAP as well as anemia with Hgb 6.1. She was started on levophed and 2u PRBC were ordered.  HPI/Subjective: 5/17 opens eyes to sternal rub. Does not follow commands, .  Assessment/Plan: Chronic tracheostomy / vent dependence -Care per PCCM  -After long discussion with [facility] M and Palliative Care and Family patient will continue on chronic trach  LLL HCAP w/ septic shock  -Required levophed earlier in hospital stay -Off pressors - Hx pseudomonal and coag neg staph bacteremia Feb  Polymicrobial bacteremia with Enterococcus, CRE klebsiella, pseudomonas  -Abx per ID    LLL collapse  -Care per PCCM  -ongoing chest PT   C diff colitis -On Vanc per ID   COPD -No evidence of acute exacerbation at this time   Anemia - acute on chronic Hgb presently stable   Hx DVT  Sacral decubitus ulcer stage IV Care as per WOC Team  Altered mental status  -likely due to  sepsis and severe uremia, clearing.   Hx anxiety / depression + chronic pain  AKI - in setting of shock -Cr slowly improving  Nutrition -Placed or for CorTrack placement and nutrition consult; addendum unsuccessful attempt to place CorTrack. Have requested IR place NG tube.  Hypernatremia -D5W+ KCl 40 mEq at 45m/hr  Hypokalemia -See hypernatremia   DM Type 2  -Sensitive SSI    Bilateral hand contracture -Lt>> Right have requested hand splints  Bilateral heel erythema -Requested soft boots   Goals of care -Per Palliative Care note 5/8 Son Kristen Corneaendorses full comfort care. I explained that the medical team felt it would be appropriate to allow for a natural death to occur by discontinuing mechanical ventilation and keeping her comfortable with sedation and pain medication. He agrees that this is what he would want and the wants her suffering to end -See chronic tracheostomy   Code Status: FULL Family Communication: no family present at time of exam Disposition Plan: Vent SNF pending    Consultants: ID PCCM Palliative Care   Procedure/Significant Events: 5/4 Admitted with septic shock presumed due to HCAP as well as anemia (Hgb 6.1) 5/6 Lt lung collapse, peep, dnr 5/7 some improved aeration left   Culture   Antibiotics: Vanc 05/04 >> Zosyn 05/04 >> Cipro 05/04 >>   DVT prophylaxis: Subcutaneous heparin   Devices    LINES / TUBES:  CVL L IJ 5/04 >    Continuous Infusions: . sodium chloride 10 mL/hr at 12/16/15 1831  .  feeding supplement (JEVITY 1.2 CAL) 40 mL/hr (12/16/15 1818)    Objective: VITAL SIGNS: Temp: 97.5 F (36.4 C) (05/17 1200) Temp Source: Axillary (05/17 1200) BP: 130/70 mmHg (05/17 1200) Pulse Rate: 89 (05/17 1200) SPO2; FIO2:   Intake/Output Summary (Last 24 hours) at 12/17/15 1553 Last data filed at 12/17/15 1034  Gross per 24 hour  Intake 928.34 ml  Output    725 ml  Net 203.34 ml     Exam: General:  Patient opens Eyes with sternal rub follows no commands Chronic respiratory distress on trach collar  Eyes: negative scleral hemorrhage, negative icterus ENT: Negative Runny nose, negative gingival bleeding, Neck:  Negative scars, masses, torticollis, lymphadenopathy, JVD, trachea tracheostomy in place, negative bleeding or signs of infection. Lungs: diffuse rhonchi, with thick yellow/white sputum without wheezes or crackles Cardiovascular: Regular rate and rhythm without murmur gallop or rub normal S1 and S2 Abdomen: Ostomy in place draining brownish fluid, negative abdominal pain to palpation  , nondistended, positive hypoactive bowel sounds, no rebound, no ascites, no appreciable mass, PEG tube in place Extremities: No significant cyanosis, clubbing. Bilateral hand contracture Lt>>Rt. Bilateral heel erythema  Psychiatric:  Unable to assess secondary to tracheostomy  Neurologic:  Follows no commands, does grimace in pain when extremities are moved.   Data Reviewed: Basic Metabolic Panel:  Recent Labs Lab 12/11/15 0453 12/11/15 1140 12/13/15 0315 12/16/15 0522 12/17/15 0514  NA 155*  --  146* 141 142  K 3.3*  --  3.9 5.0 5.1  CL 118*  --  115* 111 112*  CO2 25  --  '24 22 23  '$ GLUCOSE 132*  --  132* 94 129*  BUN 45*  --  26* 18 21*  CREATININE 1.05*  --  0.83 1.12* 1.16*  CALCIUM 7.9*  --  7.5* 7.6* 7.5*  MG  --  1.4*  --   --  1.1*  PHOS  --   --   --   --  3.4   Liver Function Tests:  Recent Labs Lab 12/11/15 0453 12/13/15 0315 12/16/15 0522 12/17/15 0514  AST 11* 10* 10* 11*  ALT 10* 7* 7* 6*  ALKPHOS 78 78 74 82  BILITOT 0.8 0.6 0.6 0.3  PROT 5.2* 5.7* 4.9* 5.0*  ALBUMIN 1.2* 1.3* 1.1* 1.1*   No results for input(s): LIPASE, AMYLASE in the last 168 hours. No results for input(s): AMMONIA in the last 168 hours. CBC:  Recent Labs Lab 12/11/15 0453 12/13/15 0315 12/16/15 0522 12/17/15 0514  WBC 13.2* 14.1* 10.7* 11.1*  HGB 8.4* 9.4* 8.4* 8.5*  HCT 28.7*  30.4* 27.2* 28.0*  MCV 90.5 90.5 89.2 90.6  PLT 579* 460* 345 293   Cardiac Enzymes: No results for input(s): CKTOTAL, CKMB, CKMBINDEX, TROPONINI in the last 168 hours. BNP (last 3 results) No results for input(s): BNP in the last 8760 hours.  ProBNP (last 3 results) No results for input(s): PROBNP in the last 8760 hours.  CBG:  Recent Labs Lab 12/16/15 2040 12/16/15 2324 12/17/15 0427 12/17/15 0828 12/17/15 1150  GLUCAP 123* 132* 109* 150* 155*    Recent Results (from the past 240 hour(s))  Culture, blood (Routine X 2) w Reflex to ID Panel     Status: Abnormal   Collection Time: 12/09/15 12:00 PM  Result Value Ref Range Status   Specimen Description BLOOD RIGHT ARM  Final   Special Requests IN PEDIATRIC BOTTLE 3CC  Final   Culture  Setup Time   Final  GRAM POSITIVE COCCI IN CLUSTERS IN PEDIATRIC BOTTLE CRITICAL RESULT CALLED TO, READ BACK BY AND VERIFIED WITH: C WHITE,RN A T 0904 12/10/15 BY L BENFIELD    Culture (A)  Final    STAPHYLOCOCCUS SPECIES (COAGULASE NEGATIVE) THE SIGNIFICANCE OF ISOLATING THIS ORGANISM FROM A SINGLE SET OF BLOOD CULTURES WHEN MULTIPLE SETS ARE DRAWN IS UNCERTAIN. PLEASE NOTIFY THE MICROBIOLOGY DEPARTMENT WITHIN ONE WEEK IF SPECIATION AND SENSITIVITIES ARE REQUIRED.    Report Status 12/12/2015 FINAL  Final  Culture, blood (routine x 2)     Status: None   Collection Time: 12/09/15 12:20 PM  Result Value Ref Range Status   Specimen Description BLOOD RIGHT HAND  Final   Special Requests IN PEDIATRIC BOTTLE 3CC  Final   Culture NO GROWTH 5 DAYS  Final   Report Status 12/14/2015 FINAL  Final  Culture, blood (routine x 2)     Status: None   Collection Time: 12/09/15  3:45 PM  Result Value Ref Range Status   Specimen Description BLOOD LEFT ARM  Final   Special Requests IN PEDIATRIC BOTTLE 1.5CC  Final   Culture NO GROWTH 5 DAYS  Final   Report Status 12/14/2015 FINAL  Final  Culture, blood (routine x 2)     Status: None (Preliminary result)     Collection Time: 12/15/15  4:00 PM  Result Value Ref Range Status   Specimen Description BLOOD LEFT WRIST  Final   Special Requests IN PEDIATRIC BOTTLE 2.5CC  Final   Culture NO GROWTH 2 DAYS  Final   Report Status PENDING  Incomplete     Studies:  Recent x-ray studies have been reviewed in detail by the Attending Physician  Scheduled Meds:  Scheduled Meds: . antiseptic oral rinse  7 mL Mouth Rinse QID  . ceftazidime avibactam (AVYCAZ) IVPB  1.25 g Intravenous Q8H  . chlorhexidine gluconate (SAGE KIT)  15 mL Mouth Rinse BID  . collagenase   Topical Daily  . famotidine  20 mg Per Tube BID  . feeding supplement (PRO-STAT SUGAR FREE 64)  30 mL Per Tube BID  . heparin  5,000 Units Subcutaneous Q8H  . insulin aspart  0-9 Units Subcutaneous Q4H  . multivitamin  15 mL Per Tube Daily  . vancomycin  125 mg Per Tube QID    Time spent on care of this patient: 40 mins   Kristen Arnold, Kristen Arnold , MD  [facility] Office  [phone] Pager - [phone]  On-Call/Text Page:      [email or web address]      password [email or web address]  If 7PM-7AM, please contact night-coverage [email or web address] Password [email or web address] 12/17/2015, 3:53 PM   LOS: 13 days   Care during the described time interval was provided by me .  I have reviewed this patient's available data, including medical history, events of note, physical examination, and all test results as part of my evaluation. I have personally reviewed and interpreted all radiology studies.   Dia Crawford, MD [phone] Pager

## 2015-12-17 NOTE — Progress Notes (Signed)
[facility] will be able to accept pt back on Friday  No other Vent beds available at this time  CSW will continue to follow  Merlyn LotJenna Holoman, [facility] LL[facility]SWA Clinical Social Worker [phone]

## 2015-12-17 NOTE — Progress Notes (Signed)
[facility]s MD notified of UA results. Will await new orders if appropriate and continue to monitor patient closely.

## 2015-12-18 LAB — GLUCOSE, CAPILLARY
GLUCOSE-CAPILLARY: 118 mg/dL — AB (ref 65–99)
Glucose-Capillary: 132 mg/dL — ABNORMAL HIGH (ref 65–99)
Glucose-Capillary: 135 mg/dL — ABNORMAL HIGH (ref 65–99)
Glucose-Capillary: 142 mg/dL — ABNORMAL HIGH (ref 65–99)
Glucose-Capillary: 144 mg/dL — ABNORMAL HIGH (ref 65–99)

## 2015-12-18 LAB — COMPREHENSIVE METABOLIC PANEL
ALT: 5 U/L — ABNORMAL LOW (ref 14–54)
ANION GAP: 10 (ref 5–15)
AST: 10 U/L — AB (ref 15–41)
Albumin: 1 g/dL — ABNORMAL LOW (ref 3.5–5.0)
Alkaline Phosphatase: 96 U/L (ref 38–126)
BILIRUBIN TOTAL: 0.6 mg/dL (ref 0.3–1.2)
BUN: 30 mg/dL — AB (ref 6–20)
CO2: 22 mmol/L (ref 22–32)
Calcium: 7.5 mg/dL — ABNORMAL LOW (ref 8.9–10.3)
Chloride: 107 mmol/L (ref 101–111)
Creatinine, Ser: 1.32 mg/dL — ABNORMAL HIGH (ref 0.44–1.00)
GFR calc Af Amer: 46 mL/min — ABNORMAL LOW (ref >60)
GFR calc non Af Amer: 40 mL/min — ABNORMAL LOW (ref >60)
Glucose, Bld: 126 mg/dL — ABNORMAL HIGH (ref 65–99)
POTASSIUM: 4.7 mmol/L (ref 3.5–5.1)
Sodium: 139 mmol/L (ref 135–145)
TOTAL PROTEIN: 4.9 g/dL — AB (ref 6.5–8.1)

## 2015-12-18 LAB — CBC WITH DIFFERENTIAL/PLATELET
Basophils Absolute: 0.1 10*3/uL (ref 0.0–0.1)
Basophils Relative: 0 %
EOS PCT: 3 %
Eosinophils Absolute: 0.4 10*3/uL (ref 0.0–0.7)
HEMATOCRIT: 28.9 % — AB (ref 36.0–46.0)
HEMOGLOBIN: 9.2 g/dL — AB (ref 12.0–15.0)
LYMPHS ABS: 2.1 10*3/uL (ref 0.7–4.0)
LYMPHS PCT: 13 %
MCH: 29.4 pg (ref 26.0–34.0)
MCHC: 31.8 g/dL (ref 30.0–36.0)
MCV: 92.3 fL (ref 78.0–100.0)
Monocytes Absolute: 0.9 10*3/uL (ref 0.1–1.0)
Monocytes Relative: 6 %
NEUTROS ABS: 12.5 10*3/uL — AB (ref 1.7–7.7)
Neutrophils Relative %: 78 %
PLATELETS: 256 10*3/uL (ref 150–400)
RBC: 3.13 MIL/uL — AB (ref 3.87–5.11)
RDW: 21.2 % — AB (ref 11.5–15.5)
WBC: 15.9 10*3/uL — AB (ref 4.0–10.5)

## 2015-12-18 LAB — PROTIME-INR
INR: 1.44 (ref 0.00–1.49)
PROTHROMBIN TIME: 17.6 s — AB (ref 11.6–15.2)

## 2015-12-18 MED ORDER — PRO-STAT SUGAR FREE PO LIQD: 30.0000 mL | Freq: Two times a day (BID) | ORAL | Status: AC

## 2015-12-18 MED ORDER — LORAZEPAM 2 MG/ML IJ SOLN: 0.5000 mg | INTRAMUSCULAR | Status: AC | PRN

## 2015-12-18 MED ORDER — HEPARIN SODIUM (PORCINE) 5000 UNIT/ML IJ SOLN: 5000.0000 [IU] | Freq: Three times a day (TID) | INTRAMUSCULAR | Status: AC

## 2015-12-18 MED ORDER — INSULIN ASPART 100 UNIT/ML ~~LOC~~ SOLN: 0.0000 [IU] | SUBCUTANEOUS | Status: AC

## 2015-12-18 MED ORDER — COLLAGENASE 250 UNIT/GM EX OINT: TOPICAL_OINTMENT | Freq: Every day | CUTANEOUS | Status: AC

## 2015-12-18 MED ORDER — JEVITY 1.2 CAL PO LIQD: 1000.0000 mL | ORAL | Status: AC

## 2015-12-18 MED ORDER — MORPHINE SULFATE (PF) 2 MG/ML IV SOLN: 2.0000 mg | INTRAVENOUS | Status: AC | PRN

## 2015-12-18 MED ORDER — VANCOMYCIN 50 MG/ML ORAL SOLUTION: 125.0000 mg | Freq: Four times a day (QID) | ORAL | Status: AC

## 2015-12-18 MED ORDER — ADULT MULTIVITAMIN LIQUID CH: 15.0000 mL | Freq: Every day | ORAL | Status: AC

## 2015-12-18 NOTE — Progress Notes (Signed)
Patient will discharge to [facility] Anticipated discharge date: 5/18 Family notified: informed pt brother and left message with pt son [facility]- scheduled for 4pm  CSW signing off.  Merlyn LotJenna Holoman, LCSWA Clinical Social Worker [phone]

## 2015-12-18 NOTE — Progress Notes (Signed)
Patient is being discharged back to [facility].  [facility]a Williams, LPN.  She will be tansported via [facility].

## 2015-12-18 NOTE — Progress Notes (Signed)
Orthopedic Tech Progress Note Patient Details:  Wendie ChessJudy Sawhney 10/17/1945 [id number]  Ortho Devices Type of Ortho Device: Velcro wrist splint Ortho Device/Splint Location: bi- velcro wrist splints Ortho Device/Splint Interventions: Ordered, Application   Trinna PostMartinez, Rozalynn Buege J 12/18/2015, 6:40 AM

## 2015-12-18 NOTE — Discharge Summary (Signed)
Physician Discharge Summary  Jewell Haught ZOX:[id number] DOB: 1946-07-17 DOA: 12/04/2015  PCP: Hillary Bow, MD  Admit date: 12/04/2015 Discharge date: 12/18/2015  Time spent: 40 minutes  Recommendations for Outpatient Follow-up:  Chronic tracheostomy / vent dependence -Chronic tracheostomy care per facility protocol  LLL HCAP w/ septic shock  -Required levophed earlier in hospital stay -Off pressors -See MDR bacteremia  MDR Bacteremia with Enterococcus, CRE klebsiella, pseudomonas  -stop gent at day 7 (5-15) -Continue avycaz (for pseudo and kleb), vanco (for enterococcous R-amp) for 2 weeks total (stop 5-18)   LLL collapse  -ongoing chest PT   C diff colitis -Would give her prolonged po vanco taper, subtracting days she is on flagyl from this.  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 -Wound care per facility protocol  -Has completed course of antibiotics   Altered mental status  -Most likely at baseline   Hx anxiety / depression + chronic pain -Not pertinent currently  AKI - in setting of shock -Cr slowly improving Lab Results  Component Value Date   CREATININE 1.32* 12/18/2015   CREATININE 1.16* 12/17/2015   CREATININE 1.12* 12/16/2015  -Most likely at baseline  Nutrition -PEG tube in place -Continue.Jevity 1.2 CAL@ 73ml/hr -Continue for stat sugar free 64,  30ml BID  DM Type 2  -Sensitive SSI   Bilateral hand contracture -Lt>> Right have requested hand splints  Bilateral heel erythema -Requested soft boots  Discharge Diagnoses:  Active Problems:   Sepsis (HCC)   Encounter for central line placement   Clostridium difficile  colitis   CRKP (carbapenem-resistant Klebsiella pneumoniae) infection   Palliative care encounter   Acute respiratory failure (HCC)   Bacteremia due to Enterococcus   Bacteremia due to Klebsiella pneumoniae   Bacteremia due to Pseudomonas   C. difficile colitis   Dysphagia   Sepsis due to Pseudomonas (HCC)   Tracheostomy present (HCC)   Ventilator dependent (HCC)   Pneumonia of both lungs due to Pseudomonas species [facility])   Discharge Condition: Guarded  Diet recommendation: NPO/Jevity 1.2 CAL@ 54ml/hr  Filed Weights   12/16/15 0500 12/17/15 0400 12/18/15 0243  Weight: 58.3 kg (128 lb 8.5 oz) 58.4 kg (128 lb 12 oz) 60.2 kg (132 lb 11.5 oz)    History of present illness:  70 y.o. female WF PMHx Tracheostomy after failure to wean from vent following right hemicolectomy for bowel obstruction in December 2016. She had a hospitalization to [facility] ICU 09/26/15 through 10/01/15 for aspiration PNA. During that admission she failed a swallow eval so her chronic G tube was exchanged for J tube by IR. Blood cultures that admission were noteable for coag neg staph and pseudomonas aeruginosa, sputum cultures were also noteable for pseudomonas (sputum resistant to both imipenem and zosyn; sputum and blood were both only sensitive to cipro). Due to her hx of C.diff, she was started on oral vanc per recs from ID. Following her course of abx, she was discharged back to [facility]  On 05/04, she was brought to [facility] ED due to reports of AMS w/ minimal responsiveness x 24 hours. In the ED she was found to have septic shock presumed  due to LLL HCAP as well as anemia with Hgb 6.1. She was started on levophed and 2u PRBC were ordered During his hospitalization patient was treated for multidrug resistant HCAP (resulting in tracheostomy with ventilator dependence), multidrug resistant bacteremia, and C. difficile colitis. In addition patient's hospitalization Complicated by acute kidney injury. Patient has been  treated by extended course of antibiotics which will continue upon her discharge. Family understands patient's condition very precarious and likelihood of extended period of survival slim.   Consultants: ID PCCM Palliative Care   Procedure/Significant Events: 5/4 Admitted with septic shock presumed due to HCAP as well as anemia (Hgb 6.1) 5/6 Lt lung collapse, peep, dnr 5/7 some improved aeration left   Culture 5/4 blood PICC line 2 positive MDR Klebsiella Pneumonia, MDR Pseudomonas Aeruginosa,Carbapenemase resistant Enterococcus  5/4 urine negative final 5/5 MRSA by PCR positive 5/5 sacral wound MDR Pseudomonas Aeruginosa 5/9 positive coag negative staph (most likely contaminant) 5/9 blood right hand/left arm negative final 5/15 blood left wrist NGTD    Antibiotics:  Ceftaz 5/5>> 5/10 Avycaz 5/10>> 5/18 Ciprofloxacin 5/41 dose Gentamicin 5/8>> 5/15 Metronidazole 5/5>> 5/16 Zosyn 5/4>> 5/5 Vanc 05/04 >> 5/18 Prolonged po vanco taper, subtracting days she is on flagyl from this.  2 weeks 125 mg, qid 1 week 125mg  bid 1 week 125mg  qday 2 weeks 125mg  qod    Discharge Exam: Filed Vitals:   12/18/15 0800 12/18/15 0807 12/18/15 1148 12/18/15 1202  BP: 118/64   133/76  Pulse: 85   111  Temp: 97.7 F (36.5 C)   97.8 F (36.6 C)  TempSrc: Oral   Oral  Resp: 23   25  Height:      Weight:      SpO2: 98% 100% 96% 97%    General: Patient opens Eyes with sternal rub follows no commands Chronic respiratory distress on trach collar  Eyes: negative scleral hemorrhage, negative icterus ENT: Negative Runny nose, negative gingival bleeding, Neck: Negative scars, masses, torticollis, lymphadenopathy, JVD, trachea tracheostomy in place, negative bleeding or signs of infection. Lungs: diffuse rhonchi, with thick yellow/white sputum without wheezes or crackles Cardiovascular: Regular rate and rhythm without murmur gallop or rub normal S1 and S2 Abdomen: Ostomy in place  draining brownish fluid, negative abdominal pain to palpation , nondistended, positive hypoactive bowel sounds, no rebound, no ascites, no appreciable mass, PEG tube in place Extremities: No significant cyanosis, clubbing. Bilateral hand contracture Lt>>Rt. Bilateral heel erythema  Psychiatric: Unable to assess secondary to tracheostomy  Neurologic: Follows no commands, does grimace in pain when extremities are moved  Discharge Instructions     Medication List    STOP taking these medications        carvedilol 3.125 MG tablet  Commonly known as:  COREG     cefTRIAXone 1 g in dextrose 5 % 50 mL     ciprofloxacin 400 MG/200ML Soln  Commonly known as:  CIPRO     dextrose 5 % and 0.45% NaCl infusion     fluconazole 100-0.9 MG/50ML-% Soln IVPB  Commonly known as:  DIFLUCAN     insulin regular 100 units/mL injection  Commonly known as:  NOVOLIN R,HUMULIN R     ipratropium-albuterol 0.5-2.5 (3) MG/3ML Soln  Commonly known as:  DUONEB     LORazepam 0.5 MG tablet  Commonly known as:  ATIVAN  Replaced by:  LORazepam 2 MG/ML injection     magnesium oxide 400 MG tablet  Commonly known as:  MAG-OX     metoCLOPramide 5  MG tablet  Commonly known as:  REGLAN     sertraline 50 MG tablet  Commonly known as:  ZOLOFT     vancomycin 1-5 GM/200ML-% Soln  Commonly known as:  VANCOCIN     vitamin C 500 MG tablet  Commonly known as:  ASCORBIC ACID      TAKE these medications        acetaminophen 325 MG tablet  Commonly known as:  TYLENOL  650 mg by PEG Tube route every 4 (four) hours as needed for moderate pain.     chlorhexidine 0.12 % solution  Commonly known as:  PERIDEX  Use as directed 15 mLs in the mouth or throat 2 (two) times daily.     collagenase ointment  Commonly known as:  SANTYL  Apply topically daily.     feeding supplement (JEVITY 1.2 CAL) Liqd  Place 1,000 mLs into feeding tube continuous.     feeding supplement (PRO-STAT SUGAR FREE 64) Liqd  Place  30 mLs into feeding tube 2 (two) times daily.     heparin 5000 UNIT/ML injection  Inject 1 mL (5,000 Units total) into the skin every 8 (eight) hours.     insulin aspart 100 UNIT/ML injection  Commonly known as:  novoLOG  Inject 0-9 Units into the skin every 4 (four) hours.     LORazepam 2 MG/ML injection  Commonly known as:  ATIVAN  Inject 0.25-0.5 mLs (0.5-1 mg total) into the vein every 4 (four) hours as needed for anxiety.     memantine 5 MG tablet  Commonly known as:  NAMENDA  Take 5 mg by mouth 2 (two) times daily.     morphine 2 MG/ML injection  Inject 1 mL (2 mg total) into the vein every 4 (four) hours as needed.     multivitamin Liqd  Place 15 mLs into feeding tube daily.     pantoprazole 40 MG tablet  Commonly known as:  PROTONIX  40 mg by PEG Tube route 2 (two) times daily.     vancomycin 50 mg/mL oral solution  Commonly known as:  VANCOCIN  Place 2.5 mLs (125 mg total) into feeding tube 4 (four) times daily.       Allergies  Allergen Reactions  . Codeine     Unknown reaction, listed on MAR      The results of significant diagnostics from this hospitalization (including imaging, microbiology, ancillary and laboratory) are listed below for reference.    Significant Diagnostic Studies: Dg Abd 1 View  12/12/2015  CLINICAL DATA:  Attempted feeding tube placement. Multiple bedside failures. EXAM: ABDOMEN - 1 VIEW COMPARISON:  Abdominal CT 09/26/2015 FINDINGS: Feeding tube placement was attempted at the bedside under C-arm fluoroscopy by the radiology technologist. Guidewires were easily passed into the stomach, although the 12 French nasoenteric feeding tube would not pass over the wire past the gastroesophageal junction despite several attempts. The procedure was aborted. IMPRESSION: Twelve [place] feeding tube with not pass over the guidewire past the gastroesophageal junction. Recent CT demonstrates no large hiatal hernia. Consider distal esophageal stricture.  Electronically Signed   By: Carey Bullocks M.D.   On: 12/12/2015 15:54   Ir Replc Gastro/colonic Tube Percut W/fluoro  12/16/2015  INDICATION: Dysphagia, feeding tube replacement EXAM: GASTROSTOMY CATHETER REPLACEMENT MEDICATIONS: None. ANESTHESIA/SEDATION: Moderate Sedation Time:  None. The patient was continuously monitored during the procedure by the interventional radiology nurse under my direct supervision. CONTRAST:  10 cc - administered into the gastric lumen. FLUOROSCOPY TIME:  Fluoroscopy Time: 0 minutes 24 seconds (1 mGy). COMPLICATIONS: None immediate. PROCEDURE: Informed written consent was obtained from the patient's family after a thorough discussion of the procedural risks, benefits and alternatives. All questions were addressed. Maximal Sterile Barrier Technique was utilized including caps, mask, sterile gowns, sterile gloves, sterile drape, hand hygiene and skin antiseptic. A timeout was performed prior to the initiation of the procedure. Under sterile conditions, the existing percutaneous tract in the left abdomen was cannulated with a catheter and guidewire. Catheter was advanced into the stomach. Contrast injection confirms position. Over an Amplatz guidewire, a new 18 French balloon retention gastrostomy was advanced. The balloon tip was inflated with 9 cc saline and retracted against the anterior gastric wall. Contrast injection confirms position. This was secured externally and flushed with saline. Access ready use. IMPRESSION: Successful fluoroscopic re- insertion of an 3518 French balloon retention gastrostomy. Electronically Signed   By: Judie PetitM.  Shick M.D.   On: 12/16/2015 11:55   Dg Chest Port 1 View  12/16/2015  CLINICAL DATA:  Pneumonia, sepsis, tracheostomy patient. EXAM: PORTABLE CHEST 1 VIEW COMPARISON:  Portable chest x-ray of 03/14/2016 FINDINGS: The lungs are adequately inflated. There are bibasilar pleural effusions. There is bibasilar atelectasis or pneumonia as well. The heart  is normal in size. The pulmonary vascularity is not engorged. The interstitial markings remain increased bilaterally and on the left are more sent conspicuous today. The tracheostomy appliance tip lies at the inferior margin of the clavicular heads. The tracheostomy balloon continues to appear hyperinflated. The PICC line tip projects over the proximal SVC. IMPRESSION: Allowing for differences in positioning there has not been significant interval change. Bibasilar pneumonia and small pleural effusions are stable. The tracheostomy balloon continues to appear hyperinflated. Electronically Signed   By: David  SwazilandJordan M.D.   On: 12/16/2015 07:14   Dg Chest Port 1 View  12/13/2015  CLINICAL DATA:  Respiratory failure EXAM: PORTABLE CHEST 1 VIEW COMPARISON:  Dec 07, 2015 FINDINGS: Increased lucency around the tracheostomy tube is consistent with an overinflated cuff. This is not significantly changed in the interval. No pneumothorax. A new right PICC line terminates in the SVC. Effusion and opacity remain in the left lung base. Mild increased opacity in the left apex is more prominent in the interval. There also appears to be effusion and underlying opacity in the right base. Increased interstitial markings, right greater the left, suggest asymmetric edema. No other interval changes. IMPRESSION: 1. Continued hyperinflation of the tracheostomy cuff. New right PICC line is in good position. 2. Mild increased effusion and opacity in the left lung base. New effusion and underlying opacity in the right lung base. Mild increased opacity in the left apex. Asymmetric edema. Electronically Signed   By: Gerome Samavid  Williams III M.D   On: 12/13/2015 07:20   Dg Chest Port 1 View  12/07/2015  CLINICAL DATA:  Patient with history of pneumonia, COPD and diabetes. EXAM: PORTABLE CHEST 1 VIEW COMPARISON:  Chest radiograph 12/06/2015. FINDINGS: Tracheostomy tube terminates in the mid trachea. The cup appears to be hyperinflated. Left IJ  central venous catheter tip projects over the superior vena cava. Multiple monitoring leads overlie the patient. Stable cardiac and mediastinal contours. Interval decrease in size of now small left pleural effusion. Persistent heterogeneous opacities left lung base and left apex. No pneumothorax. IMPRESSION: Persistent overinflation of the tracheostomy cuff. Interval decrease in size of left pleural effusion with persistent left lower and upper lung airspace opacities. Electronically Signed   By: Kenard Gowerrew  Earlene Plater M.D.   On: 12/07/2015 08:07   Dg Chest Port 1 View  12/06/2015  CLINICAL DATA:  Acute respiratory failure EXAM: PORTABLE CHEST 1 VIEW COMPARISON:  Yesterday FINDINGS: Tracheostomy tube remains seated. The trachea is likely distended by the cuff. Left IJ central line with tip at the SVC level. The left upper extremity PICC is been removed. Progressive opacification of the left chest with worsened volume loss, likely interval lower lobe collapse. Left pleural effusion which could be loculated. COPD. These results will be called to the ordering clinician or representative by the Radiologist Assistant, and communication documented in the PACS or zVision Dashboard. IMPRESSION: 1. Tracheostomy tube with widened trachea suggesting cuff over inflation. 2. Interval collapse of the left lower lobe and increased pleural effusion, possibly loculated. History suggests underlying pneumonia and parapneumonic effusion. 3. COPD. Electronically Signed   By: Marnee Spring M.D.   On: 12/06/2015 07:29   Dg Chest Port 1 View  12/05/2015  CLINICAL DATA:  Central line placement EXAM: PORTABLE CHEST 1 VIEW COMPARISON:  12/04/2015 FINDINGS: Tracheostomy. Left subclavian venous catheter with tip over the mid SVC likely at the junction of the brachiocephalic vein. New right jugular venous catheter with tip over the low SVC. No pneumothorax. Normal heart size and pulmonary vascularity. Small left pleural effusion with basilar  atelectasis or infiltration. Focal airspace disease in the left upper lung. Followup after resolution of acute process is recommended to exclude underlying nodule. Probable emphysematous changes in the lungs. IMPRESSION: Appliances appear in satisfactory position. Left pleural effusion with basilar atelectasis or infiltration. Focal nodular infiltration in the left upper lung. Follow-up after resolution of acute process suggested. Electronically Signed   By: Burman Nieves M.D.   On: 12/05/2015 02:16   Dg Chest Port 1 View  12/04/2015  CLINICAL DATA:  Altered mental status.  Respiratory failure. EXAM: PORTABLE CHEST 1 VIEW COMPARISON:  09/26/2015 FINDINGS: Tracheostomy is centered on the tracheal air column. Left upper extremity PICC line extends to the expected location of the SVC just above the azygos vein junction. There is consolidation and effusion in the left base. This may represent pneumonia with parapneumonic effusion. Right lung is clear. Pulmonary vasculature is normal. Hilar and mediastinal contours are unremarkable. IMPRESSION: Left base consolidation and effusion. Suspect pneumonia. Followup PA and lateral chest X-ray is recommended in 3-4 weeks following trial of antibiotic therapy to ensure resolution and exclude underlying malignancy. Electronically Signed   By: Ellery Plunk M.D.   On: 12/04/2015 19:35   Dg Vangie Bicker G Tube Plc W/fl-no Rad  12/12/2015  CLINICAL DATA:  NASO G TUBE PLACEMENT WITH FLUORO Fluoroscopy was utilized by the requesting physician.  No radiographic interpretation.    Microbiology: Recent Results (from the past 240 hour(s))  Culture, blood (Routine X 2) w Reflex to ID Panel     Status: Abnormal   Collection Time: 12/09/15 12:00 PM  Result Value Ref Range Status   Specimen Description BLOOD RIGHT ARM  Final   Special Requests IN PEDIATRIC BOTTLE 3CC  Final   Culture  Setup Time   Final    GRAM POSITIVE COCCI IN CLUSTERS IN PEDIATRIC BOTTLE CRITICAL RESULT  CALLED TO, READ BACK BY AND VERIFIED WITH: C WHITE,RN A T 0904 12/10/15 BY L BENFIELD    Culture (A)  Final    STAPHYLOCOCCUS SPECIES (COAGULASE NEGATIVE) THE SIGNIFICANCE OF ISOLATING THIS ORGANISM FROM A SINGLE SET OF BLOOD CULTURES WHEN MULTIPLE SETS ARE DRAWN IS UNCERTAIN. PLEASE NOTIFY THE MICROBIOLOGY DEPARTMENT  WITHIN ONE WEEK IF SPECIATION AND SENSITIVITIES ARE REQUIRED.    Report Status 12/12/2015 FINAL  Final  Culture, blood (routine x 2)     Status: None   Collection Time: 12/09/15 12:20 PM  Result Value Ref Range Status   Specimen Description BLOOD RIGHT HAND  Final   Special Requests IN PEDIATRIC BOTTLE 3CC  Final   Culture NO GROWTH 5 DAYS  Final   Report Status 12/14/2015 FINAL  Final  Culture, blood (routine x 2)     Status: None   Collection Time: 12/09/15  3:45 PM  Result Value Ref Range Status   Specimen Description BLOOD LEFT ARM  Final   Special Requests IN PEDIATRIC BOTTLE 1.5CC  Final   Culture NO GROWTH 5 DAYS  Final   Report Status 12/14/2015 FINAL  Final  Culture, blood (routine x 2)     Status: None (Preliminary result)   Collection Time: 12/15/15  4:00 PM  Result Value Ref Range Status   Specimen Description BLOOD LEFT WRIST  Final   Special Requests IN PEDIATRIC BOTTLE 2.5CC  Final   Culture NO GROWTH 2 DAYS  Final   Report Status PENDING  Incomplete     Labs: Basic Metabolic Panel:  Recent Labs Lab 12/13/15 0315 12/16/15 0522 12/17/15 0514 12/18/15 0817  NA 146* 141 142 139  K 3.9 5.0 5.1 4.7  CL 115* 111 112* 107  CO2 24 22 23 22   GLUCOSE 132* 94 129* 126*  BUN 26* 18 21* 30*  CREATININE 0.83 1.12* 1.16* 1.32*  CALCIUM 7.5* 7.6* 7.5* 7.5*  MG  --   --  1.1*  --   PHOS  --   --  3.4  --    Liver Function Tests:  Recent Labs Lab 12/13/15 0315 12/16/15 0522 12/17/15 0514 12/18/15 0817  AST 10* 10* 11* 10*  ALT 7* 7* 6* 5*  ALKPHOS 78 74 82 96  BILITOT 0.6 0.6 0.3 0.6  PROT 5.7* 4.9* 5.0* 4.9*  ALBUMIN 1.3* 1.1* 1.1* 1.0*   No  results for input(s): LIPASE, AMYLASE in the last 168 hours. No results for input(s): AMMONIA in the last 168 hours. CBC:  Recent Labs Lab 12/13/15 0315 12/16/15 0522 12/17/15 0514 12/18/15 0817  WBC 14.1* 10.7* 11.1* 15.9*  NEUTROABS  --   --   --  12.5*  HGB 9.4* 8.4* 8.5* 9.2*  HCT 30.4* 27.2* 28.0* 28.9*  MCV 90.5 89.2 90.6 92.3  PLT 460* 345 293 256   Cardiac Enzymes: No results for input(s): CKTOTAL, CKMB, CKMBINDEX, TROPONINI in the last 168 hours. BNP: BNP (last 3 results) No results for input(s): BNP in the last 8760 hours.  ProBNP (last 3 results) No results for input(s): PROBNP in the last 8760 hours.  CBG:  Recent Labs Lab 12/17/15 1150 12/17/15 1630 12/17/15 2008 12/18/15 0327 12/18/15 0835  GLUCAP 155* 140* 145* 135* 118*       Signed:  Carolyne Littles, MD [facility] [phone] pager

## 2015-12-18 NOTE — Progress Notes (Signed)
Mr. Eliott NineRandolph Gerr, patient's son, called and inquired regarding patient.  Informed him that Ms. Kempfer is being discharged back to [facility] today.

## 2015-12-18 NOTE — Progress Notes (Signed)
Orthopedic Tech Progress Note Patient Details:  Kristen Arnold 03/23/1946 [id number] (B) resting hand splints completed by bio-tech vendor. Patient ID: Kristen Arnold, female   DOB: 02/08/1946, 70 y.o.   MRN: [id number]   [name], [name] 12/18/2015, 5:05 PM

## 2015-12-18 NOTE — Progress Notes (Signed)
Orthopedic Tech Progress Note Patient Details:  Kristen Arnold Pelle 01/18/1946 [id number]  Patient ID: Kristen Arnold Anfinson, female   DOB: 12/13/1945, 70 y.o.   MRN: [id number] Called in bio-tech brace order; spoke with Tedd SiasErin  Lael Wetherbee 12/18/2015, 4:05 PM

## 2015-12-20 LAB — CULTURE, BLOOD (ROUTINE X 2): Culture: NO GROWTH

## 2016-01-31 DEATH — deceased

## 2016-12-07 IMAGING — RF DG ABDOMEN 1V
1 series · 2 of 2 positions shown · non-contrast
Comparison: Abdominal CT 09/26/2015

CLINICAL DATA: Attempted feeding tube placement. Multiple bedside
failures.

EXAM:
ABDOMEN - 1 VIEW

[Series 1: run · 2 of 2 slices shown]
[im 1/2]
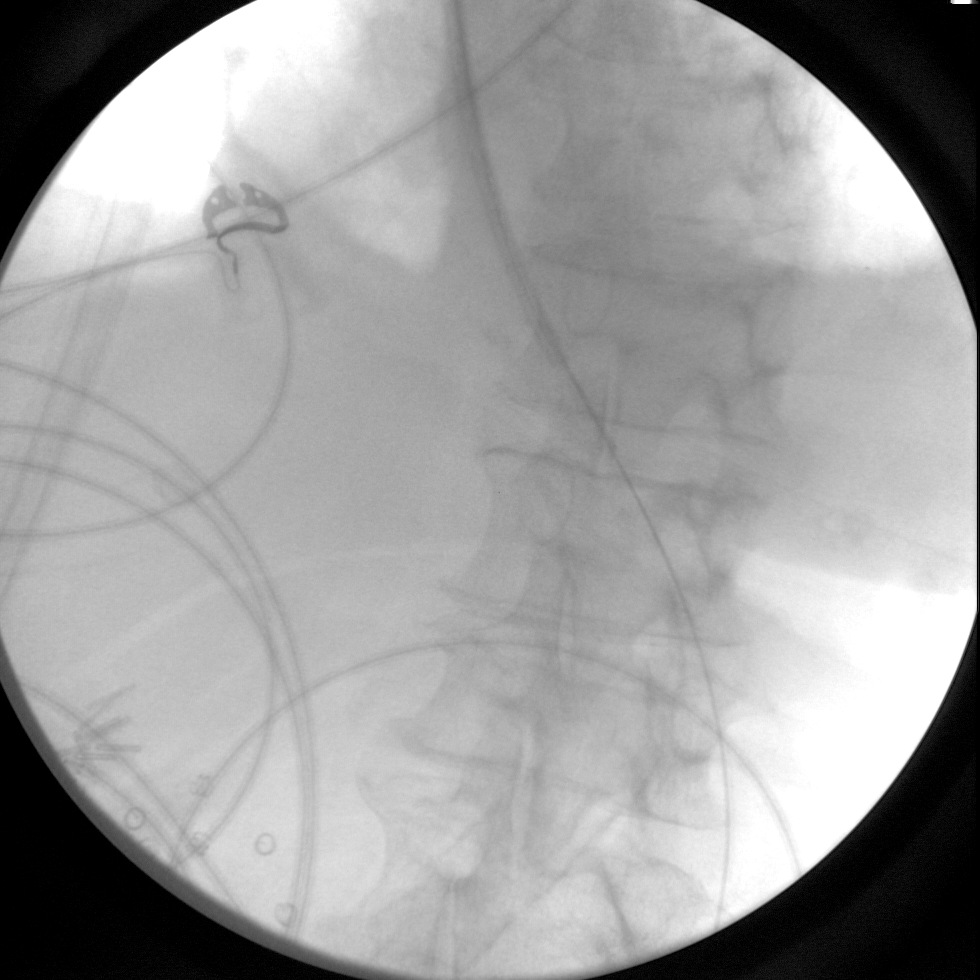
[im 2/2]
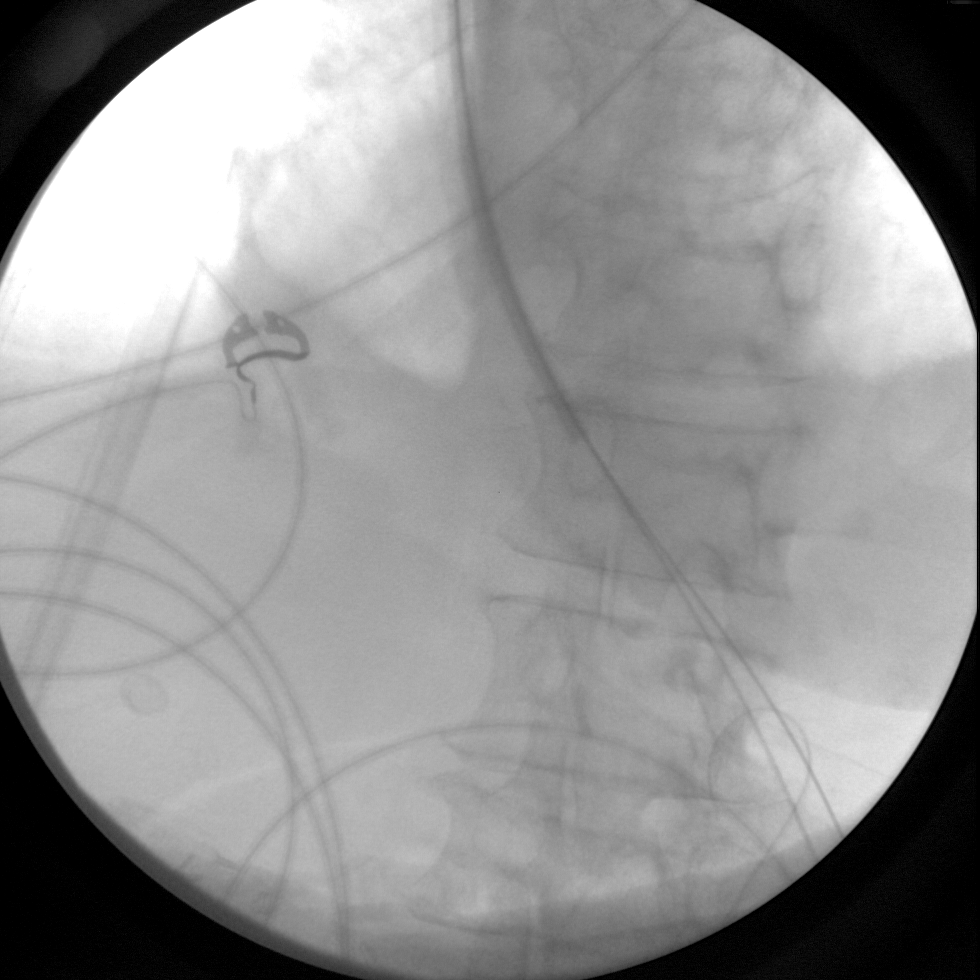

[2 of 2 positions shown; findings below may reference images not displayed]

FINDINGS: Feeding tube placement was attempted at the bedside under C-arm
fluoroscopy by the radiology technologist. Guidewires were easily
passed into the stomach, although the 12 French nasoenteric feeding
tube would not pass over the wire past the gastroesophageal junction
despite several attempts. The procedure was aborted.
IMPRESSION: Twelve French feeding tube with not pass over the guidewire past the
gastroesophageal junction. Recent CT demonstrates no large hiatal
hernia. Consider distal esophageal stricture.

## 2016-12-11 IMAGING — XA IR REPLACE G-TUBE/COLONIC TUBE
2 series · 8 of 8 positions shown · non-contrast
Comparison: none

INDICATION: Dysphagia, feeding tube replacement

[Series 1: fl (-) angio · 4 of 87 frames shown (1 of 2)]
[frame 1/87]
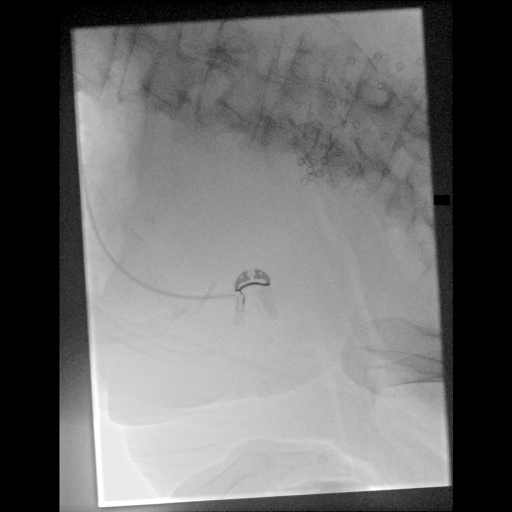
[frame 14/87]
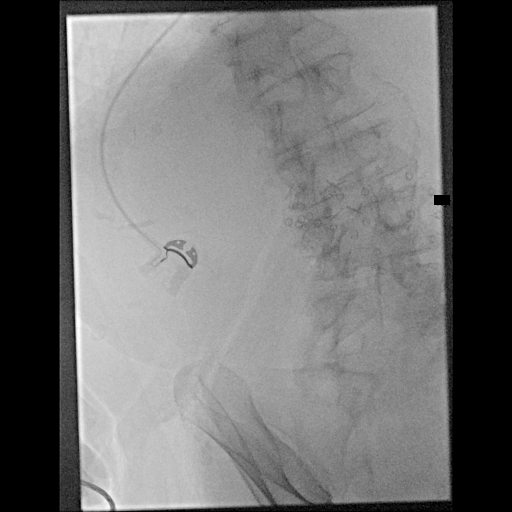
[frame 44/87]
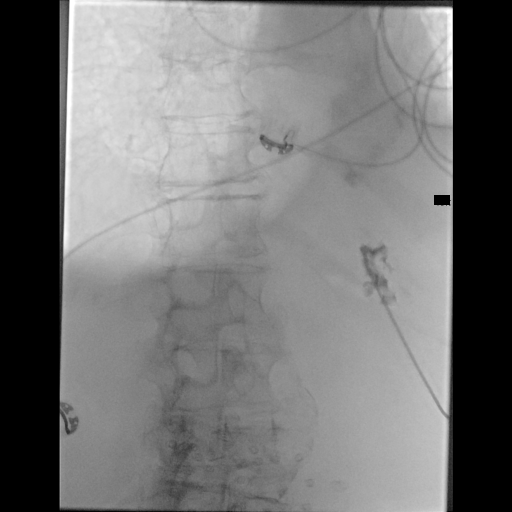
[frame 74/87]
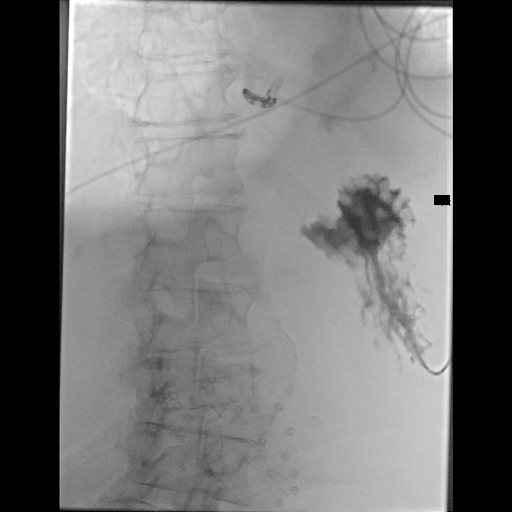

[Series 2: fl (-) angio · 4 of 14 frames shown (2 of 2)]
[frame 3/14]
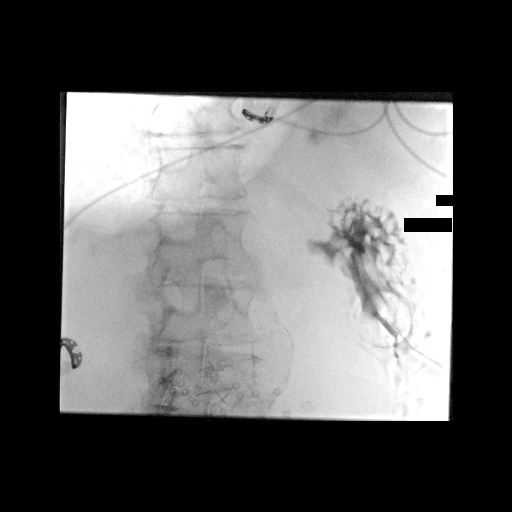
[frame 8/14]
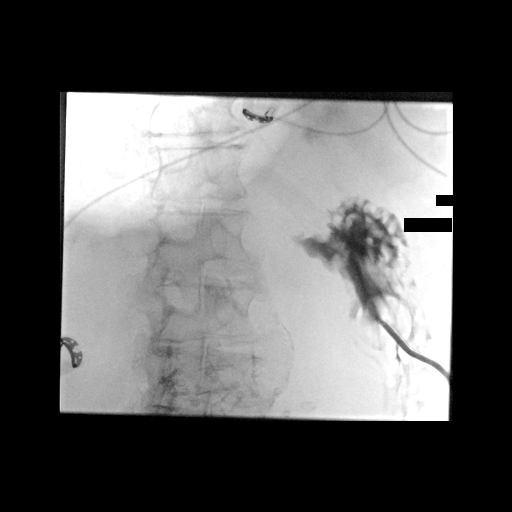
[frame 12/14]
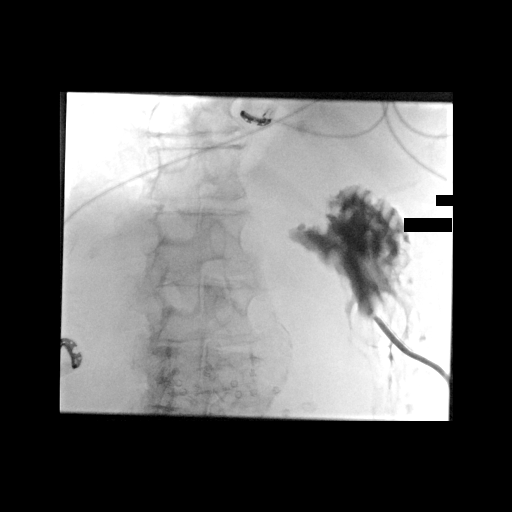
[frame 13/14]
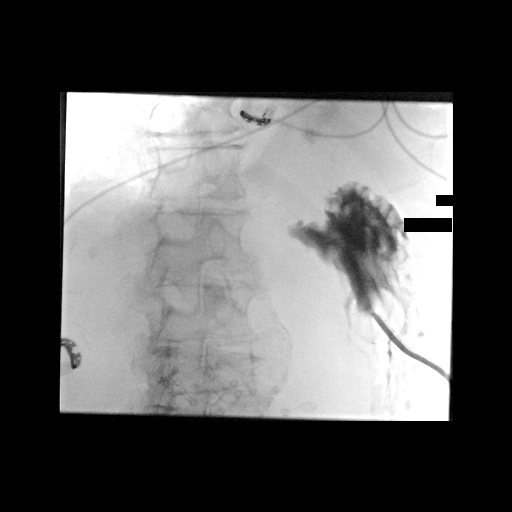

[8 of 8 positions shown; findings below may reference images not displayed]

EXAM:
GASTROSTOMY CATHETER REPLACEMENT

MEDICATIONS:
None.

ANESTHESIA/SEDATION:
Moderate Sedation Time:  None.

The patient was continuously monitored during the procedure by the
interventional radiology nurse under my direct supervision.

CONTRAST:  10 cc - administered into the gastric lumen.

FLUOROSCOPY TIME:  Fluoroscopy Time: 0 minutes 24 seconds (1 mGy).

COMPLICATIONS:
None immediate.

PROCEDURE:
Informed written consent was obtained from the patient's family
after a thorough discussion of the procedural risks, benefits and
alternatives. All questions were addressed. Maximal Sterile Barrier
Technique was utilized including caps, mask, sterile gowns, sterile
gloves, sterile drape, hand hygiene and skin antiseptic. A timeout
was performed prior to the initiation of the procedure.

Under sterile conditions, the existing percutaneous tract in the
left abdomen was cannulated with a catheter and guidewire. Catheter
was advanced into the stomach. Contrast injection confirms position.
Over an Amplatz guidewire, a new 18 French balloon retention
gastrostomy was advanced. The balloon tip was inflated with 9 cc
saline and retracted against the anterior gastric wall. Contrast
injection confirms position. This was secured externally and flushed
with saline. Access ready use.
IMPRESSION: Successful fluoroscopic re- insertion of an 18 French balloon
retention gastrostomy.
